# Patient Record
Sex: Male | Born: 1950
Health system: Southern US, Community
[De-identification: ages and names within clinical notes are randomized; demographics above are authoritative.]

## PROBLEM LIST (undated history)

## (undated) DIAGNOSIS — C61 Malignant neoplasm of prostate: Secondary | ICD-10-CM

## (undated) DIAGNOSIS — K219 Gastro-esophageal reflux disease without esophagitis: Secondary | ICD-10-CM

## (undated) DIAGNOSIS — B1920 Unspecified viral hepatitis C without hepatic coma: Secondary | ICD-10-CM

## (undated) DIAGNOSIS — M1990 Unspecified osteoarthritis, unspecified site: Secondary | ICD-10-CM

## (undated) DIAGNOSIS — I10 Essential (primary) hypertension: Secondary | ICD-10-CM

## (undated) HISTORY — PX: KNEE SURGERY: SHX244

---

## 1997-11-27 HISTORY — PX: BACK SURGERY: SHX140

## 2007-11-25 IMAGING — CT CT HEAD W/O CM - CT MAXILLOFACIAL W/O CM
3 series · 16 of 47 positions shown, 19 images · non-contrast
Comparison: NONE

CLINICAL DATA: Chronic sinusitis. 

CT PARANASAL SINUSES
TECHNIQUE: Multiple axial 3 mm thick slices at 3 mm intervals 
were obtained.  Sagittal and coronal reconstructions were obtained 
as well.

[Series 3: 3x3 · axial · 0.35mm/px · z∈[+974,+1076]mm · 10 of 40 slices shown, 13 images]
[im 3/40  brain]
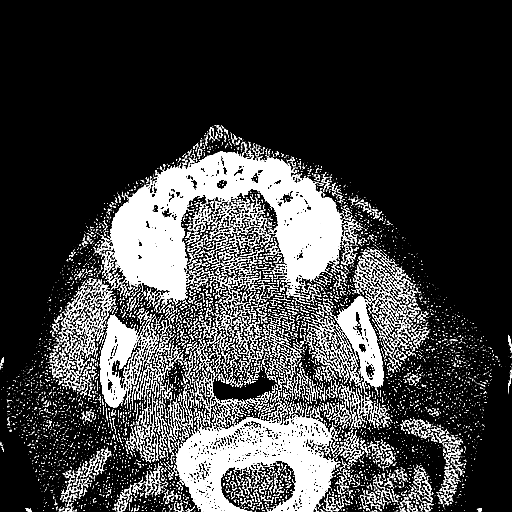
[im 3/40  bone]
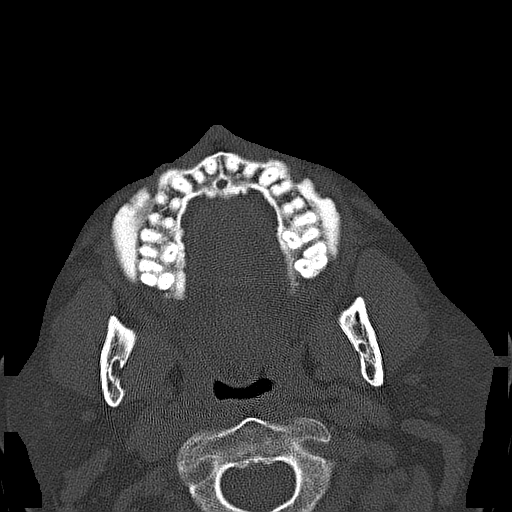
[im 7/40  bone]
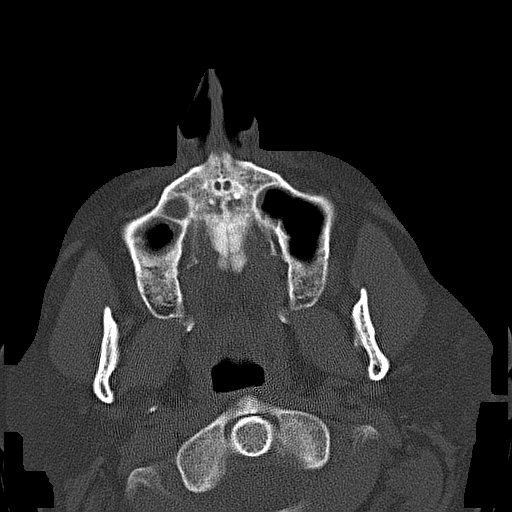
[im 10/40  bone]
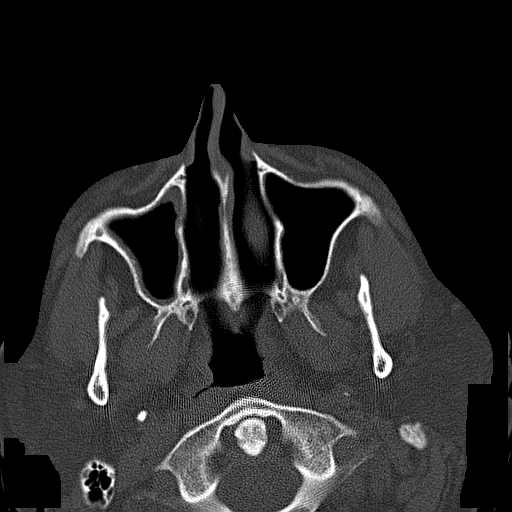
[im 14/40  bone]
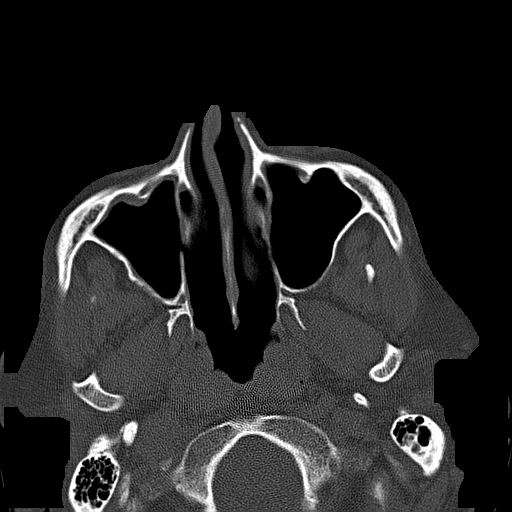
[im 18/40  brain]
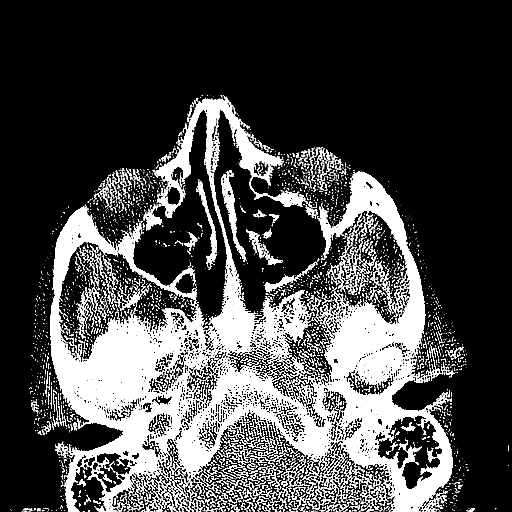
[im 18/40  bone]
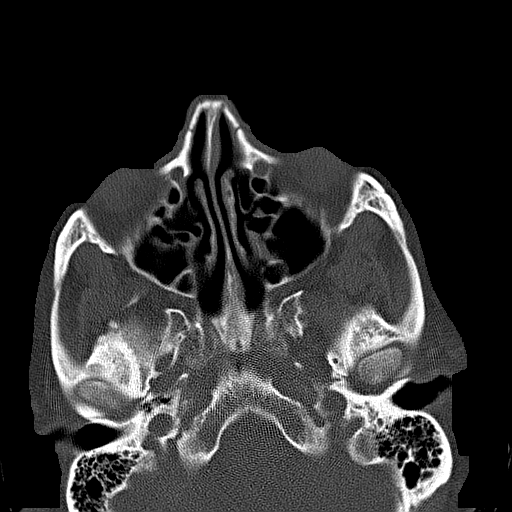
[im 21/40  bone]
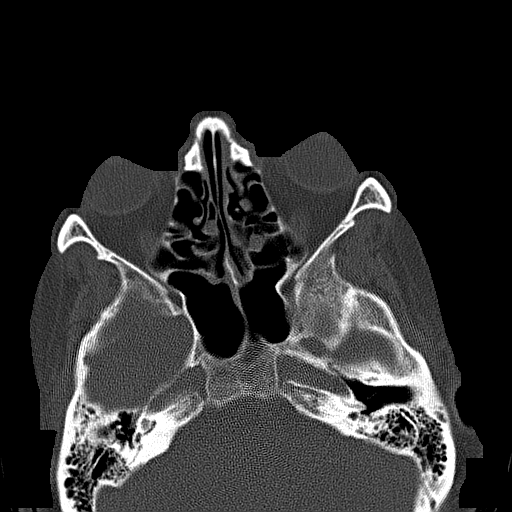
[im 25/40  bone]
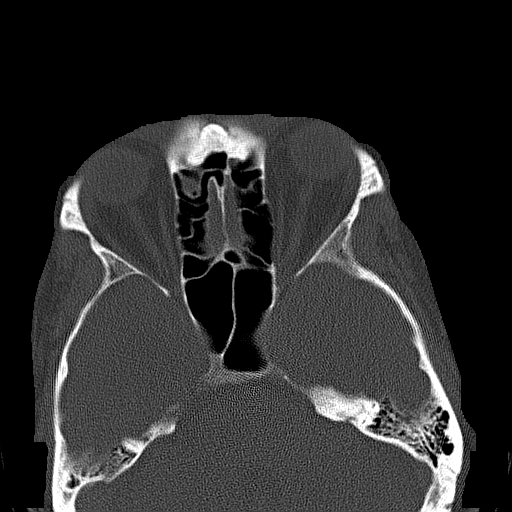
[im 29/40  bone]
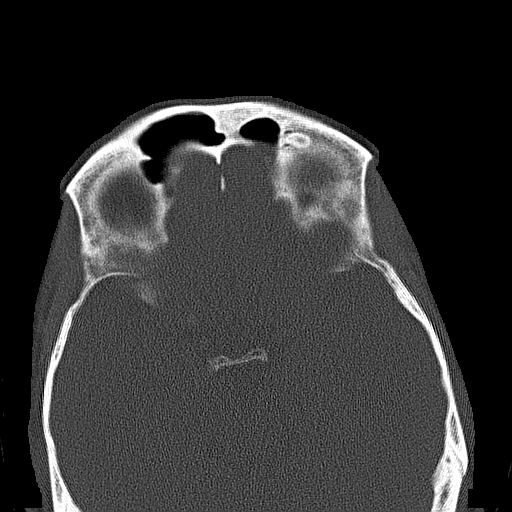
[im 33/40  brain]
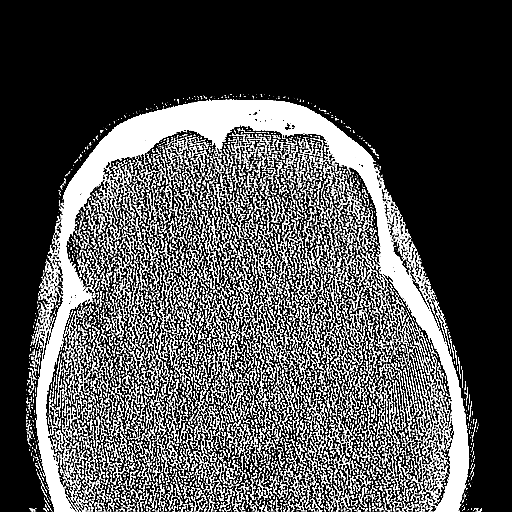
[im 33/40  bone]
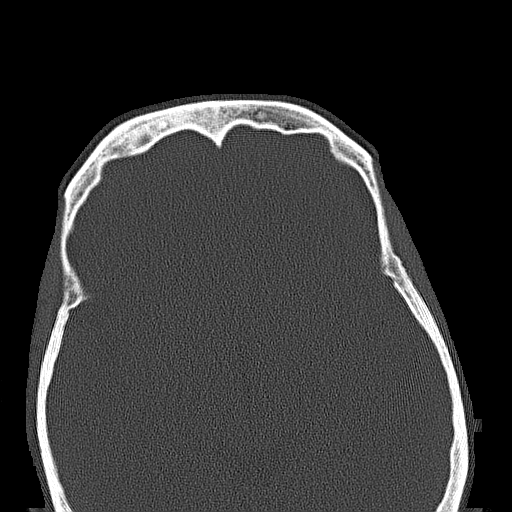
[im 37/40  bone]
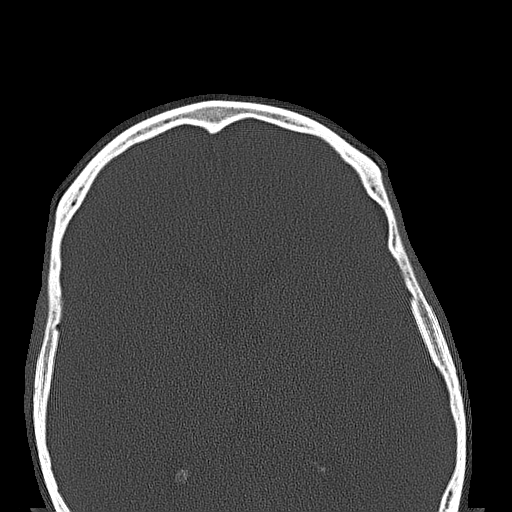

[coronals · coronal · 0.34mm/px · 3 of 33 slices shown]
[im 11/33  bone]
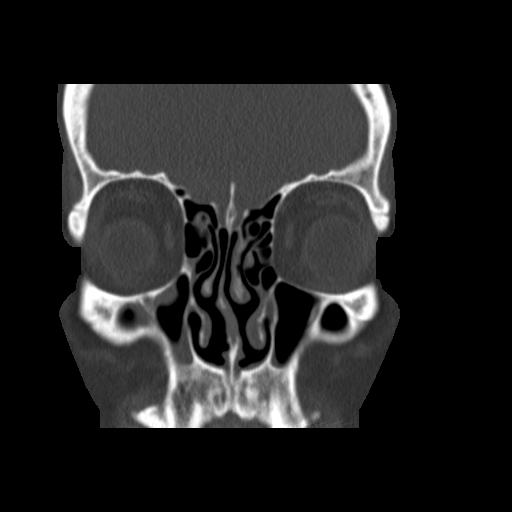
[im 15/33  bone]
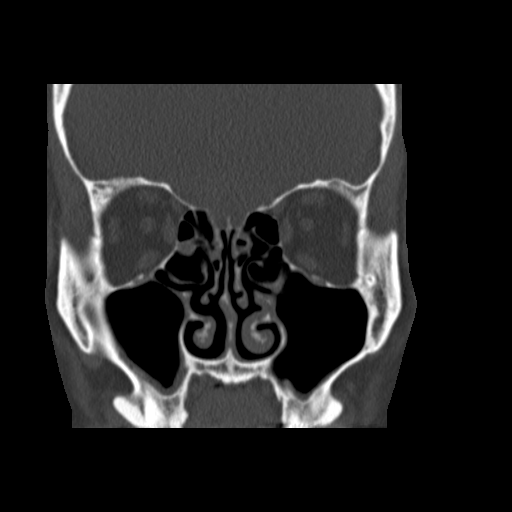
[im 18/33  bone]
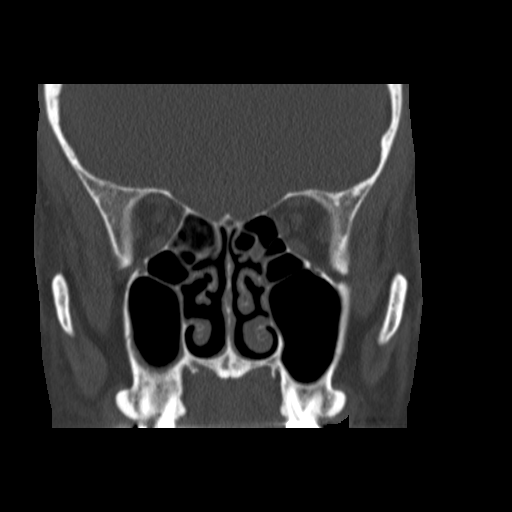

[sagittals · sagittal · 0.34mm/px · 3 of 44 slices shown]
[im 15/44  bone]
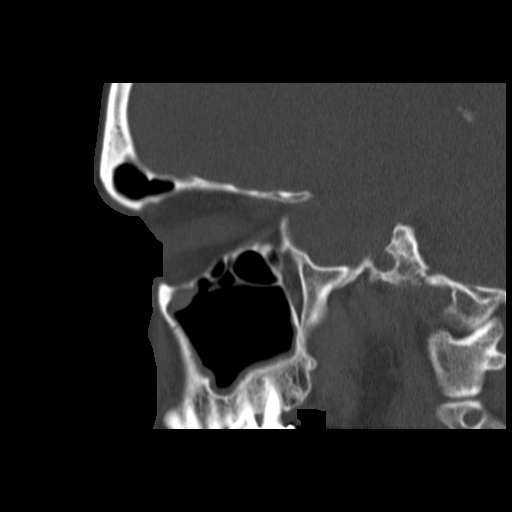
[im 22/44  bone]
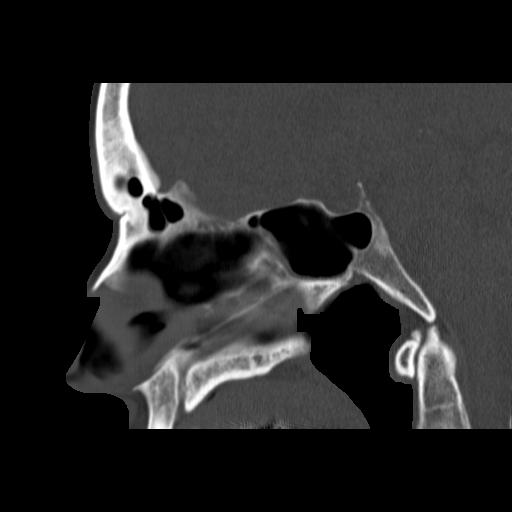
[im 29/44  bone]
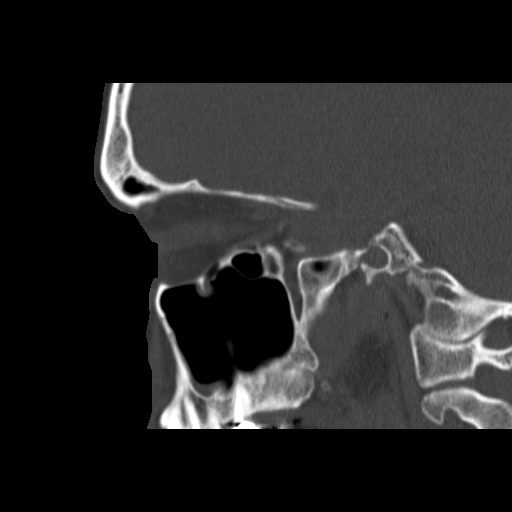

[16 of 47 positions shown; findings below may reference images not displayed]

FINDINGS: There is mild deviation of the nasal septum with the 
apex directed toward the right.  Paradoxical curvature is seen in 
both middle turbinates.  There is mild mucosal thickening noted in 
the maxillary and ethmoid sinuses.  No air-fluid levels are seen.  
No evidence of mucous retention cysts or polypys.
IMPRESSION: Deviation of the nasal septum. There appears to be an 
opening in the medial wall of the right maxillary sinus, which 
likely represents a post-surgical change. Draining infundibula of 
both maxillary sinuses are patent.  There is pneumatization of the 
left uncinate process. No air-fluid levels are seen. Suz Rys, 
Trans Date: 03/15/2007 JH  JLM

## 2012-01-15 ENCOUNTER — Telehealth (HOSPITAL_COMMUNITY): Payer: Self-pay

## 2012-01-15 NOTE — Telephone Encounter (Signed)
error 

## 2014-07-20 ENCOUNTER — Ambulatory Visit (INDEPENDENT_AMBULATORY_CARE_PROVIDER_SITE_OTHER): Payer: Self-pay | Admitting: Family Medicine

## 2014-07-20 VITALS — BP 138/80 | HR 70 | Temp 98.4°F | Resp 18 | Ht 71.0 in | Wt 197.0 lb

## 2014-07-20 DIAGNOSIS — Z0289 Encounter for other administrative examinations: Secondary | ICD-10-CM

## 2014-07-20 NOTE — Progress Notes (Signed)
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 He's having no new problems, takes no medicines, and has no complaints.  Objective: I reviewed the questionnaire which is entirely negative. Patient's physical exam is normal with respect to HEENT, neck, chest, heart, abdomen, genitalia. He has good distal pulses with no edema. His skin is clear of any infections. His vision and hearing reviewed by medical assistant are normal.  Assessment: Patient passes the exam for two-year certificate.

## 2016-07-11 ENCOUNTER — Encounter: Payer: Self-pay | Admitting: Family Medicine

## 2016-07-11 ENCOUNTER — Ambulatory Visit (INDEPENDENT_AMBULATORY_CARE_PROVIDER_SITE_OTHER): Payer: Self-pay | Admitting: Family Medicine

## 2016-07-11 VITALS — BP 136/76 | HR 92 | Temp 98.2°F | Resp 16 | Ht 71.0 in | Wt 208.4 lb

## 2016-07-11 DIAGNOSIS — Z021 Encounter for pre-employment examination: Secondary | ICD-10-CM

## 2016-07-11 DIAGNOSIS — Z024 Encounter for examination for driving license: Secondary | ICD-10-CM

## 2016-07-11 NOTE — Progress Notes (Signed)
Subjective:  By signing my name below, I, Moises Blood, attest that this documentation has been prepared under the direction and in the presence of Merri Ray, MD. Electronically Signed: Moises Blood, [place]. 07/11/2016 , 8:58 AM .  Patient was seen in Room 9 .   Patient ID: Tony Lopez, male    DOB: 1951/02/07, 65 y.o.   MRN: [id number] Chief Complaint  Patient presents with  . dot physical   HPI Tony Lopez is a 65 y.o. male Here for DOT physical. Last DOT physical was in August 2015, given a 2 year card. No medications or medical problems at that time. He denies any new changes since previous visit. He denies chest tightness, shortness of breath or chest pain with exertion. He has been informed of occasional snoring. He denies any day time somnolence. He denies history of sleep apnea. He denies any problems with bright lights or glare at night. He denies glaucoma.   He's had a ruptured disc in his back about 15 years ago. He denies any issue with his back.  He had a torn cartilage in his left knee about 20 years ago; it sometimes has more popping. He denies any weakness in his legs.   There are no active problems to display for this patient.  No past medical history on file. No past surgical history on file. No Known Allergies Prior to Admission medications   Not on File   Social History   Social History  . Marital status: Legally Separated    Spouse name: N/A  . Number of children: N/A  . Years of education: N/A   Occupational History  . Not on file.   Social History Main Topics  . Smoking status: Current Every Day Smoker    Packs/day: 1.00    Years: 35.00    Types: Cigarettes  . Smokeless tobacco: Not on file  . Alcohol use No  . Drug use: No  . Sexual activity: Not on file   Other Topics Concern  . Not on file   Social History Narrative  . No narrative on file    Review of Systems  Constitutional: Negative for fatigue and unexpected  weight change.  Eyes: Negative for visual disturbance.  Respiratory: Negative for cough, chest tightness and shortness of breath.   Cardiovascular: Negative for chest pain, palpitations and leg swelling.  Gastrointestinal: Negative for abdominal pain and blood in stool.  Neurological: Negative for dizziness, light-headedness and headaches.   DOT form reviewed, only positive for history of low back disc issue and left knee meniscus issue. No recent symptoms.    Objective:   Physical Exam  Constitutional: He is oriented to person, place, and time. He appears well-developed and well-nourished.  HENT:  Head: Normocephalic and atraumatic.  Right Ear: External ear normal.  Left Ear: External ear normal.  Mouth/Throat: Oropharynx is clear and moist.  Eyes: Conjunctivae and EOM are normal. Pupils are equal, round, and reactive to light.  Neck: Normal range of motion. Neck supple. No thyromegaly present.  Cardiovascular: Normal rate, regular rhythm, normal heart sounds and intact distal pulses.   Pulmonary/Chest: Effort normal and breath sounds normal. No respiratory distress. He has no wheezes.  Abdominal: Soft. He exhibits no distension. There is no tenderness. Hernia confirmed negative in the right inguinal area and confirmed negative in the left inguinal area.  Musculoskeletal: Normal range of motion. He exhibits no edema or tenderness.  Lymphadenopathy:    He has no cervical adenopathy.  Neurological: He is alert and oriented to person, place, and time. He has normal reflexes.  Skin: Skin is warm and dry.  Psychiatric: He has a normal mood and affect. His behavior is normal.  Vitals reviewed.    Vitals:   07/11/16 0830  BP: 136/76  Pulse: 92  Resp: 16  Temp: 98.2 F (36.8 C)  TempSrc: Oral  SpO2: 96%  Weight: 208 lb 6.4 oz (94.5 kg)  Height: 5\' 11"  (1.803 m)      Assessment & Plan:   Tony Lopez is a 65 y.o. male Encounter for commercial driver medical examination  (CDME)  - No concerning findings on history or exam. 2 year card provided. See paperwork.  No orders of the defined types were placed in this encounter.  Patient Instructions   No concerns on exam today. 2 year card. Keep routine follow-up with your primary care provider.    IF you received an x-ray today, you will receive an invoice from [facility]. Please contact [facility] at [phone] with questions or concerns regarding your invoice.   IF you received labwork today, you will receive an invoice from [employer]. Please contact [facility] at [phone] with questions or concerns regarding your invoice.   Our billing staff will not be able to assist you with questions regarding bills from these companies.  You will be contacted with the lab results as soon as they are available. The fastest way to get your results is to activate your My Chart account. Instructions are located on the last page of this paperwork. If you have not heard from [place] regarding the results in 2 weeks, please contact this office.       I personally performed the services described in this documentation, which was scribed in my presence. The recorded information has been reviewed and considered, and addended by me as needed.   Signed,   Merri Ray, MD [facility].  07/11/16 9:06 AM

## 2016-07-11 NOTE — Patient Instructions (Addendum)
No concerns on exam today. 2 year card. Keep routine follow-up with your primary care provider.    IF you received an x-ray today, you will receive an invoice from [facility]. Please contact [facility] at [phone] with questions or concerns regarding your invoice.   IF you received labwork today, you will receive an invoice from [employer]. Please contact [facility] at [phone] with questions or concerns regarding your invoice.   Our billing staff will not be able to assist you with questions regarding bills from these companies.  You will be contacted with the lab results as soon as they are available. The fastest way to get your results is to activate your My Chart account. Instructions are located on the last page of this paperwork. If you have not heard from [place] regarding the results in 2 weeks, please contact this office.

## 2017-04-11 ENCOUNTER — Encounter: Payer: Self-pay | Admitting: Radiation Oncology

## 2017-04-25 ENCOUNTER — Ambulatory Visit: Payer: Self-pay | Admitting: Radiation Oncology

## 2017-04-26 ENCOUNTER — Encounter: Payer: Self-pay | Admitting: Radiation Oncology

## 2017-04-26 NOTE — Progress Notes (Signed)
GU Location of Tumor / Histology: Prostatic Adenocarcinoma  If Prostate Cancer, Gleason Score is (3 +4 ) on 04/02/2017 and PSA is (6.04) on 01/02/2017  Tony Lopez presented to his primary pcp [place], [place], evaluated PSA found, referred to Dr. Tresa Moore for   Biopsies of prostate on 04/02/2017    Past/Anticipated interventions by urology, if any: Prostate Biopsy  04/02/2017  Past/Anticipated interventions by medical oncology, if any: none  Weight changes, if any: No  Bowel/Bladder complaints, if any: No  Nausea/Vomiting, if any: No  Pain issues, if any:  No  SAFETY ISSUES:  Prior radiation? No  Pacemaker/ICD? No  Possible current pregnancy? No  Is the patient on methotrexate? 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  Per Dr. Zettie Pho note proceed with any treatment in the Fall due to work and better insurance coverage if at all possible.  His pertinent medical history is Hepatitis C, Hypertension, Chronic Kidney Disease Stage 3.  Smokes 1 ppd

## 2017-04-30 ENCOUNTER — Encounter: Payer: Self-pay | Admitting: Radiation Oncology

## 2017-04-30 ENCOUNTER — Encounter: Payer: Self-pay | Admitting: Medical Oncology

## 2017-04-30 ENCOUNTER — Ambulatory Visit
Admission: RE | Admit: 2017-04-30 | Discharge: 2017-04-30 | Disposition: A | Discharge status: Home / Self Care | Payer: BLUE CROSS/BLUE SHIELD | Source: Ambulatory Visit | Attending: Radiation Oncology | Admitting: Radiation Oncology

## 2017-04-30 VITALS — BP 166/90 | HR 60 | Temp 98.8°F | Resp 18 | Wt 208.4 lb

## 2017-04-30 DIAGNOSIS — Z801 Family history of malignant neoplasm of trachea, bronchus and lung: Secondary | ICD-10-CM | POA: Insufficient documentation

## 2017-04-30 DIAGNOSIS — Z79899 Other long term (current) drug therapy: Secondary | ICD-10-CM | POA: Diagnosis not present

## 2017-04-30 DIAGNOSIS — F17210 Nicotine dependence, cigarettes, uncomplicated: Secondary | ICD-10-CM | POA: Insufficient documentation

## 2017-04-30 DIAGNOSIS — K7589 Other specified inflammatory liver diseases: Secondary | ICD-10-CM | POA: Diagnosis not present

## 2017-04-30 DIAGNOSIS — Z9889 Other specified postprocedural states: Secondary | ICD-10-CM | POA: Insufficient documentation

## 2017-04-30 DIAGNOSIS — M1990 Unspecified osteoarthritis, unspecified site: Secondary | ICD-10-CM | POA: Diagnosis not present

## 2017-04-30 DIAGNOSIS — I10 Essential (primary) hypertension: Secondary | ICD-10-CM | POA: Diagnosis not present

## 2017-04-30 DIAGNOSIS — C61 Malignant neoplasm of prostate: Secondary | ICD-10-CM | POA: Insufficient documentation

## 2017-04-30 DIAGNOSIS — K219 Gastro-esophageal reflux disease without esophagitis: Secondary | ICD-10-CM | POA: Insufficient documentation

## 2017-04-30 HISTORY — DX: Unspecified viral hepatitis C without hepatic coma: B19.20

## 2017-04-30 HISTORY — DX: Malignant neoplasm of prostate: C61

## 2017-04-30 HISTORY — DX: Essential (primary) hypertension: I10

## 2017-04-30 HISTORY — DX: Unspecified osteoarthritis, unspecified site: M19.90

## 2017-04-30 HISTORY — DX: Gastro-esophageal reflux disease without esophagitis: K21.9

## 2017-04-30 NOTE — Progress Notes (Signed)
Introduced myself to Mr. Kluttz as the prostate nurse navigator and my role. I gave him my business card and asked him to call me with questions and or concerns. He voiced understanding.

## 2017-04-30 NOTE — Progress Notes (Signed)
Radiation Oncology         [phone] ________________________________  Initial Outpatient Consultation  Name: Tony Lopez MRN: [id number]  Date: 04/30/2017  DOB: 03/08/1951  EX:HBZJIR, Pcp Not In  Alexis Frock, MD   REFERRING PHYSICIAN: Alexis Frock, MD  DIAGNOSIS: 66 y.o. gentleman with stage T1c adenocarcinoma of the prostate with a Gleason score of 3+4 and a PSA of 6.03    ICD-9-CM ICD-10-CM   1. Prostatic adenocarcinoma ([place]) 185 C61     HISTORY OF PRESENT ILLNESS: Tony Lopez is a 66 y.o. male He was noted to have an elevated PSA of 6.04 by his primary care physician, Nonah Mattes, [place] on 01/02/17.  Accordingly, he was referred for evaluation in urology by Dr. Tresa Moore on 04/10/17,  digital rectal examination was performed at that time revealing no nodularity.  The patient proceeded to transrectal ultrasound with 12 biopsies of the prostate on 04/02/17.  The prostate volume measured 53 cc.  Out of 12 core biopsies, 3 were positive.  The maximum Gleason score was 3+4, and this was seen in left base, left mid, and left apex.  The patient reviewed the biopsy results with his urologist and he has kindly been referred today for discussion of potential radiation treatment options.     PREVIOUS RADIATION THERAPY: No  PAST MEDICAL HISTORY:  Past Medical History:  Diagnosis Date  . Arthritis   . GERD (gastroesophageal reflux disease)   . Hepatitis C   . Hypertension   . Prostate cancer ([place])       PAST SURGICAL HISTORY: Past Surgical History:  Procedure Laterality Date  . BACK SURGERY  1999  . KNEE SURGERY Left early 90s    FAMILY HISTORY:  Family History  Problem Relation Age of Onset  . Lung cancer Mother   . Prostate cancer Neg Hx     SOCIAL HISTORY:  Social History   Social History  . Marital status: Legally Separated    Spouse name: N/A  . Number of children: N/A  . Years of education: N/A   Occupational History  . Not on file.    Social History Main Topics  . Smoking status: Current Every Day Smoker    Packs/day: 1.00    Years: 35.00    Types: Cigarettes  . Smokeless tobacco: Not on file  . Alcohol use No  . Drug use: No  . Sexual activity: Not on file   Other Topics Concern  . Not on file   Social History Narrative  . No narrative on file  The patient is divorced and lives in [place]. He works as a Administrator.  ALLERGIES: Patient has no known allergies.  MEDICATIONS:  Current Outpatient Prescriptions  Medication Sig Dispense Refill  . lisinopril-hydrochlorothiazide (PRINZIDE,ZESTORETIC) 20-12.5 MG tablet Take 1 tablet by mouth daily.    Marland Kitchen omeprazole (PRILOSEC) 20 MG capsule Take 20 mg by mouth daily.     No current facility-administered medications for this encounter.     REVIEW OF SYSTEMS:  On review of systems, the patient reports that he is doing well overall. He denies any chest pain, shortness of breath, cough, fevers, chills, night sweats, unintended weight changes. He denies any bowel or bladder disturbances, and denies abdominal pain, nausea or vomiting. He denies any new musculoskeletal or joint aches or pains. The patient has an IPSS score of 0, and reports normal erectile function. A complete review of systems is obtained and is otherwise negative.    PHYSICAL EXAM:  Wt Readings from Last 3 Encounters:  04/30/17 208 lb 6.4 oz (94.5 kg)  07/11/16 208 lb 6.4 oz (94.5 kg)  07/20/14 197 lb (89.4 kg)   Temp Readings from Last 3 Encounters:  04/30/17 98.8 F (37.1 C) (Oral)  07/11/16 98.2 F (36.8 C) (Oral)  07/20/14 98.4 F (36.9 C)   BP Readings from Last 3 Encounters:  04/30/17 (!) 166/90  07/11/16 136/76  07/20/14 138/80   Pulse Readings from Last 3 Encounters:  04/30/17 60  07/11/16 92  07/20/14 70   Pain Assessment Pain Score: 0-No pain/10  In general this is a well appearing caucasian male in no acute distress. He is alert and oriented x4 and appropriate  throughout the examination. HEENT reveals that the patient is normocephalic, atraumatic. EOMs are intact. PERRLA. Skin is intact without any evidence of gross lesions. Cardiovascular exam reveals a regular rate and rhythm, no clicks rubs or murmurs are auscultated. Chest is clear to auscultation bilaterally. Lymphatic assessment is performed and does not reveal any adenopathy in the cervical, supraclavicular, axillary, or inguinal chains. Abdomen has active bowel sounds in all quadrants and is intact. The abdomen is soft, non tender, non distended. Lower extremities are negative for pretibial pitting edema, deep calf tenderness, cyanosis or clubbing.   KPS =0  100 - Normal; no complaints; no evidence of disease. 90   - Able to carry on normal activity; minor signs or symptoms of disease. 80   - Normal activity with effort; some signs or symptoms of disease. 41   - Cares for self; unable to carry on normal activity or to do active work. 60   - Requires occasional assistance, but is able to care for most of his personal needs. 50   - Requires considerable assistance and frequent medical care. 19   - Disabled; requires special care and assistance. 85   - Severely disabled; hospital admission is indicated although death not imminent. 1   - Very sick; hospital admission necessary; active supportive treatment necessary. 10   - Moribund; fatal processes progressing rapidly. 0     - Dead  Karnofsky DA, Abelmann WH, Craver LS and Burchenal JH [phone]) The use of the nitrogen mustards in the palliative treatment of carcinoma: with particular reference to bronchogenic carcinoma Cancer 1 [phone]  LABORATORY DATA:  No results found for: WBC, HGB, HCT, MCV, PLT No results found for: NA, K, CL, CO2 No results found for: ALT, AST, GGT, ALKPHOS, BILITOT   RADIOGRAPHY: No results found.    IMPRESSION/PLAN: 1. 66 y.o. gentleman with intermediate risk T1c adenocarcinoma of the prostate with a Gleason score of  3+4 and a PSA of 6.03. The patient is counseled on the findings from his labs and pathology as well. He reviews the rationale of offering therapies based on T staging, gland size, Gleason Score and PSA. He discusses that given his situation, he would be a candidate for prostatectomy, external radiotherapy, or radioactive seed implant. He discusses the risks, benefits, short, and long term effects of each option of radiotherapy comparing and contrasting them to prostatectomy. He discusses the delivery and logistics of radiotherapy as well. At the conclusion of our discussion he's leaning more toward either brachytherapy with SpaceOAR versus prostatectomy. We will contact him in a few weeks to determine how he would like to move forward.    Carola Rhine, PAC Seen with  _____________________________________  Sheral Apley Tammi Klippel, M.D.    This document serves as a record of services personally  performed by Tyler Pita, MD and Shona Simpson, PA-C. It was created on their behalf by Bethann Humble, a trained medical scribe. The creation of this record is based on the scribe's personal observations and the provider's statements to them. This document has been checked and approved by the attending provider.

## 2017-05-15 ENCOUNTER — Telehealth: Payer: Self-pay | Admitting: Radiation Oncology

## 2017-05-15 NOTE — Telephone Encounter (Signed)
LM for the patient to call me back about treatment decision making.

## 2017-06-06 ENCOUNTER — Telehealth: Payer: Self-pay | Admitting: Radiation Oncology

## 2017-06-06 NOTE — Telephone Encounter (Addendum)
LM to ask the pt to call us regarding treatment decision making since meeting with Dr. Tresa Moore.

## 2017-09-05 ENCOUNTER — Other Ambulatory Visit: Payer: Self-pay | Admitting: Urology

## 2017-09-17 DIAGNOSIS — R278 Other lack of coordination: Secondary | ICD-10-CM | POA: Diagnosis not present

## 2017-09-17 DIAGNOSIS — C61 Malignant neoplasm of prostate: Secondary | ICD-10-CM | POA: Diagnosis not present

## 2017-09-17 DIAGNOSIS — M6281 Muscle weakness (generalized): Secondary | ICD-10-CM | POA: Diagnosis not present

## 2017-09-24 DIAGNOSIS — M6281 Muscle weakness (generalized): Secondary | ICD-10-CM | POA: Diagnosis not present

## 2017-09-24 DIAGNOSIS — M62838 Other muscle spasm: Secondary | ICD-10-CM | POA: Diagnosis not present

## 2017-09-24 DIAGNOSIS — C61 Malignant neoplasm of prostate: Secondary | ICD-10-CM | POA: Diagnosis not present

## 2017-09-24 DIAGNOSIS — N183 Chronic kidney disease, stage 3 (moderate): Secondary | ICD-10-CM | POA: Diagnosis not present

## 2017-09-24 DIAGNOSIS — N393 Stress incontinence (female) (male): Secondary | ICD-10-CM | POA: Diagnosis not present

## 2017-09-28 NOTE — Patient Instructions (Addendum)
Tony Lopez  09/28/2017   Your procedure is scheduled on: 10-05-17  Report to [facility] Main  Entrance [facility]  elevators to 3rd floor to  [place] at 9:15AM.   Call this number if you have problems the morning of surgery [phone]    Remember: ONLY 1 PERSON MAY GO WITH YOU TO SHORT STAY TO GET  READY MORNING OF [employer].  Do not eat food fter Midnight on Wednesday 10-03-17. Drink plenty of clear liquids all day Thursday 10-04-17 and follow all bowel prep instructions from your surgeon. nothing by mouth after midnight on Thursday!!     Take these medicines the morning of surgery with A SIP OF WATER: omeprazole(priliosec)                                 You may not have any metal on your body including hair pins and              piercings  Do not wear jewelry, make-up, lotions, powders or perfumes, deodorant                      Men may shave face and neck.   Do not bring valuables to the hospital. [place].  Contacts, dentures or bridgework may not be worn into surgery.  Leave suitcase in the car. After surgery it may be brought to your room.                Please read over the following fact sheets you were given: _____________________________________________________________________    CLEAR LIQUID DIET   Foods Allowed                                                                     Foods Excluded  Coffee and tea, regular and decaf                             liquids that you cannot  Plain Jell-O in any flavor                                             see through such as: Fruit ices (not with fruit pulp)                                     milk, soups, orange juice  Iced Popsicles                                    All solid food Carbonated beverages, regular and diet  Cranberry, grape and apple juices Sports drinks like  Gatorade Lightly seasoned clear broth or consume(fat free) Sugar, honey syrup  Sample Menu Breakfast                                Lunch                                     Supper Cranberry juice                    Beef broth                            Chicken broth Jell-O                                     Grape juice                           Apple juice Coffee or tea                        Jell-O                                      Popsicle                                                Coffee or tea                        Coffee or tea  _____________________________________________________________________  [facility] - Preparing for Surgery Before surgery, you can play an important role.  Because skin is not sterile, your skin needs to be as free of germs as possible.  You can reduce the number of germs on your skin by washing with CHG (chlorahexidine gluconate) soap before surgery.  CHG is an antiseptic cleaner which kills germs and bonds with the skin to continue killing germs even after washing. Please DO NOT use if you have an allergy to CHG or antibacterial soaps.  If your skin becomes reddened/irritated stop using the CHG and inform your nurse when you arrive at Short Stay. Do not shave (including legs and underarms) for at least 48 hours prior to the first CHG shower.  You may shave your face/neck. Please follow these instructions carefully:  1.  Shower with CHG Soap the night before surgery and the  morning of Surgery.  2.  If you choose to wash your hair, wash your hair first as usual with your  normal  shampoo.  3.  After you shampoo, rinse your hair and body thoroughly to remove the  shampoo.                           4.  Use CHG as you would any other liquid soap.  You can apply chg directly  to the skin and wash  Gently with a scrungie or clean washcloth.  5.  Apply the CHG Soap to your body ONLY FROM THE NECK DOWN.   Do not use on face/ open                            Wound or open sores. Avoid contact with eyes, ears mouth and genitals (private parts).                       Wash face,  Genitals (private parts) with your normal soap.             6.  Wash thoroughly, paying special attention to the area where your surgery  will be performed.  7.  Thoroughly rinse your body with warm water from the neck down.  8.  DO NOT shower/wash with your normal soap after using and rinsing off  the CHG Soap.                9.  Pat yourself dry with a clean towel.            10.  Wear clean pajamas.            11.  Place clean sheets on your bed the night of your first shower and do not  sleep with pets. Day of Surgery : Do not apply any lotions/deodorants the morning of surgery.  Please wear clean clothes to the hospital/surgery center.  FAILURE TO FOLLOW THESE INSTRUCTIONS MAY RESULT IN THE CANCELLATION OF YOUR SURGERY PATIENT SIGNATURE_________________________________  NURSE SIGNATURE__________________________________  ________________________________________________________________________

## 2017-10-01 ENCOUNTER — Encounter (HOSPITAL_COMMUNITY)
Admission: RE | Admit: 2017-10-01 | Discharge: 2017-10-01 | Disposition: A | Discharge status: Home / Self Care | Payer: BLUE CROSS/BLUE SHIELD | Source: Ambulatory Visit | Attending: Urology | Admitting: Urology

## 2017-10-01 ENCOUNTER — Encounter (HOSPITAL_COMMUNITY): Payer: Self-pay

## 2017-10-01 ENCOUNTER — Other Ambulatory Visit: Payer: Self-pay

## 2017-10-01 DIAGNOSIS — C61 Malignant neoplasm of prostate: Secondary | ICD-10-CM | POA: Diagnosis not present

## 2017-10-01 DIAGNOSIS — Z01810 Encounter for preprocedural cardiovascular examination: Secondary | ICD-10-CM | POA: Diagnosis not present

## 2017-10-01 DIAGNOSIS — Z01818 Encounter for other preprocedural examination: Secondary | ICD-10-CM | POA: Diagnosis not present

## 2017-10-01 DIAGNOSIS — I10 Essential (primary) hypertension: Secondary | ICD-10-CM | POA: Insufficient documentation

## 2017-10-01 LAB — COMPREHENSIVE METABOLIC PANEL
ALBUMIN: 4.4 g/dL (ref 3.5–5.0)
ALT: 22 U/L (ref 17–63)
AST: 18 U/L (ref 15–41)
Alkaline Phosphatase: 69 U/L (ref 38–126)
Anion gap: 9 (ref 5–15)
BILIRUBIN TOTAL: 0.7 mg/dL (ref 0.3–1.2)
BUN: 14 mg/dL (ref 6–20)
CHLORIDE: 102 mmol/L (ref 101–111)
CO2: 27 mmol/L (ref 22–32)
CREATININE: 1.31 mg/dL — AB (ref 0.61–1.24)
Calcium: 9.7 mg/dL (ref 8.9–10.3)
GFR calc Af Amer: >60 mL/min (ref >60)
GFR calc non Af Amer: 56 mL/min — ABNORMAL LOW (ref >60)
GLUCOSE: 91 mg/dL (ref 65–99)
POTASSIUM: 4.1 mmol/L (ref 3.5–5.1)
Sodium: 138 mmol/L (ref 135–145)
TOTAL PROTEIN: 7.3 g/dL (ref 6.5–8.1)

## 2017-10-01 LAB — CBC
HEMATOCRIT: 47.4 % (ref 39.0–52.0)
Hemoglobin: 17 g/dL (ref 13.0–17.0)
MCH: 33.1 pg (ref 26.0–34.0)
MCHC: 35.9 g/dL (ref 30.0–36.0)
MCV: 92.2 fL (ref 78.0–100.0)
Platelets: 300 10*3/uL (ref 150–400)
RBC: 5.14 MIL/uL (ref 4.22–5.81)
RDW: 12.1 % (ref 11.5–15.5)
WBC: 14.9 10*3/uL — ABNORMAL HIGH (ref 4.0–10.5)

## 2017-10-01 NOTE — Progress Notes (Signed)
CBC result routed via epic to Dr Alexis Frock and to patients PCP

## 2017-10-05 ENCOUNTER — Other Ambulatory Visit: Payer: Self-pay

## 2017-10-05 ENCOUNTER — Ambulatory Visit (HOSPITAL_COMMUNITY): Payer: BLUE CROSS/BLUE SHIELD | Admitting: Certified Registered Nurse Anesthetist

## 2017-10-05 ENCOUNTER — Observation Stay (HOSPITAL_COMMUNITY)
Admission: RE | Admit: 2017-10-05 | Discharge: 2017-10-07 | Disposition: A | Discharge status: Home / Self Care | Payer: BLUE CROSS/BLUE SHIELD | Source: Ambulatory Visit | Attending: Urology | Admitting: Urology

## 2017-10-05 ENCOUNTER — Encounter: Payer: Self-pay | Admitting: Medical Oncology

## 2017-10-05 ENCOUNTER — Encounter (HOSPITAL_COMMUNITY): Payer: Self-pay | Admitting: Emergency Medicine

## 2017-10-05 ENCOUNTER — Encounter (HOSPITAL_COMMUNITY)
Admission: RE | Disposition: A | Discharge status: Home / Self Care | Payer: Self-pay | Source: Ambulatory Visit | Attending: Urology

## 2017-10-05 DIAGNOSIS — K219 Gastro-esophageal reflux disease without esophagitis: Secondary | ICD-10-CM | POA: Diagnosis not present

## 2017-10-05 DIAGNOSIS — Z79899 Other long term (current) drug therapy: Secondary | ICD-10-CM | POA: Diagnosis not present

## 2017-10-05 DIAGNOSIS — M1990 Unspecified osteoarthritis, unspecified site: Secondary | ICD-10-CM | POA: Diagnosis not present

## 2017-10-05 DIAGNOSIS — B1920 Unspecified viral hepatitis C without hepatic coma: Secondary | ICD-10-CM | POA: Diagnosis not present

## 2017-10-05 DIAGNOSIS — C61 Malignant neoplasm of prostate: Secondary | ICD-10-CM | POA: Diagnosis not present

## 2017-10-05 DIAGNOSIS — I10 Essential (primary) hypertension: Secondary | ICD-10-CM | POA: Insufficient documentation

## 2017-10-05 DIAGNOSIS — F17210 Nicotine dependence, cigarettes, uncomplicated: Secondary | ICD-10-CM | POA: Diagnosis not present

## 2017-10-05 HISTORY — PX: CYSTOSCOPY WITH INJECTION: SHX1424

## 2017-10-05 HISTORY — PX: ROBOT ASSISTED LAPAROSCOPIC RADICAL PROSTATECTOMY: SHX5141

## 2017-10-05 HISTORY — PX: LYMPHADENECTOMY: SHX5960

## 2017-10-05 LAB — HEMOGLOBIN AND HEMATOCRIT, BLOOD
HCT: 43.1 % (ref 39.0–52.0)
HEMOGLOBIN: 15 g/dL (ref 13.0–17.0)

## 2017-10-05 SURGERY — PROSTATECTOMY, RADICAL, ROBOT-ASSISTED, LAPAROSCOPIC
Anesthesia: General

## 2017-10-05 MED ORDER — SULFAMETHOXAZOLE-TRIMETHOPRIM 800-160 MG PO TABS: 1.0000 | ORAL_TABLET | Freq: Two times a day (BID) | ORAL | 0 refills | Status: DC

## 2017-10-05 MED ORDER — DIPHENHYDRAMINE HCL 50 MG/ML IJ SOLN: 12.5000 mg | Freq: Four times a day (QID) | INTRAMUSCULAR | Status: DC | PRN

## 2017-10-05 MED ORDER — HYDROMORPHONE HCL 1 MG/ML IJ SOLN: 0.5000 mg | INTRAMUSCULAR | Status: DC | PRN

## 2017-10-05 MED ORDER — DIPHENHYDRAMINE HCL 12.5 MG/5ML PO ELIX: 12.5000 mg | ORAL_SOLUTION | Freq: Four times a day (QID) | ORAL | Status: DC | PRN

## 2017-10-05 MED ORDER — DEXAMETHASONE SODIUM PHOSPHATE 10 MG/ML IJ SOLN
INTRAMUSCULAR | Status: DC | PRN
  Administered 2017-10-05: 10 mg via INTRAVENOUS

## 2017-10-05 MED ORDER — ACETAMINOPHEN 500 MG PO TABS
1000.0000 mg | ORAL_TABLET | Freq: Four times a day (QID) | ORAL | Status: AC
  Administered 2017-10-05 – 2017-10-06 (×4): 1000 mg via ORAL
  Filled 2017-10-05 (×4): qty 2

## 2017-10-05 MED ORDER — MIDAZOLAM HCL 5 MG/5ML IJ SOLN
INTRAMUSCULAR | Status: DC | PRN
  Administered 2017-10-05 (×2): 1 mg via INTRAVENOUS

## 2017-10-05 MED ORDER — LIDOCAINE 2% (20 MG/ML) 5 ML SYRINGE
INTRAMUSCULAR | Status: AC
  Filled 2017-10-05: qty 5

## 2017-10-05 MED ORDER — CEFAZOLIN SODIUM-DEXTROSE 2-4 GM/100ML-% IV SOLN
2.0000 g | INTRAVENOUS | Status: AC
  Administered 2017-10-05: 2 g via INTRAVENOUS
  Filled 2017-10-05: qty 100

## 2017-10-05 MED ORDER — SODIUM CHLORIDE 0.9 % IV BOLUS (SEPSIS)
1000.0000 mL | Freq: Once | INTRAVENOUS | Status: AC
  Administered 2017-10-05: 1000 mL via INTRAVENOUS

## 2017-10-05 MED ORDER — PROPOFOL 10 MG/ML IV BOLUS
INTRAVENOUS | Status: AC
  Filled 2017-10-05: qty 20

## 2017-10-05 MED ORDER — OXYCODONE HCL 5 MG PO TABS
5.0000 mg | ORAL_TABLET | ORAL | Status: DC | PRN
  Administered 2017-10-06 – 2017-10-07 (×3): 5 mg via ORAL
  Filled 2017-10-05 (×3): qty 1

## 2017-10-05 MED ORDER — ONDANSETRON HCL 4 MG/2ML IJ SOLN
INTRAMUSCULAR | Status: AC
  Filled 2017-10-05: qty 2

## 2017-10-05 MED ORDER — SODIUM CHLORIDE 0.9 % IJ SOLN
INTRAMUSCULAR | Status: AC
  Filled 2017-10-05: qty 50

## 2017-10-05 MED ORDER — HYDROMORPHONE HCL 1 MG/ML IJ SOLN
INTRAMUSCULAR | Status: DC | PRN
  Administered 2017-10-05 (×4): 0.5 mg via INTRAVENOUS

## 2017-10-05 MED ORDER — FENTANYL CITRATE (PF) 250 MCG/5ML IJ SOLN
INTRAMUSCULAR | Status: AC
  Filled 2017-10-05: qty 5

## 2017-10-05 MED ORDER — DEXAMETHASONE SODIUM PHOSPHATE 10 MG/ML IJ SOLN
INTRAMUSCULAR | Status: AC
  Filled 2017-10-05: qty 1

## 2017-10-05 MED ORDER — FENTANYL CITRATE (PF) 100 MCG/2ML IJ SOLN
INTRAMUSCULAR | Status: AC
  Filled 2017-10-05: qty 2

## 2017-10-05 MED ORDER — MIDAZOLAM HCL 2 MG/2ML IJ SOLN
INTRAMUSCULAR | Status: AC
  Filled 2017-10-05: qty 2

## 2017-10-05 MED ORDER — HYDROMORPHONE HCL 1 MG/ML IJ SOLN
0.2500 mg | INTRAMUSCULAR | Status: DC | PRN
  Administered 2017-10-05 (×2): 0.5 mg via INTRAVENOUS

## 2017-10-05 MED ORDER — ONDANSETRON HCL 4 MG/2ML IJ SOLN: 4.0000 mg | INTRAMUSCULAR | Status: DC | PRN

## 2017-10-05 MED ORDER — SUGAMMADEX SODIUM 200 MG/2ML IV SOLN
INTRAVENOUS | Status: DC | PRN
  Administered 2017-10-05: 200 mg via INTRAVENOUS

## 2017-10-05 MED ORDER — LISINOPRIL-HYDROCHLOROTHIAZIDE 20-12.5 MG PO TABS: 1.0000 | ORAL_TABLET | Freq: Every day | ORAL | Status: DC

## 2017-10-05 MED ORDER — LIDOCAINE 2% (20 MG/ML) 5 ML SYRINGE
INTRAMUSCULAR | Status: DC | PRN
  Administered 2017-10-05: 100 mg via INTRAVENOUS

## 2017-10-05 MED ORDER — ONDANSETRON HCL 4 MG/2ML IJ SOLN
INTRAMUSCULAR | Status: DC | PRN
  Administered 2017-10-05: 4 mg via INTRAVENOUS

## 2017-10-05 MED ORDER — LISINOPRIL 20 MG PO TABS
20.0000 mg | ORAL_TABLET | Freq: Every day | ORAL | Status: DC
  Administered 2017-10-06 – 2017-10-07 (×2): 20 mg via ORAL
  Filled 2017-10-05 (×2): qty 1

## 2017-10-05 MED ORDER — LACTATED RINGERS IR SOLN
Status: DC | PRN
  Administered 2017-10-05: 1000 mL

## 2017-10-05 MED ORDER — MEPERIDINE HCL 50 MG/ML IJ SOLN: 6.2500 mg | INTRAMUSCULAR | Status: DC | PRN

## 2017-10-05 MED ORDER — PROPOFOL 10 MG/ML IV BOLUS
INTRAVENOUS | Status: DC | PRN
  Administered 2017-10-05: 150 mg via INTRAVENOUS

## 2017-10-05 MED ORDER — HYDROMORPHONE HCL 2 MG/ML IJ SOLN
INTRAMUSCULAR | Status: AC
  Filled 2017-10-05: qty 1

## 2017-10-05 MED ORDER — OXYCODONE HCL 5 MG PO TABS: 5.0000 mg | ORAL_TABLET | Freq: Once | ORAL | Status: DC | PRN

## 2017-10-05 MED ORDER — PHENYLEPHRINE HCL 10 MG/ML IJ SOLN
INTRAMUSCULAR | Status: DC | PRN
  Administered 2017-10-05 (×6): 80 ug via INTRAVENOUS
  Administered 2017-10-05: 120 ug via INTRAVENOUS

## 2017-10-05 MED ORDER — PNEUMOCOCCAL VAC POLYVALENT 25 MCG/0.5ML IJ INJ
0.5000 mL | INJECTION | INTRAMUSCULAR | Status: DC
  Filled 2017-10-05: qty 0.5

## 2017-10-05 MED ORDER — ROCURONIUM BROMIDE 50 MG/5ML IV SOSY
PREFILLED_SYRINGE | INTRAVENOUS | Status: AC
  Filled 2017-10-05: qty 5

## 2017-10-05 MED ORDER — SODIUM CHLORIDE 0.9 % IJ SOLN
INTRAMUSCULAR | Status: DC | PRN
  Administered 2017-10-05: 20 mL

## 2017-10-05 MED ORDER — FENTANYL CITRATE (PF) 100 MCG/2ML IJ SOLN
INTRAMUSCULAR | Status: DC | PRN
  Administered 2017-10-05: 100 ug via INTRAVENOUS
  Administered 2017-10-05 (×4): 50 ug via INTRAVENOUS

## 2017-10-05 MED ORDER — MAGNESIUM CITRATE PO SOLN
1.0000 | Freq: Once | ORAL | Status: DC
  Filled 2017-10-05: qty 296

## 2017-10-05 MED ORDER — LACTATED RINGERS IV SOLN
INTRAVENOUS | Status: DC
  Administered 2017-10-05 (×2): via INTRAVENOUS

## 2017-10-05 MED ORDER — OXYCODONE HCL 5 MG/5ML PO SOLN
5.0000 mg | Freq: Once | ORAL | Status: DC | PRN
  Filled 2017-10-05: qty 5

## 2017-10-05 MED ORDER — ROCURONIUM BROMIDE 50 MG/5ML IV SOSY
PREFILLED_SYRINGE | INTRAVENOUS | Status: DC | PRN
  Administered 2017-10-05: 50 mg via INTRAVENOUS
  Administered 2017-10-05 (×4): 20 mg via INTRAVENOUS

## 2017-10-05 MED ORDER — INFLUENZA VAC SPLIT HIGH-DOSE 0.5 ML IM SUSY
0.5000 mL | PREFILLED_SYRINGE | INTRAMUSCULAR | Status: DC
  Filled 2017-10-05: qty 0.5

## 2017-10-05 MED ORDER — BUPIVACAINE LIPOSOME 1.3 % IJ SUSP
20.0000 mL | Freq: Once | INTRAMUSCULAR | Status: AC
  Administered 2017-10-05: 20 mL
  Filled 2017-10-05: qty 20

## 2017-10-05 MED ORDER — HYDROCODONE-ACETAMINOPHEN 5-325 MG PO TABS: 1.0000 | ORAL_TABLET | Freq: Four times a day (QID) | ORAL | 0 refills | Status: DC | PRN

## 2017-10-05 MED ORDER — PROMETHAZINE HCL 25 MG/ML IJ SOLN: 6.2500 mg | INTRAMUSCULAR | Status: DC | PRN

## 2017-10-05 MED ORDER — DEXTROSE-NACL 5-0.45 % IV SOLN
INTRAVENOUS | Status: DC
  Administered 2017-10-05 – 2017-10-06 (×2): via INTRAVENOUS

## 2017-10-05 MED ORDER — FAMOTIDINE 20 MG PO TABS
20.0000 mg | ORAL_TABLET | Freq: Two times a day (BID) | ORAL | Status: DC
Start: 2017-10-05 — End: 2017-10-07
  Administered 2017-10-05 – 2017-10-07 (×4): 20 mg via ORAL
  Filled 2017-10-05 (×4): qty 1

## 2017-10-05 MED ORDER — HYDROMORPHONE HCL 1 MG/ML IJ SOLN
INTRAMUSCULAR | Status: AC
  Filled 2017-10-05: qty 1

## 2017-10-05 MED ORDER — HYDROCHLOROTHIAZIDE 12.5 MG PO CAPS
12.5000 mg | ORAL_CAPSULE | Freq: Every day | ORAL | Status: DC
  Administered 2017-10-06 – 2017-10-07 (×2): 12.5 mg via ORAL
  Filled 2017-10-05 (×2): qty 1

## 2017-10-05 SURGICAL SUPPLY — 73 items
ADH SKN CLS APL DERMABOND .7 (GAUZE/BANDAGES/DRESSINGS) ×2
APPLICATOR COTTON TIP 6IN STRL (MISCELLANEOUS) ×4 IMPLANT
BAG URO CATCHER STRL LF (MISCELLANEOUS) ×4 IMPLANT
CATH FOLEY 2WAY SLVR 18FR 30CC (CATHETERS) ×4 IMPLANT
CATH TIEMANN FOLEY 18FR 5CC (CATHETERS) ×4 IMPLANT
CHLORAPREP W/TINT 26ML (MISCELLANEOUS) ×4 IMPLANT
CLIP VESOLOCK LG 6/CT PURPLE (CLIP) ×8 IMPLANT
CLOTH BEACON ORANGE TIMEOUT ST (SAFETY) ×4 IMPLANT
CONT SPEC 4OZ CLIKSEAL STRL BL (MISCELLANEOUS) ×4 IMPLANT
COVER FOOTSWITCH UNIV (MISCELLANEOUS) IMPLANT
COVER SURGICAL LIGHT HANDLE (MISCELLANEOUS) ×4 IMPLANT
COVER TIP SHEARS 8 DVNC (MISCELLANEOUS) ×2 IMPLANT
COVER TIP SHEARS 8MM DA VINCI (MISCELLANEOUS) ×2
CUTTER ECHEON FLEX ENDO 45 340 (ENDOMECHANICALS) ×4 IMPLANT
DECANTER SPIKE VIAL GLASS SM (MISCELLANEOUS) ×2 IMPLANT
DERMABOND ADVANCED (GAUZE/BANDAGES/DRESSINGS) ×2
DERMABOND ADVANCED .7 DNX12 (GAUZE/BANDAGES/DRESSINGS) ×2 IMPLANT
DRAPE ARM DVNC X/XI (DISPOSABLE) ×8 IMPLANT
DRAPE COLUMN DVNC XI (DISPOSABLE) ×2 IMPLANT
DRAPE DA VINCI XI ARM (DISPOSABLE) ×8
DRAPE DA VINCI XI COLUMN (DISPOSABLE) ×2
DRAPE SURG IRRIG POUCH 19X23 (DRAPES) ×4 IMPLANT
DRSG TEGADERM 4X4.75 (GAUZE/BANDAGES/DRESSINGS) ×4 IMPLANT
ELECT REM PT RETURN 15FT ADLT (MISCELLANEOUS) ×4 IMPLANT
GAUZE SPONGE 2X2 8PLY STRL LF (GAUZE/BANDAGES/DRESSINGS) ×2 IMPLANT
GLOVE BIO SURGEON STRL SZ 6.5 (GLOVE) ×3 IMPLANT
GLOVE BIO SURGEONS STRL SZ 6.5 (GLOVE) ×1
GLOVE BIOGEL M STRL SZ7.5 (GLOVE) ×8 IMPLANT
GLOVE BIOGEL PI IND STRL 7.5 (GLOVE) ×2 IMPLANT
GLOVE BIOGEL PI INDICATOR 7.5 (GLOVE) ×2
GOWN STRL REUS W/TWL LRG LVL3 (GOWN DISPOSABLE) ×12 IMPLANT
GOWN STRL REUS W/TWL XL LVL3 (GOWN DISPOSABLE) ×4 IMPLANT
HOLDER FOLEY CATH W/STRAP (MISCELLANEOUS) ×4 IMPLANT
IRRIG SUCT STRYKERFLOW 2 WTIP (MISCELLANEOUS) ×4
IRRIGATION SUCT STRKRFLW 2 WTP (MISCELLANEOUS) ×2 IMPLANT
IV LACTATED RINGERS 1000ML (IV SOLUTION) ×4 IMPLANT
KIT PROCEDURE DA VINCI SI (MISCELLANEOUS) ×2
KIT PROCEDURE DVNC SI (MISCELLANEOUS) ×2 IMPLANT
MANIFOLD NEPTUNE II (INSTRUMENTS) ×4 IMPLANT
NDL INSUFFLATION 14GA 120MM (NEEDLE) ×2 IMPLANT
NDL SPNL 22GX7 QUINCKE BK (NEEDLE) ×2 IMPLANT
NEEDLE INSUFFLATION 14GA 120MM (NEEDLE) ×4 IMPLANT
NEEDLE SPNL 22GX7 QUINCKE BK (NEEDLE) ×4 IMPLANT
PACK CYSTO (CUSTOM PROCEDURE TRAY) ×4 IMPLANT
PACK ROBOT UROLOGY CUSTOM (CUSTOM PROCEDURE TRAY) ×4 IMPLANT
PAD POSITIONING PINK XL (MISCELLANEOUS) ×2 IMPLANT
PORT ACCESS TROCAR AIRSEAL 12 (TROCAR) ×2 IMPLANT
PORT ACCESS TROCAR AIRSEAL 5M (TROCAR) ×2
RELOAD STAPLE 45 4.1 GRN THCK (STAPLE) ×2 IMPLANT
SEAL CANN UNIV 5-8 DVNC XI (MISCELLANEOUS) ×8 IMPLANT
SEAL XI 5MM-8MM UNIVERSAL (MISCELLANEOUS) ×8
SET TRI-LUMEN FLTR TB AIRSEAL (TUBING) ×4 IMPLANT
SOLUTION ELECTROLUBE (MISCELLANEOUS) ×4 IMPLANT
SPONGE GAUZE 2X2 STER 10/PKG (GAUZE/BANDAGES/DRESSINGS)
SPONGE LAP 4X18 X RAY DECT (DISPOSABLE) ×4 IMPLANT
STAPLE RELOAD 45 GRN (STAPLE) ×2 IMPLANT
STAPLE RELOAD 45MM GREEN (STAPLE) ×4
SUT ETHILON 3 0 PS 1 (SUTURE) ×4 IMPLANT
SUT MNCRL AB 4-0 PS2 18 (SUTURE) ×8 IMPLANT
SUT PDS AB 1 CT1 27 (SUTURE) ×8 IMPLANT
SUT VIC AB 2-0 SH 27 (SUTURE) ×4
SUT VIC AB 2-0 SH 27X BRD (SUTURE) ×2 IMPLANT
SUT VICRYL 0 UR6 27IN ABS (SUTURE) ×4 IMPLANT
SUT VLOC BARB 180 ABS3/0GR12 (SUTURE) ×12
SUTURE VLOC BRB 180 ABS3/0GR12 (SUTURE) ×6 IMPLANT
SYR 27GX1/2 1ML LL SAFETY (SYRINGE) ×4 IMPLANT
SYR CONTROL 10ML LL (SYRINGE) IMPLANT
TOWEL OR 17X26 10 PK STRL BLUE (TOWEL DISPOSABLE) ×4 IMPLANT
TOWEL OR NON WOVEN STRL DISP B (DISPOSABLE) ×4 IMPLANT
TUBING CONNECTING 10 (TUBING) IMPLANT
TUBING CONNECTING 10' (TUBING)
WATER STERILE IRR 1000ML POUR (IV SOLUTION) ×8 IMPLANT
WATER STERILE IRR 3000ML UROMA (IV SOLUTION) ×2 IMPLANT

## 2017-10-05 NOTE — Discharge Instructions (Signed)

## 2017-10-05 NOTE — Brief Op Note (Signed)
10/05/2017  1:46 PM  PATIENT:  Tony Lopez  66 y.o. male  PRE-OPERATIVE DIAGNOSIS:  PROSTATE CANCER  POST-OPERATIVE DIAGNOSIS:  PROSTATE CANCER  PROCEDURE:  Procedure(s): XI ROBOTIC ASSISTED LAPAROSCOPIC RADICAL PROSTATECTOMY (N/A) PELVIC LYMPHADENECTOMY (Bilateral) CYSTOSCOPY WITH INJECTION INDOCYANINE GREEN DYE (N/A)  SURGEON:  Surgeon(s) and Role:    Alexis Frock, MD - Primary  PHYSICIAN ASSISTANT:   ASSISTANTS: Clemetine Marker PA   ANESTHESIA:   local and general  EBL:  100 mL   BLOOD ADMINISTERED:none  DRAINS: 1 - JP to bulb, 2 - Foley to gravity   LOCAL MEDICATIONS USED:  MARCAINE     SPECIMEN:  Source of Specimen:  1 - prostatectomy, 2-pelvic lymph nodes, 3 - periprostatic fat  DISPOSITION OF SPECIMEN:  PATHOLOGY  COUNTS:  YES  TOURNIQUET:  * No tourniquets in log *  DICTATION: .Other Dictation: Dictation Number  [id number]  PLAN OF CARE: Admit for overnight observation  PATIENT DISPOSITION:  PACU - hemodynamically stable.   Delay start of Pharmacological VTE agent (>24hrs) due to surgical blood loss or risk of bleeding: yes

## 2017-10-05 NOTE — H&P (Signed)
Tony Lopez is an 66 y.o. male.    Chief Complaint: Pre-op Prostatectomy  HPI:   1 - Moderate Risk Prostate Cancer -Gleason 3+4=7 in up to 60% of 3 cores LMB, LMM, LMA by biopsy 03/2017 on eval rising PSA to 6.03. TRUS 47mL with tiny median lobe.     PMH sig for HTN, Hepatitis. 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 His PCP is [employer] PA with [employer].   Today " Marya Amsler " is seen to proceed with prostatecotmy.    Past Medical History:  Diagnosis Date  . Arthritis   . GERD (gastroesophageal reflux disease)   . Hepatitis C   . Hypertension   . Prostate cancer [facility])     Past Surgical History:  Procedure Laterality Date  . BACK SURGERY  1999  . KNEE SURGERY Left early 70s   CARTILAGE CLEAN OUT     Family History  Problem Relation Age of Onset  . Lung cancer Mother   . Prostate cancer Neg Hx    Social History:  reports that he has been smoking cigarettes.  He has a 35.00 pack-year smoking history. he has never used smokeless tobacco. He reports that he drinks alcohol. He reports that he does not use drugs.  Allergies: No Known Allergies  No medications prior to admission.    No results found for this or any previous visit (from the past 48 hour(s)). No results found.  Review of Systems  Constitutional: Negative.  Negative for chills and fever.  HENT: Negative.   Eyes: Negative.   Respiratory: Negative.   Cardiovascular: Negative.   Gastrointestinal: Negative.   Genitourinary: Negative.   Musculoskeletal: Negative.   Skin: Negative.   Neurological: Negative.   Endo/Heme/Allergies: Negative.   Psychiatric/Behavioral: Negative.     There were no vitals taken for this visit. Physical Exam  Constitutional: He appears well-developed.  HENT:  Head: Normocephalic.  Eyes: Pupils are equal, round, and reactive to light.  Neck: Normal range of motion.  Cardiovascular: Normal rate.  Respiratory:  Effort normal.  GI: Soft.  Genitourinary:  Genitourinary Comments: No CVAT  Musculoskeletal: Normal range of motion.  Neurological: He is alert.  Skin: Skin is warm.  Psychiatric: He has a normal mood and affect.     Assessment/Plan  Proceed as planned with prostatectomy + dye injection + node dissetion. Risks, benefits, alternatives, expected peri-op course discussed previously and reiterated today.   Alexis Frock, MD 10/05/2017, 6:22 AM

## 2017-10-05 NOTE — Anesthesia Procedure Notes (Signed)
Procedure Name: Intubation Date/Time: 10/05/2017 11:10 AM Performed by: West Pugh, CRNA Pre-anesthesia Checklist: Patient identified, Emergency Drugs available, Suction available, Patient being monitored and Timeout performed Patient Re-evaluated:Patient Re-evaluated prior to induction Oxygen Delivery Method: Circle system utilized Preoxygenation: Pre-oxygenation with 100% oxygen Induction Type: IV induction Ventilation: Mask ventilation without difficulty Laryngoscope Size: Mac and 4 Grade View: Grade II Tube type: Oral Tube size: 8.0 mm Number of attempts: 1 Airway Equipment and Method: Stylet Placement Confirmation: ETT inserted through vocal cords under direct vision,  positive ETCO2,  CO2 detector and breath sounds checked- equal and bilateral Secured at: 22 cm Tube secured with: Tape Dental Injury: Teeth and Oropharynx as per pre-operative assessment

## 2017-10-05 NOTE — Anesthesia Preprocedure Evaluation (Signed)
Anesthesia Evaluation  Patient identified by MRN, date of birth, ID band Patient awake    Reviewed: Allergy & Precautions, NPO status , Patient's Chart, lab work & pertinent test results  Airway Mallampati: II  TM Distance: >3 FB Neck ROM: Full    Dental no notable dental hx.    Pulmonary neg pulmonary ROS, Current Smoker,    Pulmonary exam normal breath sounds clear to auscultation       Cardiovascular hypertension, Pt. on medications negative cardio ROS Normal cardiovascular exam Rhythm:Regular Rate:Normal     Neuro/Psych negative neurological ROS  negative psych ROS   GI/Hepatic negative GI ROS, Neg liver ROS, GERD  ,(+) Hepatitis -  Endo/Other  negative endocrine ROS  Renal/GU negative Renal ROS  negative genitourinary   Musculoskeletal negative musculoskeletal ROS (+) Arthritis , Osteoarthritis,    Abdominal   Peds negative pediatric ROS (+)  Hematology negative hematology ROS (+)   Anesthesia Other Findings Prostate cancer  Reproductive/Obstetrics negative OB ROS                             Anesthesia Physical Anesthesia Plan  ASA: III  Anesthesia Plan: General   Post-op Pain Management:    Induction: Intravenous  PONV Risk Score and Plan: 1 and Ondansetron  Airway Management Planned: Oral ETT  Additional Equipment:   Intra-op Plan:   Post-operative Plan: Extubation in OR  Informed Consent: I have reviewed the patients History and Physical, chart, labs and discussed the procedure including the risks, benefits and alternatives for the proposed anesthesia with the patient or authorized representative who has indicated his/her understanding and acceptance.   Dental advisory given  Plan Discussed with: CRNA  Anesthesia Plan Comments:         Anesthesia Quick Evaluation

## 2017-10-05 NOTE — Transfer of Care (Signed)
Immediate Anesthesia Transfer of Care Note  Patient: Tony Lopez  Procedure(s) Performed: XI ROBOTIC ASSISTED LAPAROSCOPIC RADICAL PROSTATECTOMY (N/A ) PELVIC LYMPHADENECTOMY (Bilateral ) CYSTOSCOPY WITH INJECTION INDOCYANINE GREEN DYE (N/A )  Patient Location: PACU  Anesthesia Type:General  Level of Consciousness: awake, sedated, patient cooperative and responds to stimulation  Airway & Oxygen Therapy: Patient Spontanous Breathing and Patient connected to face mask oxygen  Post-op Assessment: Report given to RN, Post -op Vital signs reviewed and stable and Patient moving all extremities X 4  Post vital signs: Reviewed and stable  Last Vitals:  Vitals:   10/05/17 0847  BP: (!) 153/82  Pulse: 96  Resp: 18  Temp: 36.5 C  SpO2: 100%    Last Pain:  Vitals:   10/05/17 0847  TempSrc: Oral      Patients Stated Pain Goal: 3 (03/70/48 8891)  Complications: No apparent anesthesia complications

## 2017-10-06 DIAGNOSIS — Z79899 Other long term (current) drug therapy: Secondary | ICD-10-CM | POA: Diagnosis not present

## 2017-10-06 DIAGNOSIS — B1920 Unspecified viral hepatitis C without hepatic coma: Secondary | ICD-10-CM | POA: Diagnosis not present

## 2017-10-06 DIAGNOSIS — K219 Gastro-esophageal reflux disease without esophagitis: Secondary | ICD-10-CM | POA: Diagnosis not present

## 2017-10-06 DIAGNOSIS — I10 Essential (primary) hypertension: Secondary | ICD-10-CM | POA: Diagnosis not present

## 2017-10-06 DIAGNOSIS — F17210 Nicotine dependence, cigarettes, uncomplicated: Secondary | ICD-10-CM | POA: Diagnosis not present

## 2017-10-06 DIAGNOSIS — C61 Malignant neoplasm of prostate: Secondary | ICD-10-CM | POA: Diagnosis not present

## 2017-10-06 LAB — BASIC METABOLIC PANEL
Anion gap: 8 (ref 5–15)
BUN: 12 mg/dL (ref 6–20)
CHLORIDE: 104 mmol/L (ref 101–111)
CO2: 24 mmol/L (ref 22–32)
CREATININE: 1.02 mg/dL (ref 0.61–1.24)
Calcium: 8.6 mg/dL — ABNORMAL LOW (ref 8.9–10.3)
GFR calc Af Amer: >60 mL/min (ref >60)
GFR calc non Af Amer: >60 mL/min (ref >60)
Glucose, Bld: 138 mg/dL — ABNORMAL HIGH (ref 65–99)
Potassium: 3.8 mmol/L (ref 3.5–5.1)
SODIUM: 136 mmol/L (ref 135–145)

## 2017-10-06 LAB — HEMOGLOBIN AND HEMATOCRIT, BLOOD
HCT: 39 % (ref 39.0–52.0)
HEMOGLOBIN: 13.6 g/dL (ref 13.0–17.0)

## 2017-10-06 NOTE — Progress Notes (Signed)
Pt passing flatus. ABD soft . Foley 1250cc clear yellow urine  JP 70 cc  Will cont to monitor  SRP, RN

## 2017-10-06 NOTE — Plan of Care (Signed)
  Health Behavior/Discharge Planning: Ability to manage health-related needs will improve 10/06/2017 0301 - Progressing by Jamse Mead, RN   Clinical Measurements: Ability to maintain clinical measurements within normal limits will improve 10/06/2017 0301 - Progressing by Jamse Mead, RN

## 2017-10-06 NOTE — Plan of Care (Signed)
  Progressing Health Behavior/Discharge Planning: Ability to manage health-related needs will improve 10/06/2017 2337 - Progressing by Talbert Forest, RN Clinical Measurements: Will remain free from infection 10/06/2017 2337 - Progressing by Talbert Forest, RN Pain Managment: General experience of comfort will improve 10/06/2017 2337 - Progressing by Talbert Forest, RN Safety: Ability to remain free from injury will improve 10/06/2017 2337 - Progressing by Talbert Forest, RN Education: Knowledge of the prescribed therapeutic regimen will improve 10/06/2017 2337 - Progressing by Talbert Forest, RN Pain Management: General experience of comfort will improve 10/06/2017 2337 - Progressing by Talbert Forest, RN Urinary Elimination: Ability to achieve and maintain urine output will improve 10/06/2017 2337 - Progressing by Talbert Forest, RN

## 2017-10-06 NOTE — Progress Notes (Addendum)
Pt awake and alert this am, decreased BS noted, has not passed flatus or BM since surgery, MD ordered regular Diet, asked pt to sips on clears and AMBULATE. Incentive spirometry 2500--congested cough up phelgm. JP 10 cc, foley clear yellow  600cc noted. SCDs on pt  tol well. Will cont to reassess and cont PLAN OF CARE. SRP, RN

## 2017-10-06 NOTE — Progress Notes (Signed)
Pt ambulated 2 lapse in the hall tol well, denies nausea, tol clear to full liq diet this am, will cont to monitor. SRP,RN

## 2017-10-06 NOTE — Plan of Care (Signed)
Cont plan of care

## 2017-10-07 DIAGNOSIS — C61 Malignant neoplasm of prostate: Secondary | ICD-10-CM | POA: Diagnosis not present

## 2017-10-07 DIAGNOSIS — K219 Gastro-esophageal reflux disease without esophagitis: Secondary | ICD-10-CM | POA: Diagnosis not present

## 2017-10-07 DIAGNOSIS — B1920 Unspecified viral hepatitis C without hepatic coma: Secondary | ICD-10-CM | POA: Diagnosis not present

## 2017-10-07 DIAGNOSIS — F17210 Nicotine dependence, cigarettes, uncomplicated: Secondary | ICD-10-CM | POA: Diagnosis not present

## 2017-10-07 DIAGNOSIS — I10 Essential (primary) hypertension: Secondary | ICD-10-CM | POA: Diagnosis not present

## 2017-10-07 DIAGNOSIS — Z79899 Other long term (current) drug therapy: Secondary | ICD-10-CM | POA: Diagnosis not present

## 2017-10-07 NOTE — Progress Notes (Signed)
JP d/c'd end of tube noted, pt tol procedure. JP drainage 15 cc serosanguinous. Sterile dressing applied and instructional care of site taught to pt. SRP, RN

## 2017-10-07 NOTE — Progress Notes (Signed)
Pt discharged to home, home care teaching complete acknowledge understanding, booklet reviewd with pt and questions addressed. SRP ,RN

## 2017-10-07 NOTE — Plan of Care (Signed)
  Progressing Health Behavior/Discharge Planning: Ability to manage health-related needs will improve 10/07/2017 1254 - Progressing by Zadie Rhine, RN Pain Managment: General experience of comfort will improve 10/07/2017 1254 - Progressing by Zadie Rhine, RN Education: Knowledge of the prescribed therapeutic regimen will improve 10/07/2017 1254 - Progressing by Zadie Rhine, RN Health Behavior/Discharge Planning: Identification of resources available to assist in meeting health care needs will improve 10/07/2017 1254 - Progressing by Zadie Rhine, RN Pain Management: General experience of comfort will improve 10/07/2017 1254 - Progressing by Zadie Rhine, RN Urinary Elimination: Ability to achieve and maintain urine output will improve 10/07/2017 1254 - Progressing by Zadie Rhine, RN Home care management will improve 10/07/2017 1254 - Progressing by Zadie Rhine, RN

## 2017-10-07 NOTE — Anesthesia Postprocedure Evaluation (Signed)
Anesthesia Post Note  Patient: Tony Lopez  Procedure(s) Performed: XI ROBOTIC ASSISTED LAPAROSCOPIC RADICAL PROSTATECTOMY (N/A ) PELVIC LYMPHADENECTOMY (Bilateral ) CYSTOSCOPY WITH INJECTION INDOCYANINE GREEN DYE (N/A )     Anesthesia Post Evaluation  Last Vitals:  Vitals:   10/07/17 0216 10/07/17 0650  BP: 111/68 130/75  Pulse: 68 66  Resp: 16 18  Temp: 36.9 C 36.8 C  SpO2: 95% 95%    Last Pain:  Vitals:   10/07/17 0900  TempSrc:   PainSc: Alpha

## 2017-10-08 NOTE — Op Note (Signed)
NAMEJADYN, BARGE NO.:  [id number]  MEDICAL RECORD NO.:  [id number]  LOCATION:                                 FACILITY:  PHYSICIAN:  Alexis Frock, MD          DATE OF BIRTH:  DATE OF PROCEDURE:  10/05/2017                              OPERATIVE REPORT   DIAGNOSIS:  Moderate risk prostate cancer.  PROCEDURES: 1. Robotic-assisted laparoscopic radical prostatectomy. 2. Injection of indocyanine green dye for sentinel lymphangiography. 3. Bilateral pelvic lymphadenectomy.  ASSISTANT:  Debbrah Alar, PA  ESTIMATED BLOOD LOSS:  100 mL.  SPECIMENS: 1. Radical prostatectomy. 2. Periprostatic fat. 3. Right external iliac lymph nodes. 4. Right obturator lymph nodes. 5. Left external iliac lymph node, sentinel. 6. Left obturator lymph nodes.  FINDINGS:  Sentinel lymph nodes noted in the proximal portion of the left external iliac packet otherwise unremarkable.  INDICATION:  Mr. Preis is a very pleasant and vigorous 66 year old gentleman, who was found on workup of elevated PSA to have adenocarcinoma of the prostate, moderate risk, more on the left side, all left-sided disease.  Options were discussed for primary therapy including ablative therapies versus surgical extirpation versus surveillance protocols and he wished to proceed with prostatectomy. Informed consent was obtained and placed in the medical record.  PROCEDURE IN DETAIL:  The patient being Malichi Palardy, was verified.  Procedure being radical prostatectomy was confirmed. Procedure was carried out.  Time-out was performed.  Intravenous antibiotics were administered.  General endotracheal anesthesia was introduced.  The patient was placed into a low lithotomy position. Sterile field was created by prepping and draping the patient's penis, perineum and proximal thighs using iodine and his infra-xiphoid abdomen using chlorhexidine gluconate.  He was further fashioned to the operating  table using 3-inch tape over foam padding across the supraxiphoid chest.  A test of steep Trendelenburg positioning was performed and was found to be suitably positioned.  Foley catheter was placed per urethra to straight drain.  A high-flow, low-pressure pneumoperitoneum was obtained using Veress technique in the infraumbilical midline having passed the aspiration and drop test.  An 8- mm Robotic camera port was then placed into the same location. Laparoscopic examination of the peritoneal cavity revealed no significant adhesions and no visceral injury.  Additional ports were then placed as follows:  Right paramedian 8-mm robotic port, right paramedian 5-mm assistant port, right far lateral 12-mm AirSeal assistant port, left paramedian 8-mm robotic port, left far lateral 8-mm robotic port.  Robot was docked and passed through the electronic checks.  Initial attention was directed at development of the space of Retzius.  Incision was made lateral to the left medial umbilical ligament from the midline towards the area of the internal ring coursing along the iliac vessels towards the area of the left ureter, which was positively identified.  The left vas deferens was purposely ligated using medial bucket-handle and the left bladder was separated away from the pelvic sidewall towards the area of the endopelvic fascia on the left side.  A mirror-imaged dissection was performed on the right side freeing the right lateral bladder away from the pelvic sidewall towards the area of the endopelvic fascia.  Anterior attachments were taken down using cautery scissors exposing the anterior base of the prostate, which was then defatted to better allow demarcation of the bladder neck.  This area of fat was set aside, labeled as periprostatic fat.  Next, 0.2 mL of indocyanine green dye was injected using a percutaneously-placed, robotically-guided spinal needle into the left lobe of the prostate,  and additional 0.2 mL at the right lobe with intervening suctioning to help prevent dye spillage.  Next, the endopelvic fascia was identified on both sides and carefully swept away from the lateral aspect of the prostate in a base-to-apex orientation to expose the dorsal venous complex, which was carefully controlled using stapler, taking exquisite care to avoid membranous urethral injury, which did not occur.  It has been approximately 10 minutes post dye injection and then pelvis was inspected under near-infrared fluorescence light.  Sentinel lymphangiography revealed lymphatic channels coursing over the prostate towards the pelvic lymph node fields bilaterally.  In the right pelvic lymph node fields, there were no obvious sentinel lymph nodes. On the left side, there was a single dominant area of hyperfluorescence corresponding to the more proximal aspect of the external iliac lymph nodes.  As such, standard template lymphadenectomy was performed on the right side, first in the right external lymph nodes with confines being right external iliac artery, vein, pelvic side wall, iliac bifurcation. Lymphostasis was achieved with cold clips, set aside, labeled right external iliac lymph nodes.  Next, right obturator group was dissected with confines being right obturator nerve, pelvic side wall, external iliac vein.  Lymphostasis was achieved with cold clips, was labeled right obturator lymph nodes.  The obturator nerve was inspected during and following these maneuvers and found to be uninjured.  A mirror- imaged lymphadenectomy was performed on the left side of the left external iliac and left obturator groups respectively and the left external iliac group was set aside as sentinel.  There was an additional area that appeared to be likely previously contiguous with this sentinel lymph node that resided in the perivesical location.  There was a questionable small node at this site and  this was also set aside, labeled as left perivesical lymph node, sentinel.  Attention was then directed at bladder neck dissection.  The lateral release was performed to better denote the demarcation of the bladder neck and prostate, which was then carefully released from the prostate to anterior-posterior direction keeping what appeared to be a rim of circular muscle fibers with this, the bladder neck and prostatectomy dissections.  Posterior dissection was performed by incising approximately 7 mm inferior- posterior to the posterior lip of the prostate entering the plane of Denonvilliers.  Bilateral seminal vesicles were dissected to approximately 4 cm, ligated and placed on gentle superior traction. Bilateral seminal vesicles were dissected to their tips, placed on gentle superior traction.  Dissection proceeded inferiorly within this plane towards the area of the prostatic apex, this exposed the vascular pedicles bilaterally.  These were controlled using sequential clipping technique on the right side performing aggressive nerve sparing on the right.  This was not the side with this tumor.  On the left side, a purposeful wide dissection was performed given this was the side of his known tumor.  Final apical dissection was performed in the anterior plane placing the prostate in a superior traction.  The membranous urethra was coldly transected, this completely freed up the prostatectomy specimens, which was placed in an EndoCatch bag for later retrieval.  Digital rectal exam  was then performed with indicator glove and no evidence of rectal violation was noted.  Next, posterior dissection was performed using a running 3-0 V-Loc suture reapproximating the posterior urethral plate, the posterior bladder neck bringing these structures in a tension-free apposition.  Next, final mucosa-mucosa anastomosis was performed using double-armed 3-0 V-Loc suture reapproximating the membranous urethra  to the bladder neck.  This resulted in excellent mucosal apposition.  A new Foley catheter was then placed per urethra, which irrigated quantitatively.  All sponge and needle counts were correct.  Hemostasis appeared excellent.  A closed suction drain was brought through the previous left lateral most robotic port site to the area of the peritoneal cavity.  The previous right lateral most 12-mm site was closed at the level of the fascia using Carter-Thomason suture passer under laparoscopic vision.  Specimen was retrieved by extending the previous camera port site inferiorly for distance approximately 4 cm, removing the prostatectomy specimen, setting aside for permanent pathology.  This site was closed at the level of the fascia using figure-of-eight PDS x3 followed by reapproximation of Scarpa's with running Vicryl.  All incision sites were infiltrated with dilute lyophilized Marcaine and then closed at the level of the skin using subcuticular Monocryl followed by Dermabond. Procedure was then terminated.  The patient tolerated the procedure well.  There were no immediate periprocedural complications.  The patient was taken to the postanesthesia care unit in stable condition.  Please note, first assistant, Debbrah Alar, was absolutely crucial for all robotic portions of the procedure today.  She provided invaluable retraction, suctioning, vascular clip application, retraction, without which, this would not be possible.    ______________________________ Alexis Frock, MD   ______________________________ Alexis Frock, MD    TM/MEDQ  D:  10/05/2017  T:  10/05/2017  Job:  [id number]

## 2017-10-15 NOTE — Discharge Summary (Addendum)
Physician Discharge Summary  Patient ID: Tony Lopez MRN: [id number] DOB/AGE: Sep 13, 1951 66 y.o.  Admit date: 10/05/2017 Discharge date: 10/07/2017  Admission Diagnoses:  prostate cancer Discharge Diagnoses:  Active Problems:   Prostate cancer [facility])   Discharged Condition: good  Hospital Course: The patient tolerated the procedure well and was transferred to the floor on IV pain meds, IV fluid. On POD#1, pt was started on clear liquid diet and they ambulated in the halls. On POD#2 the patient was transitioned to a regular diet, IVFs were discontinued, and the patient passed flatus. Prior to discharge the pt was tolerating a regular diet, pain was controlled on PO pain meds, they were ambulating without difficulty, and they had normal bowel function. His urine was clear prior to discharge and his drain was removed    Consults: None  Significant Diagnostic Studies: none  Treatments: surgery: Robot assisted laparoscopic radical prostatectomy  Discharge Exam: Blood pressure 130/75, pulse 66, temperature 98.2 F (36.8 C), temperature source Oral, resp. rate 18, height 6' (1.829 m), weight 91.2 kg (201 lb), SpO2 95 %. General appearance: alert, cooperative and appears stated age Head: Normocephalic, without obvious abnormality, atraumatic Nose: Nares normal. Septum midline. Mucosa normal. No drainage or sinus tenderness. Neck: no adenopathy, no carotid bruit, no JVD, supple, symmetrical, trachea midline and thyroid not enlarged, symmetric, no tenderness/mass/nodules Resp: clear to auscultation bilaterally Cardio: regular rate and rhythm, S1, S2 normal, no murmur, click, rub or gallop GI: soft, non-tender; bowel sounds normal; no masses,  no organomegaly Extremities: extremities normal, atraumatic, no cyanosis or edema Neurologic: Grossly normal  Disposition: 01-Home or Self Care  Discharge Instructions    Discharge patient   Complete by:  As directed    Discharge  disposition:  01-Home or Self Care   Discharge patient date:  10/07/2017     Allergies as of 10/07/2017   No Known Allergies     Medication List    TAKE these medications   HYDROcodone-acetaminophen 5-325 MG tablet Commonly known as:  NORCO Take 1-2 tablets every 6 (six) hours as needed by mouth for moderate pain or severe pain.   lisinopril-hydrochlorothiazide 20-12.5 MG tablet Commonly known as:  PRINZIDE,ZESTORETIC Take 1 tablet by mouth daily.   ranitidine 150 MG tablet Commonly known as:  ZANTAC Take 150 mg 2 (two) times daily by mouth.   sulfamethoxazole-trimethoprim 800-160 MG tablet Commonly known as:  BACTRIM DS,SEPTRA DS Take 1 tablet 2 (two) times daily by mouth. Start the day prior to foley removal appointment      Follow-up Information    Alexis Frock, MD Follow up on 10/15/2017.   Specialty:  Urology Why:  at 9:45 for MD visit and catheter removal. Dr. Tresa Moore will call you with pathology results when available.  Contact information: [address] [place] [address] [phone]           Signed: Nicolette Bang 10/15/2017, 11:25 AM

## 2017-11-02 DIAGNOSIS — N393 Stress incontinence (female) (male): Secondary | ICD-10-CM | POA: Diagnosis not present

## 2017-11-02 DIAGNOSIS — M6281 Muscle weakness (generalized): Secondary | ICD-10-CM | POA: Diagnosis not present

## 2017-11-02 DIAGNOSIS — M62838 Other muscle spasm: Secondary | ICD-10-CM | POA: Diagnosis not present

## 2017-11-26 DIAGNOSIS — M6281 Muscle weakness (generalized): Secondary | ICD-10-CM | POA: Diagnosis not present

## 2017-11-26 DIAGNOSIS — M62838 Other muscle spasm: Secondary | ICD-10-CM | POA: Diagnosis not present

## 2017-11-26 DIAGNOSIS — N393 Stress incontinence (female) (male): Secondary | ICD-10-CM | POA: Diagnosis not present

## 2017-11-30 DIAGNOSIS — C61 Malignant neoplasm of prostate: Secondary | ICD-10-CM | POA: Diagnosis not present

## 2018-01-03 ENCOUNTER — Other Ambulatory Visit: Payer: Self-pay | Admitting: Physician Assistant

## 2018-01-03 DIAGNOSIS — N183 Chronic kidney disease, stage 3 (moderate): Secondary | ICD-10-CM | POA: Diagnosis not present

## 2018-01-03 DIAGNOSIS — I129 Hypertensive chronic kidney disease with stage 1 through stage 4 chronic kidney disease, or unspecified chronic kidney disease: Secondary | ICD-10-CM | POA: Diagnosis not present

## 2018-01-03 DIAGNOSIS — E7800 Pure hypercholesterolemia, unspecified: Secondary | ICD-10-CM | POA: Diagnosis not present

## 2018-01-03 DIAGNOSIS — Z136 Encounter for screening for cardiovascular disorders: Secondary | ICD-10-CM

## 2018-01-03 DIAGNOSIS — Z0000 Encounter for general adult medical examination without abnormal findings: Secondary | ICD-10-CM | POA: Diagnosis not present

## 2018-01-03 DIAGNOSIS — Z1159 Encounter for screening for other viral diseases: Secondary | ICD-10-CM | POA: Diagnosis not present

## 2018-01-03 DIAGNOSIS — Z720 Tobacco use: Secondary | ICD-10-CM | POA: Diagnosis not present

## 2018-01-03 DIAGNOSIS — Z23 Encounter for immunization: Secondary | ICD-10-CM | POA: Diagnosis not present

## 2018-01-03 DIAGNOSIS — Z8546 Personal history of malignant neoplasm of prostate: Secondary | ICD-10-CM | POA: Diagnosis not present

## 2018-01-16 ENCOUNTER — Ambulatory Visit
Admission: RE | Admit: 2018-01-16 | Discharge: 2018-01-16 | Disposition: A | Discharge status: Home / Self Care | Payer: Medicare Other | Source: Ambulatory Visit | Attending: Physician Assistant | Admitting: Physician Assistant

## 2018-01-16 DIAGNOSIS — Z136 Encounter for screening for cardiovascular disorders: Secondary | ICD-10-CM

## 2018-02-26 ENCOUNTER — Encounter: Payer: Self-pay | Admitting: Internal Medicine

## 2018-02-26 ENCOUNTER — Ambulatory Visit (INDEPENDENT_AMBULATORY_CARE_PROVIDER_SITE_OTHER): Payer: Medicare Other | Admitting: Internal Medicine

## 2018-02-26 DIAGNOSIS — R768 Other specified abnormal immunological findings in serum: Secondary | ICD-10-CM | POA: Diagnosis not present

## 2018-02-26 DIAGNOSIS — R7689 Other specified abnormal immunological findings in serum: Secondary | ICD-10-CM | POA: Insufficient documentation

## 2018-02-26 NOTE — Progress Notes (Addendum)
Patient ID: Tony Lopez, male   DOB: [birth year], 67 y.o.   MRN: [id number]     [facility]      Reason for Consult:Hepatitis C antibody positive    Referring Physician: Heath Lark PA    Patient ID: Tony Lopez, male    DOB: November 22, 1951, 67 y.o.   MRN: [id number]  HPI:   He comes in due to recent positive Ab test for hepatitis C.  Never tested before.  Has a history of prostate cancer.  No transaminitis.  No confirmatory test yet.  He does recall an episode of icterus in the 1970s along with his sisters at the time.  No other known liver issues.  No history of IVDU, blood transfusions.  No tattoos.  No associated fatigue, rashes.   Previous record reviewed Epic and no transaminitis on recent CMP.    Past Medical History:  Diagnosis Date  . Arthritis   . GERD (gastroesophageal reflux disease)   . Hepatitis C   . Hypertension   . Prostate cancer [facility])     Prior to Admission medications   Medication Sig Start Date End Date Taking? Authorizing Provider  lisinopril-hydrochlorothiazide (PRINZIDE,ZESTORETIC) 20-12.5 MG tablet Take 1 tablet by mouth daily.   Yes [provider]  ranitidine (ZANTAC) 150 MG tablet Take 150 mg 2 (two) times daily by mouth.   Yes [provider]    No Known Allergies  Social History   Tobacco Use  . Smoking status: Current Every Day Smoker    Packs/day: 1.00    Years: 35.00    Pack years: 35.00    Types: Cigarettes  . Smokeless tobacco: Never Used  Substance Use Topics  . Alcohol use: Yes    Comment: SELDOM   . Drug use: No    Family History  Problem Relation Age of Onset  . Lung cancer Mother   . Prostate cancer Neg Hx   two sisters with unknown hepatitis  Review of Systems  Constitutional: negative for fatigue and malaise Gastrointestinal: negative for diarrhea Musculoskeletal: negative for arthralgias All other systems reviewed and are negative    Constitutional: in no apparent  distress  Vitals:   02/26/18 0850  BP: 117/69  Pulse: 78  Resp: 18  Temp: 97.9 F (36.6 C)  SpO2: 98%   EYES: anicteric ENMT: no thrush Cardiovascular: Cor RRR Respiratory: CTA B; normal respiratory effort GI: Bowel sounds are normal, liver is not enlarged, spleen is not enlarged Musculoskeletal: no pedal edema noted Skin: negatives: no rash Hematologic: no cervical lad  Labs: Lab Results  Component Value Date   WBC 14.9 (H) 10/01/2017   HGB 13.6 10/06/2017   HCT 39.0 10/06/2017   MCV 92.2 10/01/2017   PLT 300 10/01/2017    Lab Results  Component Value Date   CREATININE 1.02 10/06/2017   BUN 12 10/06/2017   NA 136 10/06/2017   K 3.8 10/06/2017   CL 104 10/06/2017   CO2 24 10/06/2017    Lab Results  Component Value Date   ALT 22 10/01/2017   AST 18 10/01/2017   ALKPHOS 69 10/01/2017   BILITOT 0.7 10/01/2017     Assessment: Hepatitis C Ab positive.  I discussed with him that 20% of people exposed to hepatitis C clear the virus spontaneously in the first year after exposure.  I also discussed the nature of chronic hepatitis C including risk for cirrhosis, liver cancer and treatment options available if he is positive.  Plan: 1) HCV RNA and genotype for confirmation 2) if negative, no follow up needed and will call patient 3) if positive RNA, will call patient for follow up labs and ultrasound with elastography and return with me for further discussion of treatment options after those results.   Has had PCV13.   ADDENDUM 03/01/18: his hepatitis C RNA is negative confirming that he does not have active hepatitis C due to spontaneous clearance at some point in the first year after exposure.  No follow up indicated.  Patient will be alerted.

## 2018-02-28 LAB — HEPATITIS C RNA QUANTITATIVE
HCV Quantitative Log: <1.18 Log IU/mL
HCV RNA, PCR, QN: NOT DETECTED [IU]/mL

## 2018-02-28 LAB — HEPATITIS C GENOTYPE: HCV Genotype: NOT DETECTED

## 2018-03-01 ENCOUNTER — Telehealth: Payer: Self-pay

## 2018-03-01 NOTE — Telephone Encounter (Signed)
Per Dr. Linus Salmons attempted to call pt with lab results and to cancel his appt since he does not need to be seen. Was unable to reach the pt left a vm for him to call back. [place]

## 2018-03-01 NOTE — Telephone Encounter (Signed)
-----   Message from Thayer Headings, MD sent at 03/01/2018  1:50 PM EDT ----- Could you let him know that his hepatitis C RNA is negative so he does not have it anymore and it does not come back, unless he gets reexposed for some reason.  No treatment indicated. You can cancel his follow up with me.  I will alert his PCP.   thanks

## 2018-03-04 ENCOUNTER — Other Ambulatory Visit: Payer: Self-pay | Admitting: Pharmacist Clinician (PhC)/ Clinical Pharmacy Specialist

## 2018-03-04 DIAGNOSIS — R768 Other specified abnormal immunological findings in serum: Secondary | ICD-10-CM

## 2018-07-14 IMAGING — US US ABDOMINAL AORTA SCREENING AAA
1 series · 14 of 15 positions shown · non-contrast
Comparison: None.

CLINICAL DATA: 66 year old male with smoking history. First on
exam. Hypertension.

EXAM:
ULTRASOUND OF ABDOMINAL AORTA
TECHNIQUE: Ultrasound examination of the abdominal aorta was performed to
evaluate for abdominal aortic aneurysm.

[Series 1: us abdominal aorta screening aaa · 0.30mm/px · 14 of 15 slices shown]
[im 1/15]
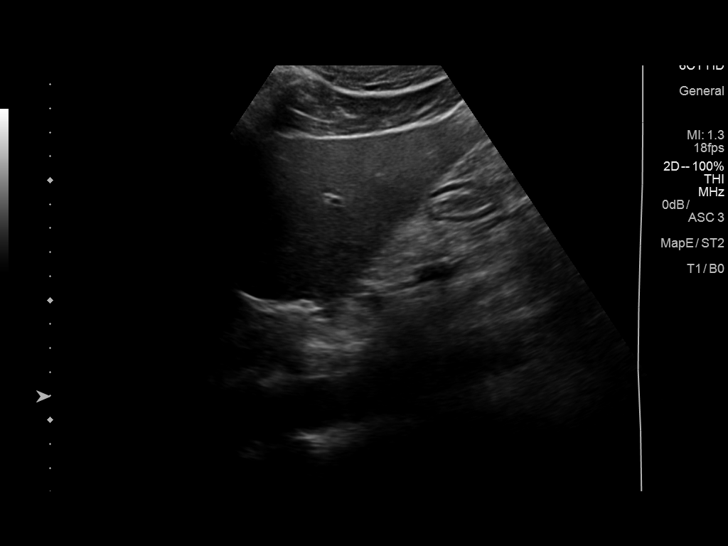
[im 2/15]
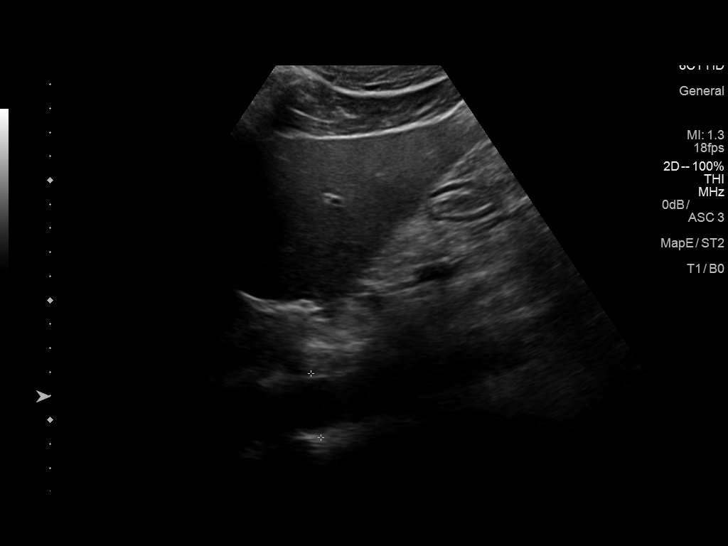
[im 3/15]
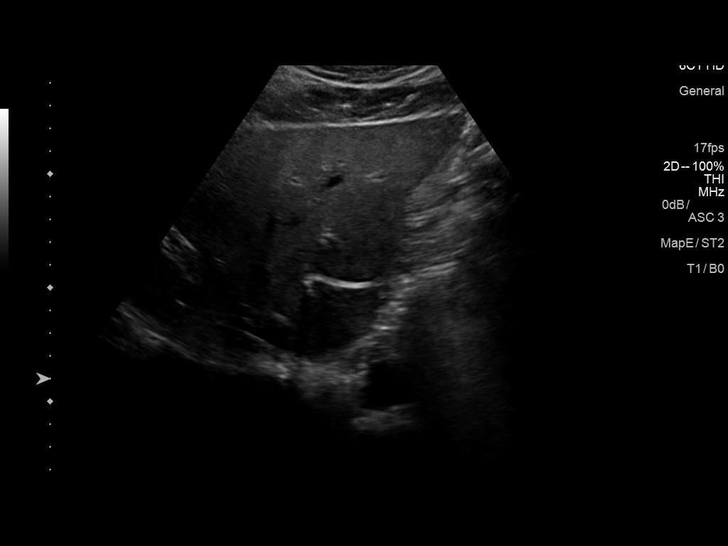
[im 4/15]
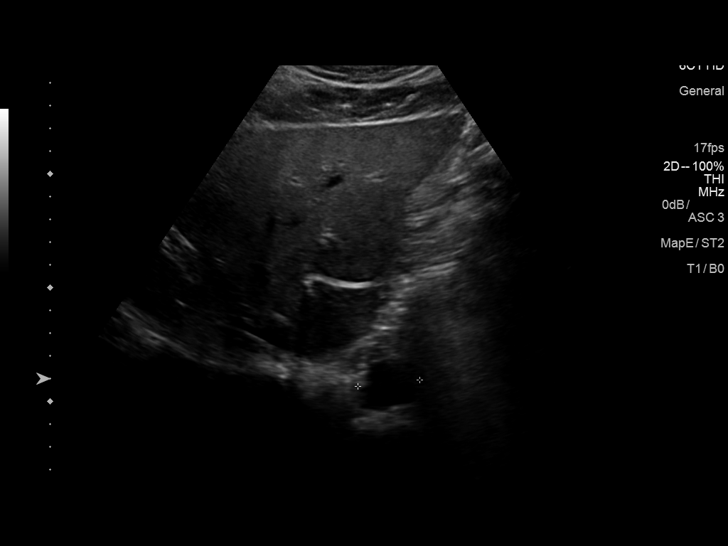
[im 5/15]
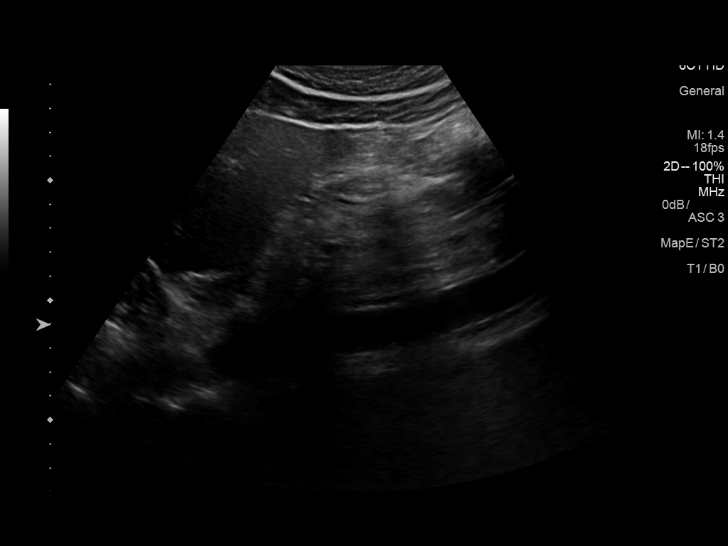
[im 6/15]
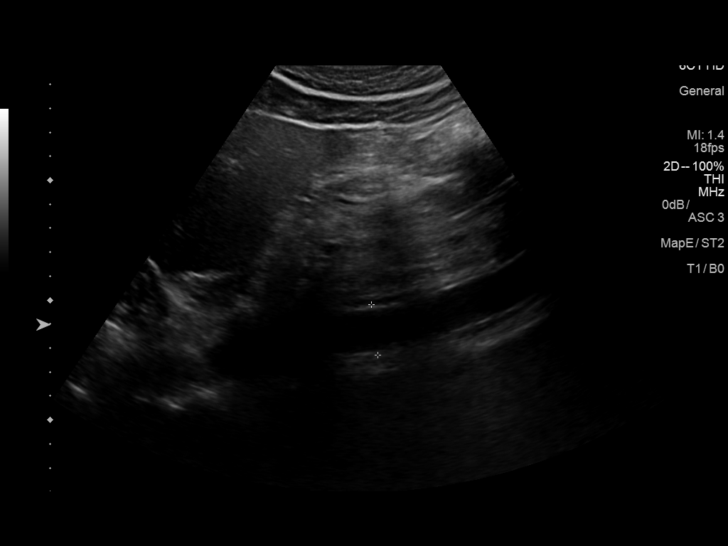
[im 7/15]
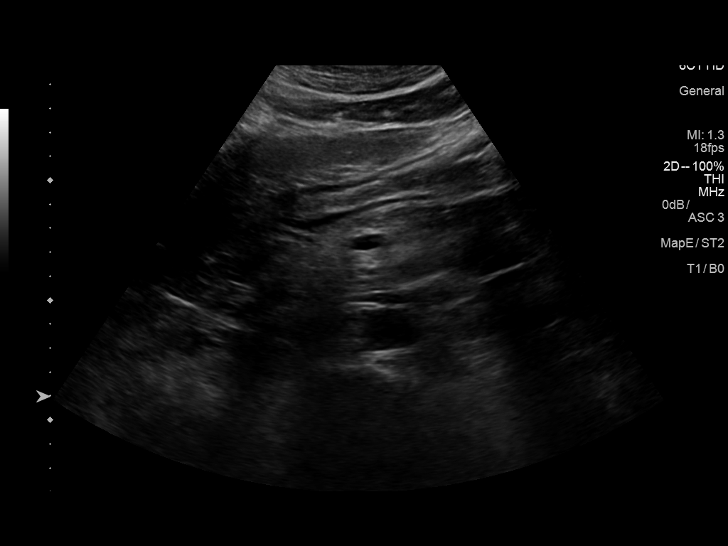
[im 9/15]
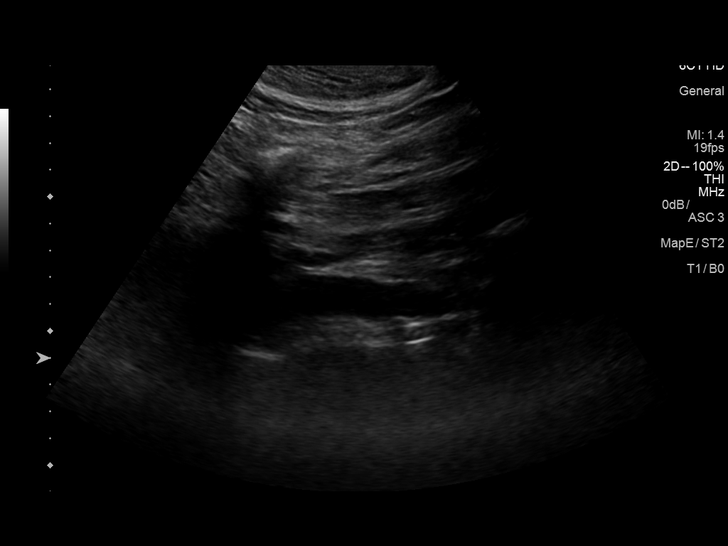
[im 10/15]
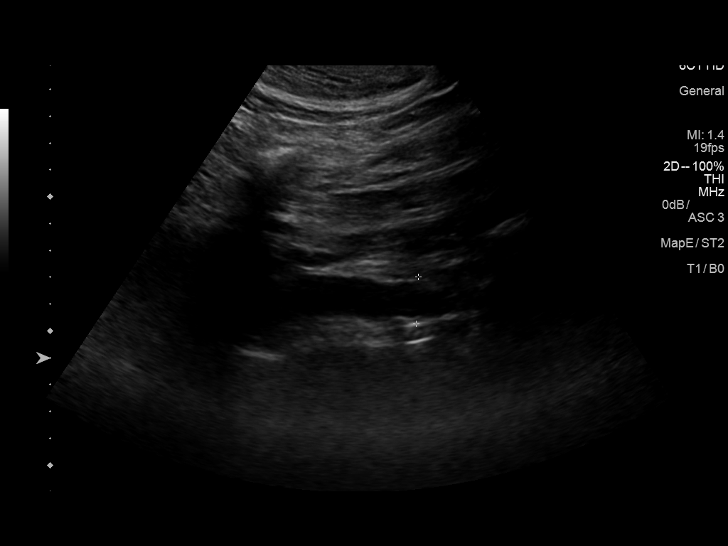
[im 11/15]
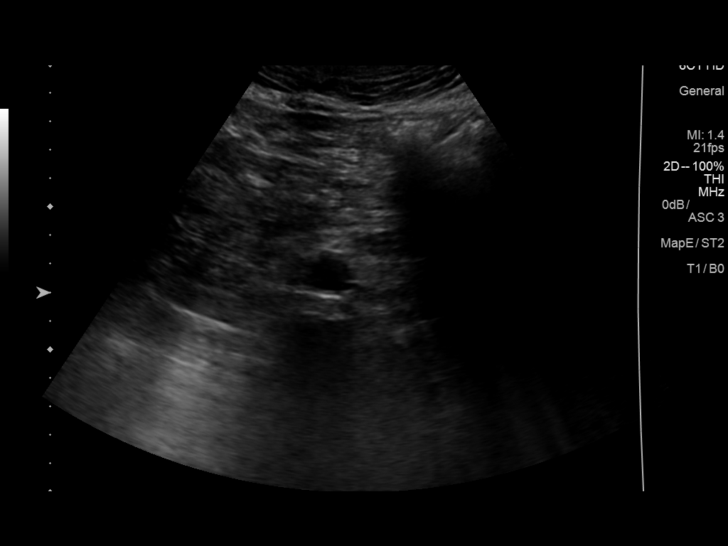
[im 12/15]
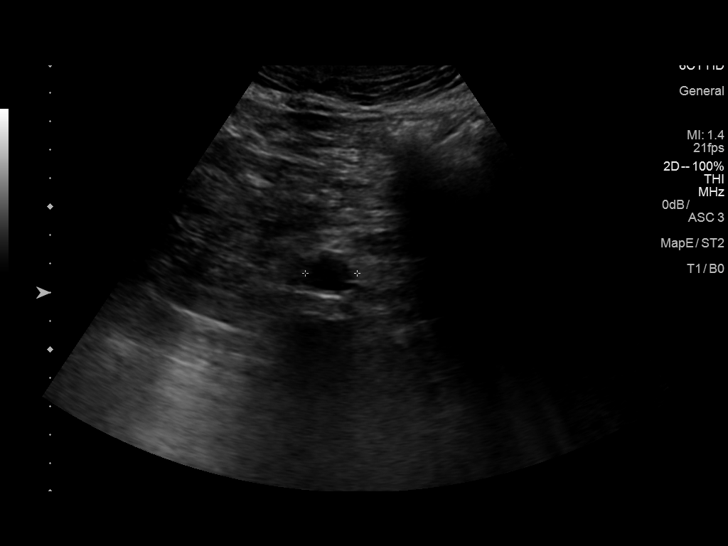
[im 13/15]
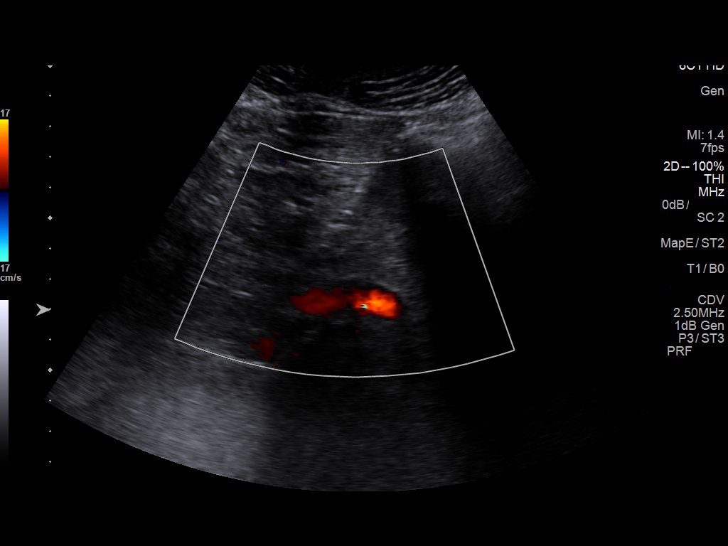
[im 14/15]
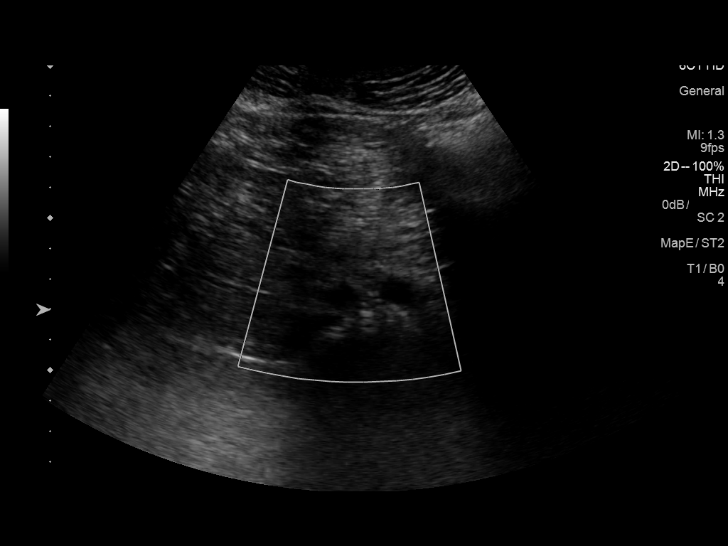
[im 15/15]
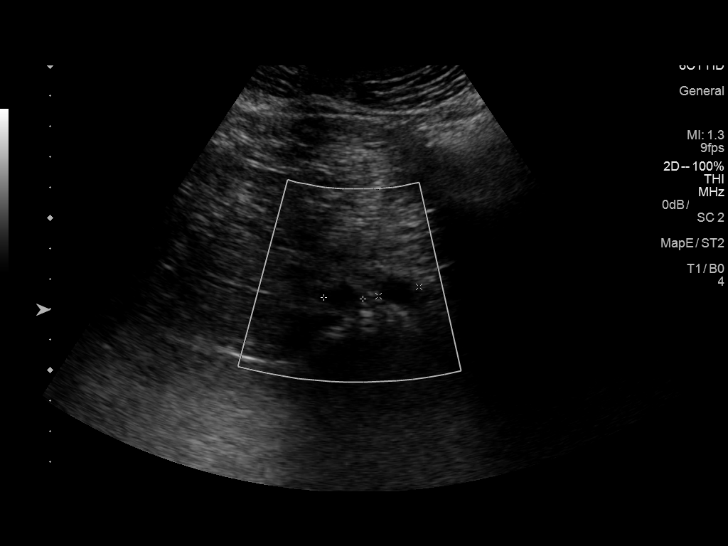

[14 of 15 positions shown; findings below may reference images not displayed]

FINDINGS: Abdominal aortic measurements as follows:

Proximal:  2.7 cm

Mid:  2.3 cm

Distal:  1.8 cm
IMPRESSION: No evidence of abdominal aortic aneurysm.

## 2018-07-25 DIAGNOSIS — I129 Hypertensive chronic kidney disease with stage 1 through stage 4 chronic kidney disease, or unspecified chronic kidney disease: Secondary | ICD-10-CM | POA: Diagnosis not present

## 2018-07-25 DIAGNOSIS — Z23 Encounter for immunization: Secondary | ICD-10-CM | POA: Diagnosis not present

## 2018-07-25 DIAGNOSIS — N183 Chronic kidney disease, stage 3 (moderate): Secondary | ICD-10-CM | POA: Diagnosis not present

## 2018-07-25 DIAGNOSIS — E7800 Pure hypercholesterolemia, unspecified: Secondary | ICD-10-CM | POA: Diagnosis not present

## 2018-08-05 DIAGNOSIS — C61 Malignant neoplasm of prostate: Secondary | ICD-10-CM | POA: Diagnosis not present

## 2018-08-05 DIAGNOSIS — N393 Stress incontinence (female) (male): Secondary | ICD-10-CM | POA: Diagnosis not present

## 2018-08-05 DIAGNOSIS — N5231 Erectile dysfunction following radical prostatectomy: Secondary | ICD-10-CM | POA: Diagnosis not present

## 2018-08-05 DIAGNOSIS — N183 Chronic kidney disease, stage 3 (moderate): Secondary | ICD-10-CM | POA: Diagnosis not present

## 2018-08-19 DIAGNOSIS — M79675 Pain in left toe(s): Secondary | ICD-10-CM | POA: Diagnosis not present

## 2018-09-23 DIAGNOSIS — M79675 Pain in left toe(s): Secondary | ICD-10-CM | POA: Diagnosis not present

## 2019-01-09 DIAGNOSIS — I129 Hypertensive chronic kidney disease with stage 1 through stage 4 chronic kidney disease, or unspecified chronic kidney disease: Secondary | ICD-10-CM | POA: Diagnosis not present

## 2019-01-09 DIAGNOSIS — N183 Chronic kidney disease, stage 3 (moderate): Secondary | ICD-10-CM | POA: Diagnosis not present

## 2019-01-09 DIAGNOSIS — Z1211 Encounter for screening for malignant neoplasm of colon: Secondary | ICD-10-CM | POA: Diagnosis not present

## 2019-01-09 DIAGNOSIS — Z0000 Encounter for general adult medical examination without abnormal findings: Secondary | ICD-10-CM | POA: Diagnosis not present

## 2019-01-09 DIAGNOSIS — E7800 Pure hypercholesterolemia, unspecified: Secondary | ICD-10-CM | POA: Diagnosis not present

## 2019-01-09 DIAGNOSIS — Z720 Tobacco use: Secondary | ICD-10-CM | POA: Diagnosis not present

## 2019-01-09 DIAGNOSIS — Z8546 Personal history of malignant neoplasm of prostate: Secondary | ICD-10-CM | POA: Diagnosis not present

## 2019-01-09 DIAGNOSIS — Z23 Encounter for immunization: Secondary | ICD-10-CM | POA: Diagnosis not present

## 2019-07-10 DIAGNOSIS — Z720 Tobacco use: Secondary | ICD-10-CM | POA: Diagnosis not present

## 2019-07-10 DIAGNOSIS — I129 Hypertensive chronic kidney disease with stage 1 through stage 4 chronic kidney disease, or unspecified chronic kidney disease: Secondary | ICD-10-CM | POA: Diagnosis not present

## 2019-07-10 DIAGNOSIS — N183 Chronic kidney disease, stage 3 (moderate): Secondary | ICD-10-CM | POA: Diagnosis not present

## 2019-07-10 DIAGNOSIS — E7800 Pure hypercholesterolemia, unspecified: Secondary | ICD-10-CM | POA: Diagnosis not present

## 2019-07-11 DIAGNOSIS — N183 Chronic kidney disease, stage 3 (moderate): Secondary | ICD-10-CM | POA: Diagnosis not present

## 2019-07-11 DIAGNOSIS — I129 Hypertensive chronic kidney disease with stage 1 through stage 4 chronic kidney disease, or unspecified chronic kidney disease: Secondary | ICD-10-CM | POA: Diagnosis not present

## 2019-07-11 DIAGNOSIS — E7800 Pure hypercholesterolemia, unspecified: Secondary | ICD-10-CM | POA: Diagnosis not present

## 2020-01-13 DIAGNOSIS — I129 Hypertensive chronic kidney disease with stage 1 through stage 4 chronic kidney disease, or unspecified chronic kidney disease: Secondary | ICD-10-CM | POA: Diagnosis not present

## 2020-01-13 DIAGNOSIS — Z720 Tobacco use: Secondary | ICD-10-CM | POA: Diagnosis not present

## 2020-01-13 DIAGNOSIS — Z23 Encounter for immunization: Secondary | ICD-10-CM | POA: Diagnosis not present

## 2020-01-13 DIAGNOSIS — E7800 Pure hypercholesterolemia, unspecified: Secondary | ICD-10-CM | POA: Diagnosis not present

## 2020-01-13 DIAGNOSIS — Z1211 Encounter for screening for malignant neoplasm of colon: Secondary | ICD-10-CM | POA: Diagnosis not present

## 2020-01-13 DIAGNOSIS — Z0000 Encounter for general adult medical examination without abnormal findings: Secondary | ICD-10-CM | POA: Diagnosis not present

## 2020-01-13 DIAGNOSIS — Z8546 Personal history of malignant neoplasm of prostate: Secondary | ICD-10-CM | POA: Diagnosis not present

## 2020-01-13 DIAGNOSIS — N183 Chronic kidney disease, stage 3 unspecified: Secondary | ICD-10-CM | POA: Diagnosis not present

## 2020-02-03 DIAGNOSIS — Z23 Encounter for immunization: Secondary | ICD-10-CM | POA: Diagnosis not present

## 2020-03-02 DIAGNOSIS — Z23 Encounter for immunization: Secondary | ICD-10-CM | POA: Diagnosis not present

## 2020-07-12 DIAGNOSIS — I129 Hypertensive chronic kidney disease with stage 1 through stage 4 chronic kidney disease, or unspecified chronic kidney disease: Secondary | ICD-10-CM | POA: Diagnosis not present

## 2020-07-12 DIAGNOSIS — E7800 Pure hypercholesterolemia, unspecified: Secondary | ICD-10-CM | POA: Diagnosis not present

## 2020-07-12 DIAGNOSIS — F17200 Nicotine dependence, unspecified, uncomplicated: Secondary | ICD-10-CM | POA: Diagnosis not present

## 2020-07-12 DIAGNOSIS — N183 Chronic kidney disease, stage 3 unspecified: Secondary | ICD-10-CM | POA: Diagnosis not present

## 2020-08-21 ENCOUNTER — Other Ambulatory Visit: Payer: Self-pay

## 2020-08-21 ENCOUNTER — Ambulatory Visit: Payer: Self-pay

## 2020-08-21 ENCOUNTER — Ambulatory Visit
Admission: RE | Admit: 2020-08-21 | Discharge: 2020-08-21 | Disposition: A | Discharge status: Home / Self Care | Payer: Medicare Other | Source: Ambulatory Visit | Attending: Emergency Medicine | Admitting: Emergency Medicine

## 2020-08-21 VITALS — BP 174/99 | HR 80 | Temp 98.5°F | Resp 16

## 2020-08-21 DIAGNOSIS — M109 Gout, unspecified: Secondary | ICD-10-CM

## 2020-08-21 MED ORDER — PREDNISONE 10 MG (21) PO TBPK: ORAL_TABLET | ORAL | 0 refills | Status: DC

## 2020-08-21 MED ORDER — ACETAMINOPHEN 500 MG PO TABS: 500.0000 mg | ORAL_TABLET | Freq: Four times a day (QID) | ORAL | 0 refills | Status: DC | PRN

## 2020-08-21 NOTE — Discharge Instructions (Addendum)
Prescribed prednisone take as directed  Prescribed Tylenol 500 mg/take as needed for pain Follow up with PCP for further evaluation and management Return or go to the ED if you have any new or worsening symptoms

## 2020-08-21 NOTE — ED Provider Notes (Signed)
[name]   [id number] 08/21/20 Arrival Time: 1138   Chief Complaint  Patient presents with  . Foot Pain     SUBJECTIVE: History from: patient.  Tony Lopez is a 69 y.o. male who presented to the urgent care for complaint of right great toe pain for the past 5 days.  Stated symptoms started after he twisted his foot couple days prior.  He localizes the pain to the right right foot and precisely right great toe he describes the pain as constant and achy.  He has tried OTC medications without relief.  His symptoms are made worse with ROM.  He denies similar symptoms in the past.    ROS: As per HPI.  All other pertinent ROS negative.      Past Medical History:  Diagnosis Date  . Arthritis   . GERD (gastroesophageal reflux disease)   . Hepatitis C   . Hypertension   . Prostate cancer [facility])    Past Surgical History:  Procedure Laterality Date  . BACK SURGERY  1999  . CYSTOSCOPY WITH INJECTION N/A 10/05/2017   Procedure: CYSTOSCOPY WITH INJECTION INDOCYANINE GREEN DYE;  Surgeon: Alexis Frock, MD;  Location: [facility];  Service: Urology;  Laterality: N/A;  . KNEE SURGERY Left early 90s   CARTILAGE CLEAN OUT   . LYMPHADENECTOMY Bilateral 10/05/2017   Procedure: PELVIC LYMPHADENECTOMY;  Surgeon: Alexis Frock, MD;  Location: [facility];  Service: Urology;  Laterality: Bilateral;  . ROBOT ASSISTED LAPAROSCOPIC RADICAL PROSTATECTOMY N/A 10/05/2017   Procedure: XI ROBOTIC ASSISTED LAPAROSCOPIC RADICAL PROSTATECTOMY;  Surgeon: Alexis Frock, MD;  Location: [facility];  Service: Urology;  Laterality: N/A;   No Known Allergies No current facility-administered medications on file prior to encounter.   Current Outpatient Medications on File Prior to Encounter  Medication Sig Dispense Refill  . lisinopril-hydrochlorothiazide (PRINZIDE,ZESTORETIC) 20-12.5 MG tablet Take 1 tablet by mouth daily.    . ranitidine (ZANTAC) 150 MG tablet Take 150 mg 2 (two) times daily by mouth.       Social History   Socioeconomic History  . Marital status: Legally Separated    Spouse name: Not on file  . Number of children: Not on file  . Years of education: Not on file  . Highest education level: Not on file  Occupational History  . Not on file  Tobacco Use  . Smoking status: Current Every Day Smoker    Packs/day: 1.00    Years: 35.00    Pack years: 35.00    Types: Cigarettes  . Smokeless tobacco: Never Used  Substance and Sexual Activity  . Alcohol use: Yes    Comment: SELDOM   . Drug use: No  . Sexual activity: Not on file  Other Topics Concern  . Not on file  Social History Narrative  . Not on file   Social Determinants of Health   Financial Resource Strain:   . Difficulty of Paying Living Expenses: Not on file  Food Insecurity:   . Worried About Charity fundraiser in the Last Year: Not on file  . Ran Out of Food in the Last Year: Not on file  Transportation Needs:   . Lack of Transportation (Medical): Not on file  . Lack of Transportation (Non-Medical): Not on file  Physical Activity:   . Days of Exercise per Week: Not on file  . Minutes of Exercise per Session: Not on file  Stress:   . Feeling of Stress : Not on file  Social Connections:   .  Frequency of Communication with Friends and Family: Not on file  . Frequency of Social Gatherings with Friends and Family: Not on file  . Attends Religious Services: Not on file  . Active Member of Clubs or Organizations: Not on file  . Attends Archivist Meetings: Not on file  . Marital Status: Not on file  Intimate Partner Violence:   . Fear of Current or Ex-Partner: Not on file  . Emotionally Abused: Not on file  . Physically Abused: Not on file  . Sexually Abused: Not on file   Family History  Problem Relation Age of Onset  . Lung cancer Mother   . Prostate cancer Neg Hx     OBJECTIVE:  Vitals:   08/21/20 1204  BP: (!) 174/99  Pulse: 80  Resp: 16  Temp: 98.5 F (36.9 C)  SpO2: 98%      Physical Exam Vitals and nursing note reviewed.  Constitutional:      General: He is not in acute distress.    Appearance: Normal appearance. He is normal weight. He is not ill-appearing, toxic-appearing or diaphoretic.  Cardiovascular:     Rate and Rhythm: Normal rate and regular rhythm.     Pulses: Normal pulses.     Heart sounds: Normal heart sounds. No murmur heard.  No friction rub. No gallop.   Pulmonary:     Effort: Pulmonary effort is normal. No respiratory distress.     Breath sounds: Normal breath sounds. No stridor. No wheezing, rhonchi or rales.  Chest:     Chest wall: No tenderness.  Musculoskeletal:        General: Tenderness present.     Right foot: Swelling and tenderness present.     Left foot: Normal.     Comments: Patient is able to ambulate and bear weight with pain.  The right foot is with obvious deformity when compared to the left foot.  Warmth, redness and swelling present.  No lesion, no surface trauma, open wound, present.  Limited range of motion of the great toe.  Neurovascular status intact.  Neurological:     Mental Status: He is alert and oriented to person, place, and time.      LABS:  No results found for this or any previous visit (from the past 24 hour(s)).   ASSESSMENT & PLAN:  1. Acute gout involving toe of right foot, unspecified cause     Meds ordered this encounter  Medications  . predniSONE (STERAPRED UNI-PAK 21 TAB) 10 MG (21) TBPK tablet    Sig: Take 6 tabs by mouth daily  for 1 days, then 5 tabs for 1 days, then 4 tabs for 1 days, then 3 tabs for 1 days, 2 tabs for 1 days, then 1 tab by mouth daily for 1 days    Dispense:  21 tablet    Refill:  0  . acetaminophen (TYLENOL) 500 MG tablet    Sig: Take 1 tablet (500 mg total) by mouth every 6 (six) hours as needed.    Dispense:  30 tablet    Refill:  0    Discharge instructions  Prescribed prednisone take as directed  Prescribed Tylenol 500 mg/take as needed for  pain Follow up with PCP for further evaluation and management Return or go to the ED if you have any new or worsening symptoms  Reviewed expectations re: course of current medical issues. Questions answered. Outlined signs and symptoms indicating need for more acute intervention. Patient verbalized understanding. After Visit  Summary given.         Emerson Monte, [place] 08/21/20 1316

## 2020-08-21 NOTE — ED Triage Notes (Signed)
Patient states that he twisted his right foot having some pain walking, some mucle pain. Patient is ambulatory and walking on foot.

## 2020-09-14 DIAGNOSIS — C61 Malignant neoplasm of prostate: Secondary | ICD-10-CM | POA: Diagnosis not present

## 2020-09-14 DIAGNOSIS — N393 Stress incontinence (female) (male): Secondary | ICD-10-CM | POA: Diagnosis not present

## 2020-09-14 DIAGNOSIS — N5231 Erectile dysfunction following radical prostatectomy: Secondary | ICD-10-CM | POA: Diagnosis not present

## 2020-10-06 DIAGNOSIS — C61 Malignant neoplasm of prostate: Secondary | ICD-10-CM | POA: Diagnosis not present

## 2020-10-06 DIAGNOSIS — N5231 Erectile dysfunction following radical prostatectomy: Secondary | ICD-10-CM | POA: Diagnosis not present

## 2020-10-19 DIAGNOSIS — Z23 Encounter for immunization: Secondary | ICD-10-CM | POA: Diagnosis not present

## 2020-12-09 DIAGNOSIS — M79671 Pain in right foot: Secondary | ICD-10-CM | POA: Diagnosis not present

## 2020-12-09 DIAGNOSIS — Z23 Encounter for immunization: Secondary | ICD-10-CM | POA: Diagnosis not present

## 2021-01-18 DIAGNOSIS — M2550 Pain in unspecified joint: Secondary | ICD-10-CM | POA: Diagnosis not present

## 2021-01-18 DIAGNOSIS — E7800 Pure hypercholesterolemia, unspecified: Secondary | ICD-10-CM | POA: Diagnosis not present

## 2021-01-18 DIAGNOSIS — Z23 Encounter for immunization: Secondary | ICD-10-CM | POA: Diagnosis not present

## 2021-01-18 DIAGNOSIS — Z0000 Encounter for general adult medical examination without abnormal findings: Secondary | ICD-10-CM | POA: Diagnosis not present

## 2021-01-18 DIAGNOSIS — Z8546 Personal history of malignant neoplasm of prostate: Secondary | ICD-10-CM | POA: Diagnosis not present

## 2021-01-18 DIAGNOSIS — Z1211 Encounter for screening for malignant neoplasm of colon: Secondary | ICD-10-CM | POA: Diagnosis not present

## 2021-01-18 DIAGNOSIS — I129 Hypertensive chronic kidney disease with stage 1 through stage 4 chronic kidney disease, or unspecified chronic kidney disease: Secondary | ICD-10-CM | POA: Diagnosis not present

## 2021-01-18 DIAGNOSIS — F17200 Nicotine dependence, unspecified, uncomplicated: Secondary | ICD-10-CM | POA: Diagnosis not present

## 2021-03-18 DIAGNOSIS — R739 Hyperglycemia, unspecified: Secondary | ICD-10-CM | POA: Diagnosis not present

## 2021-03-18 DIAGNOSIS — M109 Gout, unspecified: Secondary | ICD-10-CM | POA: Diagnosis not present

## 2021-03-18 DIAGNOSIS — Z23 Encounter for immunization: Secondary | ICD-10-CM | POA: Diagnosis not present

## 2021-10-28 DIAGNOSIS — C61 Malignant neoplasm of prostate: Secondary | ICD-10-CM | POA: Diagnosis not present

## 2021-10-30 DIAGNOSIS — Z23 Encounter for immunization: Secondary | ICD-10-CM | POA: Diagnosis not present

## 2021-11-04 DIAGNOSIS — C61 Malignant neoplasm of prostate: Secondary | ICD-10-CM | POA: Diagnosis not present

## 2021-11-04 DIAGNOSIS — N5231 Erectile dysfunction following radical prostatectomy: Secondary | ICD-10-CM | POA: Diagnosis not present

## 2021-11-04 DIAGNOSIS — N393 Stress incontinence (female) (male): Secondary | ICD-10-CM | POA: Diagnosis not present

## 2022-02-03 DIAGNOSIS — N1831 Chronic kidney disease, stage 3a: Secondary | ICD-10-CM | POA: Diagnosis not present

## 2022-02-03 DIAGNOSIS — M5137 Other intervertebral disc degeneration, lumbosacral region: Secondary | ICD-10-CM | POA: Diagnosis not present

## 2022-02-03 DIAGNOSIS — I69328 Other speech and language deficits following cerebral infarction: Secondary | ICD-10-CM | POA: Diagnosis not present

## 2022-02-03 DIAGNOSIS — F17210 Nicotine dependence, cigarettes, uncomplicated: Secondary | ICD-10-CM | POA: Diagnosis not present

## 2022-02-03 DIAGNOSIS — I10 Essential (primary) hypertension: Secondary | ICD-10-CM | POA: Diagnosis not present

## 2022-02-03 DIAGNOSIS — M109 Gout, unspecified: Secondary | ICD-10-CM | POA: Diagnosis not present

## 2022-02-03 DIAGNOSIS — E7800 Pure hypercholesterolemia, unspecified: Secondary | ICD-10-CM | POA: Diagnosis not present

## 2022-02-03 DIAGNOSIS — I129 Hypertensive chronic kidney disease with stage 1 through stage 4 chronic kidney disease, or unspecified chronic kidney disease: Secondary | ICD-10-CM | POA: Diagnosis not present

## 2022-02-03 DIAGNOSIS — J449 Chronic obstructive pulmonary disease, unspecified: Secondary | ICD-10-CM | POA: Diagnosis not present

## 2022-02-03 DIAGNOSIS — Z9181 History of falling: Secondary | ICD-10-CM | POA: Diagnosis not present

## 2022-02-03 DIAGNOSIS — I69354 Hemiplegia and hemiparesis following cerebral infarction affecting left non-dominant side: Secondary | ICD-10-CM | POA: Diagnosis not present

## 2022-02-03 DIAGNOSIS — Z602 Problems related to living alone: Secondary | ICD-10-CM | POA: Diagnosis not present

## 2022-02-03 DIAGNOSIS — I69351 Hemiplegia and hemiparesis following cerebral infarction affecting right dominant side: Secondary | ICD-10-CM | POA: Diagnosis not present

## 2022-02-07 DIAGNOSIS — M5137 Other intervertebral disc degeneration, lumbosacral region: Secondary | ICD-10-CM | POA: Diagnosis not present

## 2022-02-07 DIAGNOSIS — I10 Essential (primary) hypertension: Secondary | ICD-10-CM | POA: Diagnosis not present

## 2022-02-07 DIAGNOSIS — I69328 Other speech and language deficits following cerebral infarction: Secondary | ICD-10-CM | POA: Diagnosis not present

## 2022-02-07 DIAGNOSIS — Z9181 History of falling: Secondary | ICD-10-CM | POA: Diagnosis not present

## 2022-02-07 DIAGNOSIS — Z602 Problems related to living alone: Secondary | ICD-10-CM | POA: Diagnosis not present

## 2022-02-07 DIAGNOSIS — J449 Chronic obstructive pulmonary disease, unspecified: Secondary | ICD-10-CM | POA: Diagnosis not present

## 2022-02-07 DIAGNOSIS — I69351 Hemiplegia and hemiparesis following cerebral infarction affecting right dominant side: Secondary | ICD-10-CM | POA: Diagnosis not present

## 2022-02-07 DIAGNOSIS — I69354 Hemiplegia and hemiparesis following cerebral infarction affecting left non-dominant side: Secondary | ICD-10-CM | POA: Diagnosis not present

## 2022-02-07 DIAGNOSIS — F17210 Nicotine dependence, cigarettes, uncomplicated: Secondary | ICD-10-CM | POA: Diagnosis not present

## 2022-02-13 DIAGNOSIS — Z602 Problems related to living alone: Secondary | ICD-10-CM | POA: Diagnosis not present

## 2022-02-13 DIAGNOSIS — F17210 Nicotine dependence, cigarettes, uncomplicated: Secondary | ICD-10-CM | POA: Diagnosis not present

## 2022-02-13 DIAGNOSIS — I69328 Other speech and language deficits following cerebral infarction: Secondary | ICD-10-CM | POA: Diagnosis not present

## 2022-02-13 DIAGNOSIS — Z9181 History of falling: Secondary | ICD-10-CM | POA: Diagnosis not present

## 2022-02-13 DIAGNOSIS — I69351 Hemiplegia and hemiparesis following cerebral infarction affecting right dominant side: Secondary | ICD-10-CM | POA: Diagnosis not present

## 2022-02-13 DIAGNOSIS — J449 Chronic obstructive pulmonary disease, unspecified: Secondary | ICD-10-CM | POA: Diagnosis not present

## 2022-02-13 DIAGNOSIS — I69354 Hemiplegia and hemiparesis following cerebral infarction affecting left non-dominant side: Secondary | ICD-10-CM | POA: Diagnosis not present

## 2022-02-13 DIAGNOSIS — M5137 Other intervertebral disc degeneration, lumbosacral region: Secondary | ICD-10-CM | POA: Diagnosis not present

## 2022-02-13 DIAGNOSIS — I10 Essential (primary) hypertension: Secondary | ICD-10-CM | POA: Diagnosis not present

## 2022-03-16 DIAGNOSIS — R739 Hyperglycemia, unspecified: Secondary | ICD-10-CM | POA: Diagnosis not present

## 2022-03-16 DIAGNOSIS — Z1211 Encounter for screening for malignant neoplasm of colon: Secondary | ICD-10-CM | POA: Diagnosis not present

## 2022-03-27 DIAGNOSIS — K219 Gastro-esophageal reflux disease without esophagitis: Secondary | ICD-10-CM | POA: Diagnosis not present

## 2022-03-27 DIAGNOSIS — R195 Other fecal abnormalities: Secondary | ICD-10-CM | POA: Diagnosis not present

## 2022-05-12 DIAGNOSIS — K648 Other hemorrhoids: Secondary | ICD-10-CM | POA: Diagnosis not present

## 2022-05-12 DIAGNOSIS — R195 Other fecal abnormalities: Secondary | ICD-10-CM | POA: Diagnosis not present

## 2022-05-12 DIAGNOSIS — K319 Disease of stomach and duodenum, unspecified: Secondary | ICD-10-CM | POA: Diagnosis not present

## 2022-05-12 DIAGNOSIS — D124 Benign neoplasm of descending colon: Secondary | ICD-10-CM | POA: Diagnosis not present

## 2022-05-12 DIAGNOSIS — R12 Heartburn: Secondary | ICD-10-CM | POA: Diagnosis not present

## 2022-05-12 DIAGNOSIS — D123 Benign neoplasm of transverse colon: Secondary | ICD-10-CM | POA: Diagnosis not present

## 2022-05-12 DIAGNOSIS — D125 Benign neoplasm of sigmoid colon: Secondary | ICD-10-CM | POA: Diagnosis not present

## 2022-05-12 DIAGNOSIS — D122 Benign neoplasm of ascending colon: Secondary | ICD-10-CM | POA: Diagnosis not present

## 2022-05-12 DIAGNOSIS — K2289 Other specified disease of esophagus: Secondary | ICD-10-CM | POA: Diagnosis not present

## 2022-05-12 DIAGNOSIS — K5730 Diverticulosis of large intestine without perforation or abscess without bleeding: Secondary | ICD-10-CM | POA: Diagnosis not present

## 2022-05-12 DIAGNOSIS — K2970 Gastritis, unspecified, without bleeding: Secondary | ICD-10-CM | POA: Diagnosis not present

## 2022-05-19 DIAGNOSIS — D125 Benign neoplasm of sigmoid colon: Secondary | ICD-10-CM | POA: Diagnosis not present

## 2022-05-19 DIAGNOSIS — D122 Benign neoplasm of ascending colon: Secondary | ICD-10-CM | POA: Diagnosis not present

## 2022-05-19 DIAGNOSIS — D124 Benign neoplasm of descending colon: Secondary | ICD-10-CM | POA: Diagnosis not present

## 2022-05-19 DIAGNOSIS — D123 Benign neoplasm of transverse colon: Secondary | ICD-10-CM | POA: Diagnosis not present

## 2022-05-19 DIAGNOSIS — K2289 Other specified disease of esophagus: Secondary | ICD-10-CM | POA: Diagnosis not present

## 2022-05-19 DIAGNOSIS — K319 Disease of stomach and duodenum, unspecified: Secondary | ICD-10-CM | POA: Diagnosis not present

## 2023-02-06 ENCOUNTER — Other Ambulatory Visit: Payer: Self-pay | Admitting: Physician Assistant

## 2023-02-06 ENCOUNTER — Ambulatory Visit
Admission: RE | Admit: 2023-02-06 | Discharge: 2023-02-06 | Disposition: A | Discharge status: Home / Self Care | Payer: Medicare Other | Source: Ambulatory Visit | Attending: Physician Assistant | Admitting: Physician Assistant

## 2023-02-06 DIAGNOSIS — Z0000 Encounter for general adult medical examination without abnormal findings: Secondary | ICD-10-CM | POA: Diagnosis not present

## 2023-02-06 DIAGNOSIS — M19041 Primary osteoarthritis, right hand: Secondary | ICD-10-CM | POA: Diagnosis not present

## 2023-02-06 DIAGNOSIS — I129 Hypertensive chronic kidney disease with stage 1 through stage 4 chronic kidney disease, or unspecified chronic kidney disease: Secondary | ICD-10-CM | POA: Diagnosis not present

## 2023-02-06 DIAGNOSIS — M79644 Pain in right finger(s): Secondary | ICD-10-CM | POA: Diagnosis not present

## 2023-02-06 DIAGNOSIS — M109 Gout, unspecified: Secondary | ICD-10-CM | POA: Diagnosis not present

## 2023-02-06 DIAGNOSIS — F17200 Nicotine dependence, unspecified, uncomplicated: Secondary | ICD-10-CM | POA: Diagnosis not present

## 2023-02-06 DIAGNOSIS — N1831 Chronic kidney disease, stage 3a: Secondary | ICD-10-CM | POA: Diagnosis not present

## 2023-02-06 DIAGNOSIS — E7800 Pure hypercholesterolemia, unspecified: Secondary | ICD-10-CM | POA: Diagnosis not present

## 2023-02-21 DIAGNOSIS — R109 Unspecified abdominal pain: Secondary | ICD-10-CM | POA: Diagnosis not present

## 2023-02-21 DIAGNOSIS — M549 Dorsalgia, unspecified: Secondary | ICD-10-CM | POA: Diagnosis not present

## 2023-02-21 DIAGNOSIS — R197 Diarrhea, unspecified: Secondary | ICD-10-CM | POA: Diagnosis not present

## 2023-02-21 DIAGNOSIS — R112 Nausea with vomiting, unspecified: Secondary | ICD-10-CM | POA: Diagnosis not present

## 2023-02-21 DIAGNOSIS — E7800 Pure hypercholesterolemia, unspecified: Secondary | ICD-10-CM | POA: Diagnosis not present

## 2023-02-21 DIAGNOSIS — M109 Gout, unspecified: Secondary | ICD-10-CM | POA: Diagnosis not present

## 2023-02-21 DIAGNOSIS — I129 Hypertensive chronic kidney disease with stage 1 through stage 4 chronic kidney disease, or unspecified chronic kidney disease: Secondary | ICD-10-CM | POA: Diagnosis not present

## 2023-03-01 DIAGNOSIS — M79644 Pain in right finger(s): Secondary | ICD-10-CM | POA: Diagnosis not present

## 2023-03-01 DIAGNOSIS — M19049 Primary osteoarthritis, unspecified hand: Secondary | ICD-10-CM | POA: Diagnosis not present

## 2023-04-03 DIAGNOSIS — M79644 Pain in right finger(s): Secondary | ICD-10-CM | POA: Diagnosis not present

## 2023-04-03 DIAGNOSIS — M7021 Olecranon bursitis, right elbow: Secondary | ICD-10-CM | POA: Diagnosis not present

## 2023-04-03 DIAGNOSIS — M19041 Primary osteoarthritis, right hand: Secondary | ICD-10-CM | POA: Diagnosis not present

## 2023-05-01 DIAGNOSIS — M7021 Olecranon bursitis, right elbow: Secondary | ICD-10-CM | POA: Diagnosis not present

## 2023-05-01 DIAGNOSIS — M19041 Primary osteoarthritis, right hand: Secondary | ICD-10-CM | POA: Diagnosis not present

## 2023-10-09 ENCOUNTER — Ambulatory Visit
Admission: EM | Admit: 2023-10-09 | Discharge: 2023-10-09 | Disposition: A | Discharge status: Home / Self Care | Payer: Medicare Other

## 2023-10-09 DIAGNOSIS — S46911A Strain of unspecified muscle, fascia and tendon at shoulder and upper arm level, right arm, initial encounter: Secondary | ICD-10-CM | POA: Diagnosis not present

## 2023-10-09 MED ORDER — TIZANIDINE HCL 4 MG PO TABS: 4.0000 mg | ORAL_TABLET | Freq: Three times a day (TID) | ORAL | 0 refills | Status: DC | PRN

## 2023-10-09 MED ORDER — KETOROLAC TROMETHAMINE 30 MG/ML IJ SOLN
30.0000 mg | Freq: Once | INTRAMUSCULAR | Status: AC
  Administered 2023-10-09: 30 mg via INTRAMUSCULAR

## 2023-10-09 NOTE — ED Provider Notes (Signed)
[facility]    CSN: [id number] Arrival date & time: 10/09/23  1702      History   Chief Complaint No chief complaint on file.   HPI Tony Lopez is a 72 y.o. male.   Patient presents today with right shoulder pain that began when pulling himself into his truck earlier today.  Reports the pain is severe and is located in the right shoulder, does not radiate to the fingertips.  Denies decreased range of motion of the shoulder or swelling/bruising to the area.  Reports he drove home from [place] today and drove with his right arm extended with shoulder flexed behind his head which did seem to help alleviate a little bit of the pain.  Has not taken anything orally so far her pain.  Denies history of shoulder surgeries.    Past Medical History:  Diagnosis Date   Arthritis    GERD (gastroesophageal reflux disease)    Hepatitis C    Hypertension    Prostate cancer [facility])     Patient Active Problem List   Diagnosis Date Noted   Hepatitis C antibody positive in blood 02/26/2018   Prostate cancer (HCC) 10/05/2017   Prostatic adenocarcinoma (HCC) 04/30/2017    Past Surgical History:  Procedure Laterality Date   BACK SURGERY  1999   CYSTOSCOPY WITH INJECTION N/A 10/05/2017   Procedure: CYSTOSCOPY WITH INJECTION INDOCYANINE GREEN DYE;  Surgeon: Sebastian Ache, MD;  Location: [facility];  Service: Urology;  Laterality: N/A;   KNEE SURGERY Left early 90s   CARTILAGE CLEAN OUT    LYMPHADENECTOMY Bilateral 10/05/2017   Procedure: PELVIC LYMPHADENECTOMY;  Surgeon: Sebastian Ache, MD;  Location: [facility];  Service: Urology;  Laterality: Bilateral;   ROBOT ASSISTED LAPAROSCOPIC RADICAL PROSTATECTOMY N/A 10/05/2017   Procedure: XI ROBOTIC ASSISTED LAPAROSCOPIC RADICAL PROSTATECTOMY;  Surgeon: Sebastian Ache, MD;  Location: [facility];  Service: Urology;  Laterality: N/A;       Home Medications    Prior to Admission medications   Medication Sig Start Date End Date Taking?  Authorizing Provider  tiZANidine (ZANAFLEX) 4 MG tablet Take 1 tablet (4 mg total) by mouth every 8 (eight) hours as needed for muscle spasms. Do not take with alcohol or while driving or operating heavy machinery.  May cause drowsiness. 10/09/23  Yes Valentino Nose, NP  acetaminophen (TYLENOL) 500 MG tablet Take 1 tablet (500 mg total) by mouth every 6 (six) hours as needed. 08/21/20   Avegno, Zachery Dakins, FNP  allopurinol (ZYLOPRIM) 300 MG tablet Take 300 mg by mouth daily.    [provider]  amLODipine (NORVASC) 5 MG tablet Take 5 mg by mouth daily.    [provider]  atorvastatin (LIPITOR) 10 MG tablet Take 10 mg by mouth daily.    [provider]  lisinopril-hydrochlorothiazide (PRINZIDE,ZESTORETIC) 20-12.5 MG tablet Take 1 tablet by mouth daily.    [provider]  ranitidine (ZANTAC) 150 MG tablet Take 150 mg 2 (two) times daily by mouth.    [provider]    Family History Family History  Problem Relation Age of Onset   Lung cancer Mother    Prostate cancer Neg Hx     Social History Social History   Tobacco Use   Smoking status: Every Day    Current packs/day: 1.00    Average packs/day: 1 pack/day for 35.0 years (35.0 ttl pk-yrs)    Types: Cigarettes   Smokeless tobacco: Never  Substance Use Topics   Alcohol  use: Yes    Comment: SELDOM    Drug use: No     Allergies   Patient has no known allergies.   Review of Systems Review of Systems Per HPI  Physical Exam Triage Vital Signs ED Triage Vitals  Encounter Vitals Group     BP 10/09/23 1711 (!) 195/90     Systolic BP Percentile --      Diastolic BP Percentile --      Pulse Rate 10/09/23 1711 99     Resp 10/09/23 1711 18     Temp 10/09/23 1711 98.8 F (37.1 C)     Temp Source 10/09/23 1711 Oral     SpO2 10/09/23 1711 92 %     Weight --      Height --      Head Circumference --      Peak Flow --      Pain Score 10/09/23 1714 10     Pain Loc --      Pain  Education --      Exclude from Growth Chart --    No data found.  Updated Vital Signs BP (!) 195/90 (BP Location: Right Arm)   Pulse 99   Temp 98.8 F (37.1 C) (Oral)   Resp 18   SpO2 92%   Visual Acuity Right Eye Distance:   Left Eye Distance:   Bilateral Distance:    Right Eye Near:   Left Eye Near:    Bilateral Near:     Physical Exam Vitals and nursing note reviewed.  Constitutional:      General: He is not in acute distress.    Appearance: Normal appearance. He is not toxic-appearing.  Pulmonary:     Effort: Pulmonary effort is normal. No respiratory distress.  Musculoskeletal:     Comments: Inspection: no swelling, bruising, obvious deformity or redness to right shoulder, trapezius, biceps Palpation: tender to palpation right trapezius and shoulder diffusely; no obvious deformities palpated ROM: Full ROM to right shoulder Strength: 5/5 bilateral upper extremities Neurovascular: neurovascularly intact in bilateral upper extremities  Skin:    General: Skin is warm and dry.     Capillary Refill: Capillary refill takes less than 2 seconds.     Coloration: Skin is not jaundiced or pale.     Findings: No erythema.  Neurological:     Mental Status: He is alert and oriented to person, place, and time.  Psychiatric:        Behavior: Behavior is cooperative.      UC Treatments / Results  Labs (all labs ordered are listed, but only abnormal results are displayed) Labs Reviewed - No data to display  EKG   Radiology No results found.  Procedures Procedures (including critical care time)  Medications Ordered in UC Medications  ketorolac (TORADOL) 30 MG/ML injection 30 mg (has no administration in time range)    Initial Impression / Assessment and Plan / UC Course  I have reviewed the triage vital signs and the nursing notes.  Pertinent labs & imaging results that were available during my care of the patient were reviewed by me and considered in my medical  decision making (see chart for details).   Patient is well-appearing, afebrile, not tachycardic, not tachypneic, oxygenating well on room air.  Patient is hypertensive in triage today.  Patient reports he has taken his blood pressure medication today.  Reports he recently had a PCP visit and blood pressure was 124/78.  Elevated blood pressure today could be  attributed to pain.  1. Strain of right shoulder, initial encounter Treat with Toradol 30 mg IM in urgent care today, avoid NSAIDs for 48 hours Start Tylenol 500 to 1000 mg every 6 hours, tizanidine as needed, light range of motion/retching exercises, heat/ice Recommended follow-up with PCP if symptoms persist or worsen despite treatment  The patient was given the opportunity to ask questions.  All questions answered to their satisfaction.  The patient is in agreement to this plan.    Final Clinical Impressions(s) / UC Diagnoses   Final diagnoses:  Strain of right shoulder, initial encounter     Discharge Instructions      As we discussed, I suspect you have strained your right shoulder.  We gave you an injection of Toradol today to help with pain and inflammation.  Avoid NSAIDs (Motrin, ibuprofen, Aleve, etc.) for next 48 hours.  You can take Tylenol 500 to 1000 mg every 6 hours as needed for pain.  You can also take the tizanidine as needed for muscular pain.  Seek care if symptoms persist or worsen despite treatment.   ED Prescriptions     Medication Sig Dispense Auth. Provider   tiZANidine (ZANAFLEX) 4 MG tablet Take 1 tablet (4 mg total) by mouth every 8 (eight) hours as needed for muscle spasms. Do not take with alcohol or while driving or operating heavy machinery.  May cause drowsiness. 30 tablet Valentino Nose, NP      PDMP not reviewed this encounter.   Valentino Nose, NP 10/09/23 1733

## 2023-10-09 NOTE — ED Triage Notes (Signed)
Pt reports right shoulder pain onset today, states he was getting in his truck and as he was pulling him up he felt a stabbing pain in his right shoulder, and the pain has been present since.

## 2023-10-09 NOTE — Discharge Instructions (Signed)
As we discussed, I suspect you have strained your right shoulder.  We gave you an injection of Toradol today to help with pain and inflammation.  Avoid NSAIDs (Motrin, ibuprofen, Aleve, etc.) for next 48 hours.  You can take Tylenol 500 to 1000 mg every 6 hours as needed for pain.  You can also take the tizanidine as needed for muscular pain.  Seek care if symptoms persist or worsen despite treatment.

## 2023-10-12 DIAGNOSIS — N183 Chronic kidney disease, stage 3 unspecified: Secondary | ICD-10-CM | POA: Diagnosis not present

## 2023-10-12 DIAGNOSIS — M25511 Pain in right shoulder: Secondary | ICD-10-CM | POA: Diagnosis not present

## 2023-10-12 DIAGNOSIS — I129 Hypertensive chronic kidney disease with stage 1 through stage 4 chronic kidney disease, or unspecified chronic kidney disease: Secondary | ICD-10-CM | POA: Diagnosis not present

## 2023-10-12 DIAGNOSIS — M62838 Other muscle spasm: Secondary | ICD-10-CM | POA: Diagnosis not present

## 2023-10-16 DIAGNOSIS — M542 Cervicalgia: Secondary | ICD-10-CM | POA: Diagnosis not present

## 2023-10-16 DIAGNOSIS — M25511 Pain in right shoulder: Secondary | ICD-10-CM | POA: Diagnosis not present

## 2023-10-17 DIAGNOSIS — M542 Cervicalgia: Secondary | ICD-10-CM | POA: Diagnosis not present

## 2023-10-19 DIAGNOSIS — M542 Cervicalgia: Secondary | ICD-10-CM | POA: Diagnosis not present

## 2023-10-19 DIAGNOSIS — M25511 Pain in right shoulder: Secondary | ICD-10-CM | POA: Diagnosis not present

## 2023-10-29 DIAGNOSIS — M5412 Radiculopathy, cervical region: Secondary | ICD-10-CM | POA: Diagnosis not present

## 2023-11-12 DIAGNOSIS — M5412 Radiculopathy, cervical region: Secondary | ICD-10-CM | POA: Diagnosis not present

## 2023-11-27 DIAGNOSIS — M542 Cervicalgia: Secondary | ICD-10-CM | POA: Diagnosis not present

## 2024-02-12 ENCOUNTER — Encounter: Payer: Self-pay | Admitting: *Deleted

## 2024-04-18 ENCOUNTER — Emergency Department (HOSPITAL_COMMUNITY)

## 2024-04-18 ENCOUNTER — Inpatient Hospital Stay (HOSPITAL_COMMUNITY)
Admission: EM | Admit: 2024-04-18 | Discharge: 2024-05-02 | DRG: 417 | Disposition: A | Discharge status: Skilled Nursing Facility | Attending: Internal Medicine | Admitting: Internal Medicine

## 2024-04-18 ENCOUNTER — Encounter (HOSPITAL_COMMUNITY): Payer: Self-pay | Admitting: Emergency Medicine

## 2024-04-18 ENCOUNTER — Other Ambulatory Visit: Payer: Self-pay

## 2024-04-18 ENCOUNTER — Ambulatory Visit (INDEPENDENT_AMBULATORY_CARE_PROVIDER_SITE_OTHER)
Admission: EM | Admit: 2024-04-18 | Discharge: 2024-04-18 | Disposition: A | Discharge status: Other Acute Inpatient Hospital | Source: Home / Self Care

## 2024-04-18 DIAGNOSIS — E860 Dehydration: Secondary | ICD-10-CM | POA: Diagnosis present

## 2024-04-18 DIAGNOSIS — D62 Acute posthemorrhagic anemia: Secondary | ICD-10-CM | POA: Diagnosis not present

## 2024-04-18 DIAGNOSIS — K7010 Alcoholic hepatitis without ascites: Secondary | ICD-10-CM | POA: Diagnosis present

## 2024-04-18 DIAGNOSIS — F05 Delirium due to known physiological condition: Secondary | ICD-10-CM | POA: Diagnosis present

## 2024-04-18 DIAGNOSIS — K9181 Other intraoperative complications of digestive system: Secondary | ICD-10-CM | POA: Diagnosis not present

## 2024-04-18 DIAGNOSIS — D6489 Other specified anemias: Secondary | ICD-10-CM | POA: Diagnosis present

## 2024-04-18 DIAGNOSIS — B1920 Unspecified viral hepatitis C without hepatic coma: Secondary | ICD-10-CM | POA: Diagnosis present

## 2024-04-18 DIAGNOSIS — R2681 Unsteadiness on feet: Secondary | ICD-10-CM | POA: Insufficient documentation

## 2024-04-18 DIAGNOSIS — N179 Acute kidney failure, unspecified: Secondary | ICD-10-CM | POA: Diagnosis present

## 2024-04-18 DIAGNOSIS — F1010 Alcohol abuse, uncomplicated: Secondary | ICD-10-CM

## 2024-04-18 DIAGNOSIS — R7881 Bacteremia: Secondary | ICD-10-CM | POA: Diagnosis present

## 2024-04-18 DIAGNOSIS — F10931 Alcohol use, unspecified with withdrawal delirium: Secondary | ICD-10-CM | POA: Diagnosis not present

## 2024-04-18 DIAGNOSIS — K812 Acute cholecystitis with chronic cholecystitis: Secondary | ICD-10-CM | POA: Diagnosis not present

## 2024-04-18 DIAGNOSIS — K839 Disease of biliary tract, unspecified: Secondary | ICD-10-CM | POA: Diagnosis not present

## 2024-04-18 DIAGNOSIS — K852 Alcohol induced acute pancreatitis without necrosis or infection: Secondary | ICD-10-CM | POA: Diagnosis present

## 2024-04-18 DIAGNOSIS — K8000 Calculus of gallbladder with acute cholecystitis without obstruction: Secondary | ICD-10-CM | POA: Diagnosis not present

## 2024-04-18 DIAGNOSIS — B9689 Other specified bacterial agents as the cause of diseases classified elsewhere: Secondary | ICD-10-CM | POA: Diagnosis present

## 2024-04-18 DIAGNOSIS — D6959 Other secondary thrombocytopenia: Secondary | ICD-10-CM | POA: Diagnosis present

## 2024-04-18 DIAGNOSIS — K821 Hydrops of gallbladder: Secondary | ICD-10-CM | POA: Diagnosis present

## 2024-04-18 DIAGNOSIS — J869 Pyothorax without fistula: Secondary | ICD-10-CM | POA: Diagnosis present

## 2024-04-18 DIAGNOSIS — F10239 Alcohol dependence with withdrawal, unspecified: Secondary | ICD-10-CM | POA: Insufficient documentation

## 2024-04-18 DIAGNOSIS — D630 Anemia in neoplastic disease: Secondary | ICD-10-CM | POA: Diagnosis present

## 2024-04-18 DIAGNOSIS — F10231 Alcohol dependence with withdrawal delirium: Secondary | ICD-10-CM | POA: Diagnosis present

## 2024-04-18 DIAGNOSIS — F17210 Nicotine dependence, cigarettes, uncomplicated: Secondary | ICD-10-CM | POA: Diagnosis present

## 2024-04-18 DIAGNOSIS — K922 Gastrointestinal hemorrhage, unspecified: Secondary | ICD-10-CM | POA: Diagnosis present

## 2024-04-18 DIAGNOSIS — K8066 Calculus of gallbladder and bile duct with acute and chronic cholecystitis without obstruction: Principal | ICD-10-CM | POA: Diagnosis present

## 2024-04-18 DIAGNOSIS — R54 Age-related physical debility: Secondary | ICD-10-CM | POA: Diagnosis present

## 2024-04-18 DIAGNOSIS — F10939 Alcohol use, unspecified with withdrawal, unspecified: Principal | ICD-10-CM | POA: Diagnosis present

## 2024-04-18 DIAGNOSIS — G8929 Other chronic pain: Secondary | ICD-10-CM | POA: Diagnosis present

## 2024-04-18 DIAGNOSIS — K810 Acute cholecystitis: Secondary | ICD-10-CM | POA: Insufficient documentation

## 2024-04-18 DIAGNOSIS — I10 Essential (primary) hypertension: Secondary | ICD-10-CM | POA: Diagnosis present

## 2024-04-18 DIAGNOSIS — D75839 Thrombocytosis, unspecified: Secondary | ICD-10-CM | POA: Diagnosis present

## 2024-04-18 DIAGNOSIS — G312 Degeneration of nervous system due to alcohol: Secondary | ICD-10-CM | POA: Diagnosis present

## 2024-04-18 DIAGNOSIS — C61 Malignant neoplasm of prostate: Secondary | ICD-10-CM | POA: Diagnosis present

## 2024-04-18 DIAGNOSIS — E871 Hypo-osmolality and hyponatremia: Secondary | ICD-10-CM | POA: Diagnosis present

## 2024-04-18 DIAGNOSIS — R400 Somnolence: Principal | ICD-10-CM

## 2024-04-18 DIAGNOSIS — Z801 Family history of malignant neoplasm of trachea, bronchus and lung: Secondary | ICD-10-CM

## 2024-04-18 DIAGNOSIS — B9620 Unspecified Escherichia coli [E. coli] as the cause of diseases classified elsewhere: Secondary | ICD-10-CM | POA: Diagnosis present

## 2024-04-18 DIAGNOSIS — Z9079 Acquired absence of other genital organ(s): Secondary | ICD-10-CM

## 2024-04-18 DIAGNOSIS — R768 Other specified abnormal immunological findings in serum: Secondary | ICD-10-CM | POA: Diagnosis present

## 2024-04-18 DIAGNOSIS — R441 Visual hallucinations: Secondary | ICD-10-CM | POA: Diagnosis present

## 2024-04-18 DIAGNOSIS — Z635 Disruption of family by separation and divorce: Secondary | ICD-10-CM

## 2024-04-18 DIAGNOSIS — E8721 Acute metabolic acidosis: Secondary | ICD-10-CM | POA: Diagnosis present

## 2024-04-18 DIAGNOSIS — I2489 Other forms of acute ischemic heart disease: Secondary | ICD-10-CM | POA: Diagnosis present

## 2024-04-18 DIAGNOSIS — F1090 Alcohol use, unspecified, uncomplicated: Secondary | ICD-10-CM

## 2024-04-18 DIAGNOSIS — K760 Fatty (change of) liver, not elsewhere classified: Secondary | ICD-10-CM | POA: Diagnosis present

## 2024-04-18 DIAGNOSIS — Z79899 Other long term (current) drug therapy: Secondary | ICD-10-CM

## 2024-04-18 DIAGNOSIS — E876 Hypokalemia: Secondary | ICD-10-CM | POA: Diagnosis present

## 2024-04-18 LAB — COMPREHENSIVE METABOLIC PANEL WITH GFR
ALT: 139 U/L — ABNORMAL HIGH (ref 0–44)
AST: 254 U/L — ABNORMAL HIGH (ref 15–41)
Albumin: 3.3 g/dL — ABNORMAL LOW (ref 3.5–5.0)
Alkaline Phosphatase: 144 U/L — ABNORMAL HIGH (ref 38–126)
Anion gap: 26 — ABNORMAL HIGH (ref 5–15)
BUN: 50 mg/dL — ABNORMAL HIGH (ref 8–23)
CO2: 17 mmol/L — ABNORMAL LOW (ref 22–32)
Calcium: 8.9 mg/dL (ref 8.9–10.3)
Chloride: 85 mmol/L — ABNORMAL LOW (ref 98–111)
Creatinine, Ser: 2.44 mg/dL — ABNORMAL HIGH (ref 0.61–1.24)
GFR, Estimated: 28 mL/min — ABNORMAL LOW (ref >60)
Glucose, Bld: 127 mg/dL — ABNORMAL HIGH (ref 70–99)
Potassium: 4.1 mmol/L (ref 3.5–5.1)
Sodium: 128 mmol/L — ABNORMAL LOW (ref 135–145)
Total Bilirubin: 6 mg/dL — ABNORMAL HIGH (ref 0.0–1.2)
Total Protein: 6.1 g/dL — ABNORMAL LOW (ref 6.5–8.1)

## 2024-04-18 LAB — CBC
HCT: 28.1 % — ABNORMAL LOW (ref 39.0–52.0)
Hemoglobin: 9.9 g/dL — ABNORMAL LOW (ref 13.0–17.0)
MCH: 33.2 pg (ref 26.0–34.0)
MCHC: 35.2 g/dL (ref 30.0–36.0)
MCV: 94.3 fL (ref 80.0–100.0)
Platelets: 46 10*3/uL — ABNORMAL LOW (ref 150–400)
RBC: 2.98 MIL/uL — ABNORMAL LOW (ref 4.22–5.81)
RDW: 12.9 % (ref 11.5–15.5)
WBC: 7.6 10*3/uL (ref 4.0–10.5)
nRBC: 0.3 % — ABNORMAL HIGH (ref 0.0–0.2)

## 2024-04-18 LAB — POC OCCULT BLOOD, ED: Fecal Occult Bld: POSITIVE — AB

## 2024-04-18 LAB — LIPASE, BLOOD: Lipase: 253 U/L — ABNORMAL HIGH (ref 11–51)

## 2024-04-18 LAB — CBC WITH DIFFERENTIAL/PLATELET
Abs Immature Granulocytes: 0.17 10*3/uL — ABNORMAL HIGH (ref 0.00–0.07)
Basophils Absolute: 0 10*3/uL (ref 0.0–0.1)
Basophils Relative: 0 %
Eosinophils Absolute: 0 10*3/uL (ref 0.0–0.5)
Eosinophils Relative: 0 %
HCT: 31.3 % — ABNORMAL LOW (ref 39.0–52.0)
Hemoglobin: 11.1 g/dL — ABNORMAL LOW (ref 13.0–17.0)
Immature Granulocytes: 2 %
Lymphocytes Relative: 4 %
Lymphs Abs: 0.4 10*3/uL — ABNORMAL LOW (ref 0.7–4.0)
MCH: 33.6 pg (ref 26.0–34.0)
MCHC: 35.5 g/dL (ref 30.0–36.0)
MCV: 94.8 fL (ref 80.0–100.0)
Monocytes Absolute: 1 10*3/uL (ref 0.1–1.0)
Monocytes Relative: 9 %
Neutro Abs: 9.1 10*3/uL — ABNORMAL HIGH (ref 1.7–7.7)
Neutrophils Relative %: 85 %
Platelets: 66 10*3/uL — ABNORMAL LOW (ref 150–400)
RBC: 3.3 MIL/uL — ABNORMAL LOW (ref 4.22–5.81)
RDW: 12.9 % (ref 11.5–15.5)
Smear Review: DECREASED
WBC: 10.7 10*3/uL — ABNORMAL HIGH (ref 4.0–10.5)
nRBC: 0.5 % — ABNORMAL HIGH (ref 0.0–0.2)

## 2024-04-18 LAB — ACETAMINOPHEN LEVEL: Acetaminophen (Tylenol), Serum: <10 ug/mL — ABNORMAL LOW (ref 10–30)

## 2024-04-18 LAB — ETHANOL: Alcohol, Ethyl (B): <15 mg/dL (ref <15)

## 2024-04-18 LAB — PROTIME-INR
INR: 0.9 (ref 0.8–1.2)
Prothrombin Time: 12.6 s (ref 11.4–15.2)

## 2024-04-18 LAB — TROPONIN I (HIGH SENSITIVITY)
Troponin I (High Sensitivity): 58 ng/L — ABNORMAL HIGH (ref <18)
Troponin I (High Sensitivity): 57 ng/L — ABNORMAL HIGH (ref <18)

## 2024-04-18 LAB — SALICYLATE LEVEL: Salicylate Lvl: <7 mg/dL — ABNORMAL LOW (ref 7.0–30.0)

## 2024-04-18 MED ORDER — SODIUM CHLORIDE 0.9 % IV BOLUS
1000.0000 mL | Freq: Once | INTRAVENOUS | Status: AC
  Administered 2024-04-18: 1000 mL via INTRAVENOUS

## 2024-04-18 MED ORDER — LORAZEPAM 1 MG PO TABS
0.0000 mg | ORAL_TABLET | Freq: Two times a day (BID) | ORAL | Status: DC
Start: 2024-04-20 — End: 2024-04-19

## 2024-04-18 MED ORDER — LORAZEPAM 2 MG/ML IJ SOLN
0.0000 mg | Freq: Four times a day (QID) | INTRAMUSCULAR | Status: DC
  Administered 2024-04-18: 2 mg via INTRAVENOUS
  Administered 2024-04-19: 1 mg via INTRAVENOUS
  Filled 2024-04-18 (×3): qty 1

## 2024-04-18 MED ORDER — THIAMINE MONONITRATE 100 MG PO TABS: 100.0000 mg | ORAL_TABLET | Freq: Every day | ORAL | Status: DC

## 2024-04-18 MED ORDER — LORAZEPAM 2 MG/ML IJ SOLN
0.0000 mg | Freq: Two times a day (BID) | INTRAMUSCULAR | Status: DC
Start: 2024-04-20 — End: 2024-04-19

## 2024-04-18 MED ORDER — ONDANSETRON HCL 4 MG/2ML IJ SOLN: 4.0000 mg | Freq: Four times a day (QID) | INTRAMUSCULAR | Status: DC | PRN

## 2024-04-18 MED ORDER — THIAMINE HCL 100 MG/ML IJ SOLN
100.0000 mg | Freq: Every day | INTRAMUSCULAR | Status: DC
  Administered 2024-04-18: 100 mg via INTRAVENOUS
  Filled 2024-04-18: qty 2

## 2024-04-18 MED ORDER — PANTOPRAZOLE SODIUM 40 MG IV SOLR
40.0000 mg | Freq: Two times a day (BID) | INTRAVENOUS | Status: DC
  Administered 2024-04-18 – 2024-04-29 (×21): 40 mg via INTRAVENOUS
  Filled 2024-04-18 (×21): qty 10

## 2024-04-18 MED ORDER — SODIUM CHLORIDE 0.9 % IV BOLUS
500.0000 mL | Freq: Once | INTRAVENOUS | Status: AC
  Administered 2024-04-18: 500 mL via INTRAVENOUS

## 2024-04-18 MED ORDER — ONDANSETRON HCL 4 MG PO TABS: 4.0000 mg | ORAL_TABLET | Freq: Four times a day (QID) | ORAL | Status: DC | PRN

## 2024-04-18 MED ORDER — ALLOPURINOL 100 MG PO TABS
100.0000 mg | ORAL_TABLET | Freq: Every day | ORAL | Status: DC
  Administered 2024-04-18 – 2024-05-02 (×10): 100 mg via ORAL
  Filled 2024-04-18 (×10): qty 1

## 2024-04-18 MED ORDER — LORAZEPAM 1 MG PO TABS: 0.0000 mg | ORAL_TABLET | Freq: Four times a day (QID) | ORAL | Status: DC

## 2024-04-18 NOTE — ED Notes (Signed)
 Attempted to call [facility] x3 for report

## 2024-04-18 NOTE — Discharge Summary (Signed)
 Pt transferred to [facility] due to possible DTs and for detox. Pt was transported via EMS.

## 2024-04-18 NOTE — ED Notes (Signed)
 Report given to Tori RN at [facility]

## 2024-04-18 NOTE — ED Provider Notes (Signed)
 4:13 PM Care assumed from Dr. Dolan Freiberg.  At time of transfer of care, patient is awaiting results of CT abdomen pelvis prior to admission for withdrawals, AKI, LFT elevation, and delirium.  6:18 PM CT returned without acute surgical problem or acute inflammation.  His CT did show a possible renal hemorrhagic cyst or other abnormality that will need possible ultrasound and further workup.  Patient's labs do show evidence of AKI, now show some degree of pancreatitis with elevated lipase, and he has the fecal occult positive stool.  He has an undetectable alcohol level and per family, they decreased his drinking to 1/5 of liquor a day down from much more that he was having daily.  He is having hallucinations and his CIWA was 14.  He is now resting after Ativan.  Will call for medical admission for medical stabilization and ultimately may need psychiatric evaluation for his substance troubles.     Jeter Tomey, Marine Sia, MD 04/18/24 [phone]

## 2024-04-18 NOTE — ED Provider Notes (Signed)
 Behavioral Health Urgent Care Medical Screening Exam  Patient Name: Tony Lopez MRN: [id number] Date of Evaluation: 04/18/24 Chief Complaint:  Alcohol withdrawal symptoms Diagnosis:  Final diagnoses:  Alcohol use disorder   History of Present illness: Tony Lopez is a 73 y.o. male with a reported prior mental health history of alcohol use disorder who presents to the [facility] seeking detox from alcohol.  Assessment: Patient is accompanied by his ex-wife who participates in the assessment with pt's consent. Pt & ex wife report that pt has been drinking scotch whiskey since November 2024, and has been drinking a quart of this every 2 days. Reports last alcoholic drink as being last night at 11pm.  Pt denies any other substance use, states to writer that he saw a cat in the lobby at the [facility], and that he also saw a swamp of bees & bugs fly by, which is concerning for delirium tremens in the context of his high alcohol use. Pt states that this is the first time that he has had VH. He denies AH. Denies SI/HI. Denies paranoia.   Appearance is frail looking, face appears flushed, appears emaciated and ill looking. Ex wife states that he has not been eating, and had nausea earlier today morning. Ex wife states that pt has not been eating well, and two days ago, called her because he was unable to get off the floor because he had fallen and was unable to get up by himself. Pt does not recall falling. He is seated in a wheelchair currently due to a grossly unstable gait, which is not typical for him as per pt and ex wife. Both state that he is typically ambulatory without issues. Pt reports a history of a herniated disc which he sustained last year, but then states that he was never evaluated for this and just knows that he has one. Reports medical history of hypertension.  Recommendations:  Sending patient to the ER where he can be started on an alcohol detox taper, and monitored for at least  24 hrs prior to returning to the [facility]. Pt may return and be admitted to the [facility] here at the [facility] after he has been started on an alcohol detox protocol and is doing well for at least 24 hrs, and is able to take care of own ADLs, and is fully ambulatory.  Flowsheet Row ED from 04/18/2024 in [facility] from 10/09/2023 in [facility] at [place]  C-SSRS RISK CATEGORY No Risk No Risk       Psychiatric Specialty Exam  Presentation  General Appearance:Disheveled  Eye Contact:Fair  Speech:Clear and Coherent  Speech Volume:Normal  Handedness:Right   Mood and Affect  Mood:Depressed; Anxious  Affect:Congruent   Thought Process  Thought Processes:Coherent  Descriptions of Associations:Intact  Orientation:None  Thought Content:Illogical    Hallucinations:Visual seeing cats and bugs  Ideas of Reference:None  Suicidal Thoughts:No  Homicidal Thoughts:No   Sensorium  Memory:Immediate Fair  Judgment:Fair  Insight:Fair   Executive Functions  Concentration:Fair  Attention Span:Fair  Recall:Fair  Fund of Knowledge:Fair  Language:Fair   Psychomotor Activity  Psychomotor Activity:Decreased   Assets  Assets:Resilience   Sleep  Sleep:Poor  Number of hours: No data recorded  Physical Exam: Physical Exam Vitals reviewed.  Neurological:     General: No focal deficit present.     Mental Status: He is oriented to person, place, and time.    Review of Systems  Psychiatric/Behavioral:  Positive for depression and  substance abuse. Negative for hallucinations, memory loss and suicidal ideas. The patient is nervous/anxious and has insomnia.   All other systems reviewed and are negative.  Blood pressure (!) 145/98, pulse 89, temperature 97.7 F (36.5 C), resp. rate 19, SpO2 99%. There is no height or weight on file to calculate BMI.  Musculoskeletal: Strength & Muscle Tone: within normal limits Gait &  Station: normal Patient leans: N/A  [facility] MSE Discharge Disposition for Follow up and Recommendations: Based on my evaluation the patient appears to have an emergency medical condition for which I recommend the patient be transferred to the emergency department for further evaluation.   Sending patient to the ER where he can be started on an alcohol detox taper, and monitored for at least 24 hrs prior to returning to the [facility]. Pt may return and be admitted to the [facility] here at the [facility] after he has been started on an alcohol detox protocol and is doing well for at least 24 hrs, and is able to take care of own ADLs, and is fully ambulatory.  Robet Chiquito, NP 04/18/2024, 12:30 PM

## 2024-04-18 NOTE — ED Provider Notes (Signed)
 Emergency Department Provider Note   I have reviewed the triage vital signs and the nursing notes.   HISTORY  Chief Complaint Medical Clearance (Medical clearance for [facility])   HPI Tony Lopez is a 73 y.o. male with PMH of EtOH abuse, HTN, and Hep C presents to the ED with his ex-wife for evaluation of visual hallucinations, shakes, weakness.  Symptoms have been developing over the past 7 days.  He has been drinking daily and extremely heavily since at least November of last year.  He had been a heavy drinker up until that point and his wife states was treating some chronic pain with alcohol.  He would typically drink around half gallon of liquor per day but his ex-wife states they are not exactly sure how much he was drinking because he essentially had on limited supply.  Over the past week they have taken over his alcohol and are giving him around 1/5 of liquor per day.  They have noticed the weakness and hallucinations progressively worsening since that time.  He has had 2 falls in the past 2 days and been too weak to get up off the floor.  No known history of DTs.  He initially went to [facility] but was transferred here with concern for possible DTs. He has been having some visual hallucinations such as seeing bugs crawling or shadows moving. Last drink was 11 pm yesterday.  He denies any specific chest pain. Notes some diffuse abdominal discomfort which is mild to moderate and non-bloody vomiting.  He has had some diarrhea which is black in color.   He denies any SI/HI or AH.    Past Medical History:  Diagnosis Date   Arthritis    GERD (gastroesophageal reflux disease)    Hepatitis C    Hypertension    Prostate cancer (HCC)     Review of Systems  Constitutional: No fever/chills. Positive weakness.  Cardiovascular: Denies chest pain. Respiratory: Denies shortness of breath. Gastrointestinal: Moderate abdominal pain.  Positive vomiting. Positive dark stool x 2-3 days.   Genitourinary: Negative for dysuria. Musculoskeletal: Negative for back pain. Skin: Negative for rash. Neurological: Negative for headaches, focal weakness or numbness. Positive VH.   ____________________________________________   PHYSICAL EXAM:  VITAL SIGNS: ED Triage Vitals  Encounter Vitals Group     BP 04/18/24 1334 (!) 132/118     Pulse Rate 04/18/24 1334 86     Resp 04/18/24 1334 17     Temp 04/18/24 1334 97.8 F (36.6 C)     Temp Source 04/18/24 1334 Oral     SpO2 04/18/24 1334 100 %     Weight 04/18/24 1322 205 lb 0.4 oz (93 kg)     Height 04/18/24 1322 6' (1.829 m)   Constitutional: Alert and oriented.  Disheveled appearing with muted affect but participating in exam.  Eyes: Conjunctivae are normal.  Head: Atraumatic. Nose: No congestion/rhinnorhea. Mouth/Throat: Mucous membranes are dry.  Cardiovascular: Normal rate, regular rhythm. Good peripheral circulation. Grossly normal heart sounds.   Respiratory: Normal respiratory effort.  No retractions. Lungs CTAB. Gastrointestinal: Soft with mild diffuse tenderness. No distention.  Rectal: Exam performed with nurse chaperone at bedside.  No gross blood or melena.  Patient with dark brown stool.  Musculoskeletal: No lower extremity tenderness nor edema. No gross deformities of extremities. Neurologic:  Normal speech and language. No gross focal neurologic deficits are appreciated.  Equal strength but some tremor with raising his arms.  Skin:  Skin is warm, dry and  intact. No rash noted.  CIWA 14.   ____________________________________________   LABS (all labs ordered are listed, but only abnormal results are displayed)  Labs Reviewed  COMPREHENSIVE METABOLIC PANEL WITH GFR - Abnormal; Notable for the following components:      Result Value   Sodium 128 (*)    Chloride 85 (*)    CO2 17 (*)    Glucose, Bld 127 (*)    BUN 50 (*)    Creatinine, Ser 2.44 (*)    Total Protein 6.1 (*)    Albumin 3.3 (*)    AST 254  (*)    ALT 139 (*)    Alkaline Phosphatase 144 (*)    Total Bilirubin 6.0 (*)    GFR, Estimated 28 (*)    Anion gap 26 (*)    All other components within normal limits  ACETAMINOPHEN  LEVEL - Abnormal; Notable for the following components:   Acetaminophen  (Tylenol ), Serum <10 (*)    All other components within normal limits  LIPASE, BLOOD - Abnormal; Notable for the following components:   Lipase 253 (*)    All other components within normal limits  CBC WITH DIFFERENTIAL/PLATELET - Abnormal; Notable for the following components:   WBC 10.7 (*)    RBC 3.30 (*)    Hemoglobin 11.1 (*)    HCT 31.3 (*)    Platelets 66 (*)    nRBC 0.5 (*)    Neutro Abs 9.1 (*)    Lymphs Abs 0.4 (*)    Abs Immature Granulocytes 0.17 (*)    All other components within normal limits  SALICYLATE LEVEL - Abnormal; Notable for the following components:   Salicylate Lvl <7.0 (*)    All other components within normal limits  COMPREHENSIVE METABOLIC PANEL WITH GFR - Abnormal; Notable for the following components:   Sodium 128 (*)    Potassium 3.4 (*)    Chloride 90 (*)    CO2 17 (*)    BUN 50 (*)    Creatinine, Ser 2.06 (*)    Calcium 8.3 (*)    Total Protein 5.0 (*)    Albumin 2.7 (*)    AST 178 (*)    ALT 103 (*)    Total Bilirubin 5.5 (*)    GFR, Estimated 34 (*)    Anion gap 21 (*)    All other components within normal limits  CBC - Abnormal; Notable for the following components:   RBC 2.98 (*)    Hemoglobin 9.9 (*)    HCT 28.1 (*)    Platelets 46 (*)    nRBC 0.3 (*)    All other components within normal limits  CBC - Abnormal; Notable for the following components:   RBC 2.71 (*)    Hemoglobin 9.2 (*)    HCT 25.8 (*)    Platelets 46 (*)    nRBC 0.3 (*)    All other components within normal limits  CBC - Abnormal; Notable for the following components:   RBC 2.85 (*)    Hemoglobin 9.6 (*)    HCT 27.2 (*)    Platelets 52 (*)    All other components within normal limits  CBC - Abnormal;  Notable for the following components:   RBC 2.90 (*)    Hemoglobin 9.9 (*)    HCT 27.2 (*)    MCH 34.1 (*)    MCHC 36.4 (*)    Platelets 54 (*)    All other components within normal limits  CBC -  Abnormal; Notable for the following components:   WBC 11.5 (*)    RBC 2.86 (*)    Hemoglobin 9.3 (*)    HCT 26.6 (*)    Platelets 65 (*)    All other components within normal limits  COMPREHENSIVE METABOLIC PANEL WITH GFR - Abnormal; Notable for the following components:   Sodium 132 (*)    Potassium 3.2 (*)    Chloride 92 (*)    Glucose, Bld 111 (*)    BUN 25 (*)    Creatinine, Ser 1.25 (*)    Calcium 8.6 (*)    Total Protein 5.4 (*)    Albumin 2.7 (*)    AST 128 (*)    ALT 88 (*)    Total Bilirubin 4.6 (*)    Anion gap 17 (*)    All other components within normal limits  LIPASE, BLOOD - Abnormal; Notable for the following components:   Lipase 266 (*)    All other components within normal limits  MAGNESIUM  - Abnormal; Notable for the following components:   Magnesium  1.4 (*)    All other components within normal limits  CBC - Abnormal; Notable for the following components:   RBC 2.58 (*)    Hemoglobin 8.6 (*)    HCT 24.3 (*)    Platelets 95 (*)    All other components within normal limits  COMPREHENSIVE METABOLIC PANEL WITH GFR - Abnormal; Notable for the following components:   Sodium 133 (*)    Potassium 3.0 (*)    Chloride 96 (*)    Glucose, Bld 138 (*)    Calcium 8.4 (*)    Total Protein 5.0 (*)    Albumin 2.4 (*)    AST 82 (*)    ALT 65 (*)    Total Bilirubin 3.1 (*)    All other components within normal limits  PHOSPHORUS - Abnormal; Notable for the following components:   Phosphorus <1.0 (*)    All other components within normal limits  CBC - Abnormal; Notable for the following components:   RBC 2.88 (*)    Hemoglobin 9.7 (*)    HCT 27.1 (*)    Platelets 87 (*)    All other components within normal limits  POC OCCULT BLOOD, ED - Abnormal; Notable for the  following components:   Fecal Occult Bld POSITIVE (*)    All other components within normal limits  TROPONIN I (HIGH SENSITIVITY) - Abnormal; Notable for the following components:   Troponin I (High Sensitivity) 58 (*)    All other components within normal limits  TROPONIN I (HIGH SENSITIVITY) - Abnormal; Notable for the following components:   Troponin I (High Sensitivity) 57 (*)    All other components within normal limits  GASTROINTESTINAL PANEL BY PCR, STOOL (REPLACES STOOL CULTURE)  ETHANOL  PROTIME-INR  MAGNESIUM   VITAMIN B1  BASIC METABOLIC PANEL WITH GFR  PHOSPHORUS  TYPE AND SCREEN   ____________________________________________  EKG   EKG Interpretation Date/Time:  Friday Apr 18 2024 14:30:20 EDT Ventricular Rate:  115 PR Interval:  142 QRS Duration:  98 QT Interval:  367 QTC Calculation: 490 R Axis:   73  Text Interpretation: Sinus tachycardia with irregular rate Repol abnrm suggests ischemia, anterolateral new from prior 11/18 Confirmed by Racheal Buddle [phone]) on 04/19/2024 10:19:05 AM       ____________________________________________   PROCEDURES  Procedure(s) performed:   Procedures  CRITICAL CARE Performed by: Roberts Ching Total critical care time: 35 minutes Critical care  time was exclusive of separately billable procedures and treating other patients. Critical care was necessary to treat or prevent imminent or life-threatening deterioration. Critical care was time spent personally by me on the following activities: development of treatment plan with patient and/or surrogate as well as nursing, discussions with consultants, evaluation of patient's response to treatment, examination of patient, obtaining history from patient or surrogate, ordering and performing treatments and interventions, ordering and review of laboratory studies, ordering and review of radiographic studies, pulse oximetry and re-evaluation of patient's condition.  Abby Hocking,  MD Emergency Medicine  ____________________________________________   INITIAL IMPRESSION / ASSESSMENT AND PLAN / ED COURSE  Pertinent labs & imaging results that were available during my care of the patient were reviewed by me and considered in my medical decision making (see chart for details).   This patient is Presenting for Evaluation of AMS, which does require a range of treatment options, and is a complaint that involves a high risk of morbidity and mortality.  The Differential Diagnoses includes but is not exclusive to alcohol, illicit or prescription medications, intracranial pathology such as stroke, intracerebral hemorrhage, fever or infectious causes including sepsis, hypoxemia, uremia, trauma, endocrine related disorders such as diabetes, hypoglycemia, thyroid-related diseases, etc.   Critical Interventions-    Medications  allopurinol (ZYLOPRIM) tablet 100 mg (100 mg Oral Given 04/20/24 0936)  pantoprazole (PROTONIX) injection 40 mg (40 mg Intravenous Given 04/20/24 2044)  ondansetron  (ZOFRAN ) tablet 4 mg (has no administration in time range)    Or  ondansetron  (ZOFRAN ) injection 4 mg (has no administration in time range)  lidocaine  (LIDODERM ) 5 % 1 patch (1 patch Transdermal Patch Removed 04/20/24 2145)  thiamine (VITAMIN B1) tablet 100 mg ( Oral See Alternative 04/20/24 0942)    Or  thiamine (VITAMIN B1) injection 100 mg (100 mg Intravenous Given 04/20/24 0942)  folic acid (FOLVITE) tablet 1 mg (1 mg Oral Given 04/20/24 0936)  multivitamin with minerals tablet 1 tablet (1 tablet Oral Given 04/20/24 0936)  LORazepam (ATIVAN) injection 0-4 mg ( Intravenous Not Given 04/21/24 0231)    Followed by  LORazepam (ATIVAN) injection 0-4 mg (has no administration in time range)  lactated ringers  infusion ( Intravenous New Bag/Given 04/21/24 0414)  LORazepam (ATIVAN) injection 1-2 mg (2 mg Intravenous Given 04/20/24 2044)  loperamide (IMODIUM) capsule 2 mg (has no administration in time  range)  metoprolol tartrate (LOPRESSOR) injection 5 mg (5 mg Intravenous Not Given 04/21/24 0534)  acetaminophen  (OFIRMEV ) IV 1,000 mg (1,000 mg Intravenous New Bag/Given 04/21/24 0427)  potassium PHOSPHATE 30 mmol in dextrose  5 % 500 mL infusion (has no administration in time range)  magnesium  sulfate IVPB 2 g 50 mL (has no administration in time range)  potassium chloride 10 mEq in 100 mL IVPB (has no administration in time range)  sodium chloride  0.9 % bolus 500 mL (0 mLs Intravenous Stopped 04/18/24 1431)  sodium chloride  0.9 % bolus 1,000 mL (0 mLs Intravenous Stopped 04/18/24 1820)  sodium chloride  0.9 % bolus 1,000 mL (1,000 mLs Intravenous New Bag/Given 04/18/24 1856)  LORazepam (ATIVAN) injection 2 mg (2 mg Intravenous Given 04/19/24 1009)  potassium chloride 10 mEq in 100 mL IVPB (0 mEq Intravenous Stopped 04/20/24 1446)  magnesium  sulfate IVPB 4 g 100 mL (4 g Intravenous New Bag/Given 04/20/24 1007)  sodium chloride  0.9 % bolus 1,000 mL (1,000 mLs Intravenous New Bag/Given 04/21/24 0548)    Reassessment after intervention: symptoms unchanged.    I did obtain Additional Historical Information from ex-wife at  bedside.   I decided to review pertinent External Data, and in summary [facility] visit prior to ED arrival. Spoke with provider there by phone.    Clinical Laboratory Tests Ordered, included CMP shows acute kidney injury with creatinine of 2.44.  Bilirubin and LFTs also elevated along with lipase.  Troponin minimally elevated at 58, likely demand ischemia.  No severe anemia.  Hemoccult positive w/o melena.   Radiologic Tests Ordered, included CT head. I independently interpreted the images and agree with radiology interpretation.   Cardiac Monitor Tracing which shows NSR.    Social Determinants of Health Risk Patient with alcohol abuse history.   Medical Decision Making: Summary:  The patient presents emergency department with weakness, tremors, visual hallucinations.  Family has been  weaning back his alcohol at home over the past week although he has not completely stopped drinking he has cut back significantly.  Given the constellation of symptoms I do have some concern for possible developing DTs.  Plan to begin CIWA protocol along with fluids and thiamine.  Will obtain CT imaging of the head and labs.  Also describes some black stool.  Will need rectal exam to rule out bleed. CIWA 14 initially.   Reevaluation with update and discussion with patient and wife. Care transferred to oncoming team. Imaging pending. Patient on CIWA protocol. Anticipate admit.    Patient's presentation is most consistent with acute presentation with potential threat to life or bodily function.   Disposition: admit  ____________________________________________  FINAL CLINICAL IMPRESSION(S) / ED DIAGNOSES  Final diagnoses:  Somnolence  AKI (acute kidney injury) (HCC)  Alcohol abuse     Note:  This document was prepared using Dragon voice recognition software and may include unintentional dictation errors.  Abby Hocking, MD, [facility] Emergency Medicine    Morgaine Kimball, Shereen Dike, MD 04/21/24 [phone]

## 2024-04-18 NOTE — Progress Notes (Signed)
   04/18/24 1119  [facility] Triage Screening (Walk-ins at [facility] only)  How Did You Hear About Us ? Family/Friend  What Is the Reason for Your Visit/Call Today? Pt presents to [facility] accompanied by his wife. Pt reports that he is withdrawling pretty bad from alcohol. He mentions that he has been drinking on and off, essentially his whole life. Pt reports that he is shaking pretty bad and feelings like he has to drink more in order to stop the shaking. Pt denies drug use, Si, Hi and AVH. Pt is looking for detox at this time of triage. Pt also reports he is drinking every other day.  How Long Has This Been Causing You Problems? > than 6 months  Have You Recently Had Any Thoughts About Hurting Yourself? No  Are You Planning to Commit Suicide/Harm Yourself At This time? No  Have you Recently Had Thoughts About Hurting Someone Marigene Shoulder? No  Are You Planning To Harm Someone At This Time? No  Physical Abuse Denies  Verbal Abuse Denies  Sexual Abuse Denies  Exploitation of patient/patient's resources Denies  Self-Neglect Denies  Possible abuse reported to: Other (Comment)  Are you currently experiencing any auditory, visual or other hallucinations? No  Have You Used Any Alcohol or Drugs in the Past 24 Hours? Yes  What Did You Use and How Much? yesterday, few large beers  Do you have any current medical co-morbidities that require immediate attention? No  Clinician description of patient physical appearance/behavior: calm, cooperative  What Do You Feel Would Help You the Most Today? Alcohol or Drug Use Treatment  If access to [facility] Urgent Care was not available, would you have sought care in the Emergency Department? No  Determination of Need Urgent (48 hours)  Options For Referral Facility-Based Crisis  Determination of Need filed? Yes

## 2024-04-18 NOTE — H&P (Signed)
 History and Physical    Patient: Tony Lopez MVH:[id number] DOB: 1951-06-15 DOA: 04/18/2024 DOS: the patient was seen and examined on 04/18/2024 PCP: Diamond Formica, PA  Patient coming from: Home  Chief Complaint:  Chief Complaint  Patient presents with   Medical Clearance    Medical clearance for [facility]   HPI: Tony Lopez is a 73 y.o. 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  He has been drinking excessively to treat what was described as chronic back pain.  Patient has noted progressive decline.  He is here with his ex-wife who describes patient as trying to now quit drinking.  He has been tried to do alcohol detoxification at home.  Came in with altered mental status and weakness.  Also nausea vomiting abdominal pain.  In the last 7 days he has been unable to drink but previously was drinking heavily.  Patient also has been having hallucinations.  They have taken away his alcohol at home and he has been even more irritable.  Patient initially went to behavioral health center but transferred to [facility] due to concern for possible DTs.  At this point he was noted to have significant electrolyte abnormalities.  Also patient has increased lipase, markedly elevated LFTs as well as guaiac positive stools with anemia.  He is being admitted to the hospital with diagnosis of delirium tremens but also possible GI bleed.  His last drink was about 11:00 last night.  Review of Systems: As mentioned in the history of present illness. All other systems reviewed and are negative. Past Medical History:  Diagnosis Date   Arthritis    GERD (gastroesophageal reflux disease)    Hepatitis C    Hypertension    Prostate cancer (HCC)    Past Surgical History:  Procedure  Laterality Date   BACK SURGERY  1999   CYSTOSCOPY WITH INJECTION N/A 10/05/2017   Procedure: CYSTOSCOPY WITH INJECTION INDOCYANINE GREEN DYE;  Surgeon: Osborn Blaze, MD;  Location: [facility];  Service: Urology;  Laterality: N/A;   KNEE SURGERY Left early 90s   CARTILAGE CLEAN OUT    LYMPHADENECTOMY Bilateral 10/05/2017   Procedure: PELVIC LYMPHADENECTOMY;  Surgeon: Osborn Blaze, MD;  Location: [facility];  Service: Urology;  Laterality: Bilateral;   ROBOT ASSISTED LAPAROSCOPIC RADICAL PROSTATECTOMY N/A 10/05/2017   Procedure: XI ROBOTIC ASSISTED LAPAROSCOPIC RADICAL PROSTATECTOMY;  Surgeon: Osborn Blaze, MD;  Location: [facility];  Service: Urology;  Laterality: N/A;   Social History:  reports that he has been smoking cigarettes. He has a 35 pack-year smoking history. He has never used smokeless tobacco. He reports current alcohol use. He reports that he does not use drugs.  No Known Allergies  Family History  Problem Relation Age of Onset   Lung cancer Mother    Prostate cancer Neg Hx     Prior to Admission medications   Medication Sig Start Date End Date Taking? Authorizing Provider  acetaminophen  (TYLENOL ) 500 MG tablet Take 1 tablet (500 mg total) by mouth every 6 (six) hours as needed. Patient taking differently: Take 500 mg by mouth every 6 (six) hours as needed for mild pain (pain score 1-3). 08/21/20  Yes Avegno, Komlanvi S, FNP  allopurinol (ZYLOPRIM) 100 MG tablet Take 100 mg by mouth daily.   Yes [provider]  amLODipine (NORVASC) 5 MG  tablet Take 5 mg by mouth daily.   Yes [provider]  atorvastatin (LIPITOR) 10 MG tablet Take 10 mg by mouth daily.   Yes [provider]  lisinopril -hydrochlorothiazide  (ZESTORETIC ) 20-25 MG tablet Take 1 tablet by mouth daily.   Yes [provider]  omeprazole (PRILOSEC) 20 MG capsule Take 20 mg by mouth daily.   Yes [provider]  potassium chloride SA (KLOR-CON M) 20 MEQ tablet Take 20 mEq by  mouth daily.   Yes [provider]  allopurinol (ZYLOPRIM) 300 MG tablet Take 300 mg by mouth daily. Patient not taking: Reported on 04/18/2024    [provider]    Physical Exam: Vitals:   04/18/24 1754 04/18/24 1851 04/18/24 1857 04/18/24 2120  BP:  (!) 86/45 (!) 124/57 117/71  Pulse:  (!) 102 (!) 107 94  Resp:  17 16 18   Temp: 97.8 F (36.6 C)  97.8 F (36.6 C) 98.1 F (36.7 C)  TempSrc:      SpO2:  100% 100% 96%  Weight:      Height:       Constitutional: Altered mental status with agitation and chronically ill looking NAD, calm, comfortable Eyes: PERRL, lids and conjunctivae normal ENMT: Mucous membranes are moist. Posterior pharynx clear of any exudate or lesions.Normal dentition.  Neck: normal, supple, no masses, no thyromegaly Respiratory: clear to auscultation bilaterally, no wheezing, no crackles. Normal respiratory effort. No accessory muscle use.  Cardiovascular: Sinus tachycardia, no murmurs / rubs / gallops. No extremity edema. 2+ pedal pulses. No carotid bruits.  Abdomen: Distended, mild diffuse tenderness, no masses palpated. No hepatosplenomegaly. Bowel sounds positive.  Musculoskeletal: Good range of motion, no joint swelling or tenderness, Skin: no rashes, lesions, ulcers. No induration Neurologic: CN 2-12 grossly intact. Sensation intact, DTR normal. Strength 5/5 in all 4.  Psychiatric: Confused, hallucinating, mildly agitated  Data Reviewed:  Temperature 98.1, blood pressure 86/45, pulse 107 respirate of 16 oxygen sat 93% on room air.  Sodium is 128, chloride 85 CO2 of 17 glucose 127.  BUN 50 creatinine 2.44, anion gap of 26 alkaline phosphatase 144, albumin 3.3.  Lipase is 253 AST 254 ALT 139 total protein 6.1.  Total bilirubin is 6.0 white count 10.7 hemoglobin 11.1.  Tylenol  level less than 10 salicylate less than 10 glucose 127.  Alcohol level less than 15 salicylate level less than 7.  Fecal occult blood testing positive x 2  Assessment  and Plan:  #1 delirium tremens: Patient will be admitted.  CIWA protocol initiated.  Thiamine folic acid and counseling to continue.  Patient will likely need psych consultation and treatment after DT has resolved.  #2 alcoholic hepatitis: Patient has had history of hepatitis C.  Elevation of LFTs could be due to worsening hep C or alcoholism.  Will treat him for supportive care.  Avoid Tylenol  and any hepatotoxic drugs.  Follow LFTs.  GI consulted.  #3 guaiac positive stools: Possibly due to alcoholism.  Suspected liver disease as well.  Start IV Protonix and consult GI  #4 anemia: H&H stable.  Suspected GI bleed plus chronic disease.  Continue to monitor.  #5 hyponatremia: Most likely due to alcoholism.  Hydrate gently.  Follow sodium.  #6 AKI: Probably prerenal.  Hydrate and follow renal function.  #7 history of prostate cancer: Stable.  Continue to monitor  #8 abdominal pain with elevated lipase: Possible alcoholic pancreatitis.  Patient also has CT abdomen showing possible kidney lesion.  Will get abdominal ultrasound  Advance Care Planning:   Code Status: Full Code   Consults: Gastroenterology Dr. Brice Campi  Family Communication: Ex-wife at bedside  Severity of Illness: The appropriate patient status for this patient is INPATIENT. Inpatient status is judged to be reasonable and necessary in order to provide the required intensity of service to ensure the patient's safety. The patient's presenting symptoms, physical exam findings, and initial radiographic and laboratory data in the context of their chronic comorbidities is felt to place them at high risk for further clinical deterioration. Furthermore, it is not anticipated that the patient will be medically stable for discharge from the hospital within 2 midnights of admission.   * I certify that at the point of admission it is my clinical judgment that the patient will require inpatient hospital care spanning beyond 2 midnights  from the point of admission due to high intensity of service, high risk for further deterioration and high frequency of surveillance required.*  AuthorCarolin Chyle, MD 04/18/2024 9:33 PM  For on call review [email or web address].

## 2024-04-18 NOTE — ED Triage Notes (Signed)
 Pt BIBA for medical clearance for [facility] to detox. Pt sated that his legs started feeling weak since last week. Pt also c/o hallucinations and wife states that pt has not left his room since the last couple of months

## 2024-04-18 NOTE — ED Notes (Signed)
 Nt called CCMD to put pt on monitor

## 2024-04-19 ENCOUNTER — Inpatient Hospital Stay (HOSPITAL_COMMUNITY)

## 2024-04-19 DIAGNOSIS — F10931 Alcohol use, unspecified with withdrawal delirium: Secondary | ICD-10-CM | POA: Diagnosis not present

## 2024-04-19 LAB — COMPREHENSIVE METABOLIC PANEL WITH GFR
ALT: 103 U/L — ABNORMAL HIGH (ref 0–44)
AST: 178 U/L — ABNORMAL HIGH (ref 15–41)
Albumin: 2.7 g/dL — ABNORMAL LOW (ref 3.5–5.0)
Alkaline Phosphatase: 123 U/L (ref 38–126)
Anion gap: 21 — ABNORMAL HIGH (ref 5–15)
BUN: 50 mg/dL — ABNORMAL HIGH (ref 8–23)
CO2: 17 mmol/L — ABNORMAL LOW (ref 22–32)
Calcium: 8.3 mg/dL — ABNORMAL LOW (ref 8.9–10.3)
Chloride: 90 mmol/L — ABNORMAL LOW (ref 98–111)
Creatinine, Ser: 2.06 mg/dL — ABNORMAL HIGH (ref 0.61–1.24)
GFR, Estimated: 34 mL/min — ABNORMAL LOW (ref >60)
Glucose, Bld: 84 mg/dL (ref 70–99)
Potassium: 3.4 mmol/L — ABNORMAL LOW (ref 3.5–5.1)
Sodium: 128 mmol/L — ABNORMAL LOW (ref 135–145)
Total Bilirubin: 5.5 mg/dL — ABNORMAL HIGH (ref 0.0–1.2)
Total Protein: 5 g/dL — ABNORMAL LOW (ref 6.5–8.1)

## 2024-04-19 LAB — CBC
HCT: 25.8 % — ABNORMAL LOW (ref 39.0–52.0)
HCT: 27.2 % — ABNORMAL LOW (ref 39.0–52.0)
HCT: 27.2 % — ABNORMAL LOW (ref 39.0–52.0)
Hemoglobin: 9.2 g/dL — ABNORMAL LOW (ref 13.0–17.0)
Hemoglobin: 9.6 g/dL — ABNORMAL LOW (ref 13.0–17.0)
Hemoglobin: 9.9 g/dL — ABNORMAL LOW (ref 13.0–17.0)
MCH: 33.9 pg (ref 26.0–34.0)
MCH: 33.7 pg (ref 26.0–34.0)
MCH: 34.1 pg — ABNORMAL HIGH (ref 26.0–34.0)
MCHC: 35.7 g/dL (ref 30.0–36.0)
MCHC: 35.3 g/dL (ref 30.0–36.0)
MCHC: 36.4 g/dL — ABNORMAL HIGH (ref 30.0–36.0)
MCV: 95.2 fL (ref 80.0–100.0)
MCV: 95.4 fL (ref 80.0–100.0)
MCV: 93.8 fL (ref 80.0–100.0)
Platelets: 46 10*3/uL — ABNORMAL LOW (ref 150–400)
Platelets: 52 10*3/uL — ABNORMAL LOW (ref 150–400)
Platelets: 54 10*3/uL — ABNORMAL LOW (ref 150–400)
RBC: 2.71 MIL/uL — ABNORMAL LOW (ref 4.22–5.81)
RBC: 2.85 MIL/uL — ABNORMAL LOW (ref 4.22–5.81)
RBC: 2.9 MIL/uL — ABNORMAL LOW (ref 4.22–5.81)
RDW: 12.9 % (ref 11.5–15.5)
RDW: 13 % (ref 11.5–15.5)
RDW: 12.8 % (ref 11.5–15.5)
WBC: 7.3 10*3/uL (ref 4.0–10.5)
WBC: 7.5 10*3/uL (ref 4.0–10.5)
WBC: 9.3 10*3/uL (ref 4.0–10.5)
nRBC: 0.3 % — ABNORMAL HIGH (ref 0.0–0.2)
nRBC: 0 % (ref 0.0–0.2)
nRBC: 0 % (ref 0.0–0.2)

## 2024-04-19 MED ORDER — ADULT MULTIVITAMIN W/MINERALS CH
1.0000 | ORAL_TABLET | Freq: Every day | ORAL | Status: DC
  Administered 2024-04-20 – 2024-05-02 (×9): 1 via ORAL
  Filled 2024-04-19 (×9): qty 1

## 2024-04-19 MED ORDER — THIAMINE MONONITRATE 100 MG PO TABS
100.0000 mg | ORAL_TABLET | Freq: Every day | ORAL | Status: DC
  Administered 2024-04-24 – 2024-05-02 (×8): 100 mg via ORAL
  Filled 2024-04-19 (×8): qty 1

## 2024-04-19 MED ORDER — THIAMINE HCL 100 MG/ML IJ SOLN
100.0000 mg | Freq: Every day | INTRAMUSCULAR | Status: DC
  Administered 2024-04-19 – 2024-04-25 (×4): 100 mg via INTRAVENOUS
  Filled 2024-04-19 (×5): qty 2

## 2024-04-19 MED ORDER — LORAZEPAM 2 MG/ML IJ SOLN
0.0000 mg | Freq: Three times a day (TID) | INTRAMUSCULAR | Status: AC
  Administered 2024-04-22 – 2024-04-23 (×3): 2 mg via INTRAVENOUS
  Filled 2024-04-19 (×3): qty 1

## 2024-04-19 MED ORDER — LORAZEPAM 2 MG/ML IJ SOLN
0.0000 mg | INTRAMUSCULAR | Status: AC
  Administered 2024-04-19: 3 mg via INTRAVENOUS
  Administered 2024-04-19 – 2024-04-20 (×2): 2 mg via INTRAVENOUS
  Filled 2024-04-19: qty 2
  Filled 2024-04-19 (×4): qty 1

## 2024-04-19 MED ORDER — FOLIC ACID 1 MG PO TABS
1.0000 mg | ORAL_TABLET | Freq: Every day | ORAL | Status: DC
  Administered 2024-04-20 – 2024-05-02 (×9): 1 mg via ORAL
  Filled 2024-04-19 (×9): qty 1

## 2024-04-19 MED ORDER — LACTATED RINGERS IV SOLN: INTRAVENOUS | Status: DC

## 2024-04-19 MED ORDER — LORAZEPAM 2 MG/ML IJ SOLN
1.0000 mg | INTRAMUSCULAR | Status: DC | PRN
  Administered 2024-04-20 (×2): 2 mg via INTRAVENOUS
  Administered 2024-04-22: 1 mg via INTRAVENOUS
  Administered 2024-04-25: 2 mg via INTRAVENOUS
  Administered 2024-04-25: 1 mg via INTRAVENOUS
  Filled 2024-04-19 (×4): qty 1

## 2024-04-19 MED ORDER — LORAZEPAM 2 MG/ML IJ SOLN
2.0000 mg | Freq: Once | INTRAMUSCULAR | Status: AC
  Administered 2024-04-19: 2 mg via INTRAVENOUS
  Filled 2024-04-19: qty 1

## 2024-04-19 MED ORDER — LIDOCAINE 5 % EX PTCH
1.0000 | MEDICATED_PATCH | CUTANEOUS | Status: DC
  Administered 2024-04-20 – 2024-05-02 (×12): 1 via TRANSDERMAL
  Filled 2024-04-19 (×12): qty 1

## 2024-04-19 NOTE — Progress Notes (Signed)
 PROGRESS NOTE    Tony Lopez  ZOX:[id number] DOB: 1951/10/20 DOA: 04/18/2024 PCP: Diamond Formica, PA    Hospital course:  73 yo M with HTN and ongoing EtOH use, h/o hepatitis C and prostate cancer was admitted for alcohol detoxification.  He had stopped drinking alcohol 7 days previously and was noted to have altered mental status with nausea and abdominal pain.  Patient was already in DTs when he was admitted.  Laboratory data were noted for hyponatremia, elevated LFTs, elevated lipase, AKI and guaiac positive stools.  He was started on hydration and CIWA protocol.  Patient was seen by gastroenterology who noted no indication for steroids for likely alcoholic hepatitis given discriminant function less than 10 no indication for endoscopy was warranted at this time.  Subjective:  Patient states that he is "trying to get these buttons off me".  He was picking at EKG leads.  Patient admits he is confused.  Not exactly sure where he he is.  Exam:  General: Confused man lying in bed with mitts on pulling EKG leads. Eyes: sclera anicteric, conjuctiva mild injection bilaterally CVS: S1-S2, regular  Respiratory:  decreased air entry bilaterally secondary to decreased inspiratory effort, rales at bases  GI: NABS, soft, NT  LE: Warm and well-perfused  Assessment & Plan:   Acute delirium tremens Patient attempted to stop drinking 7 days PTA, admitted already in acute DTs Patient started on MedSurg CIWA protocol with q 6-hour lorazepam  Patient remained agitated and he was increased to the stepdown CIWA protocol with some improvement.  I have had to give him extra doses of Ativan per RN request Would have low threshold for referral to ICU if continues agitated  Irregularly irregular narrow complex tachycardia EKG from yesterday and today shows narrow complex tachycardia with P waves Reviewed with Dr. Audery Blazing in cardiology who agrees this is likely sinus tachycardia with PACs.   Patient's BP is low normal 110s/60s. Will try to control hyperadrenergic state with Ativan Will not start beta-blocker right now given lowish BP  Hyponatremia Patient has been having nausea and vomiting with decreased p.o. intake x 7 days Patient received 2 L NS bolus in ED Continue LR 125 an hour Repeat sodium tomorrow  AKI Improved but not normalized with hydration Continue hydration, follow creatinine CT abdomen shows 2x2 left renal cyst with Hounsfield unit of 26-32, thought to be proteinaceous/hemorrhagic cyst.  Pancreatitis Likely secondary to alcohol Treat patient conservatively with IVF and bowel rest CT abdomen shows "unremarkable" pancreas with no inflammatory changes  Alcoholic hepatitis Elevated LFTs improving but not normalized overnight Treated with alcohol cessation and hydration CT abdomen shows normal-sized liver that is not cirrhotic but he does have diffuse hepatic steatosis.  Liver US  does show biliary sludge and small gallstones and GB, no cholecystitis.  Guaiac positive stool H&H have been stable since admission Seen by GI, no further inpatient workup required Continue IV PPI   DVT prophylaxis: SCD Code Status: Full Family Communication: None today, ex-wife was updated yesterday, she accompanied patient to ED Disposition: Likely home but TBD       Diet Orders (From admission, onward)     Start     Ordered   04/18/24 2024  Diet clear liquid Room service appropriate? Yes; Fluid consistency: Thin  Diet effective now       Question Answer Comment  Room service appropriate? Yes   Fluid consistency: Thin      04/18/24 2025  Objective: Vitals:   04/19/24 1338 04/19/24 1419 04/19/24 1512 04/19/24 1700  BP: 111/70 109/71 121/72 125/66  Pulse: 97 (!) 106 95 95  Resp:  (!) 29 (!) 25 17  Temp:      TempSrc:      SpO2:  97% 96% 97%  Weight:      Height:        Intake/Output Summary (Last 24 hours) at 04/19/2024 1724 Last data  filed at 04/19/2024 1145 Gross per 24 hour  Intake --  Output 700 ml  Net -700 ml   Filed Weights   04/18/24 1322  Weight: 93 kg    Scheduled Meds:  allopurinol  100 mg Oral Daily   folic acid  1 mg Oral Daily   lidocaine   1 patch Transdermal Q24H   LORazepam  0-4 mg Intravenous Q4H   Followed by   Cecily Cohen ON 04/21/2024] LORazepam  0-4 mg Intravenous Q8H   multivitamin with minerals  1 tablet Oral Daily   pantoprazole (PROTONIX) IV  40 mg Intravenous Q12H   thiamine  100 mg Oral Daily   Or   thiamine  100 mg Intravenous Daily   Continuous Infusions:  lactated ringers  125 mL/hr at 04/19/24 1336    Nutritional status     Body mass index is 27.81 kg/m.  Data Reviewed:   CBC: Recent Labs  Lab 04/18/24 1354 04/18/24 2128 04/19/24 0217 04/19/24 0815 04/19/24 1426  WBC 10.7* 7.6 7.3 7.5 9.3  NEUTROABS 9.1*  --   --   --   --   HGB 11.1* 9.9* 9.2* 9.6* 9.9*  HCT 31.3* 28.1* 25.8* 27.2* 27.2*  MCV 94.8 94.3 95.2 95.4 93.8  PLT 66* 46* 46* 52* 54*   Basic Metabolic Panel: Recent Labs  Lab 04/18/24 1354 04/19/24 0217  NA 128* 128*  K 4.1 3.4*  CL 85* 90*  CO2 17* 17*  GLUCOSE 127* 84  BUN 50* 50*  CREATININE 2.44* 2.06*  CALCIUM 8.9 8.3*   GFR: Estimated Creatinine Clearance: 36.1 mL/min (A) (by C-G formula based on SCr of 2.06 mg/dL (H)). Liver Function Tests: Recent Labs  Lab 04/18/24 1354 04/19/24 0217  AST 254* 178*  ALT 139* 103*  ALKPHOS 144* 123  BILITOT 6.0* 5.5*  PROT 6.1* 5.0*  ALBUMIN 3.3* 2.7*   Recent Labs  Lab 04/18/24 1354  LIPASE 253*   No results for input(s): "AMMONIA" in the last 168 hours. Coagulation Profile: Recent Labs  Lab 04/18/24 1437  INR 0.9   Cardiac Enzymes: No results for input(s): "CKTOTAL", "CKMB", "CKMBINDEX", "TROPONINI" in the last 168 hours. BNP (last 3 results) No results for input(s): "PROBNP" in the last 8760 hours. HbA1C: No results for input(s): "HGBA1C" in the last 72 hours. CBG: No results  for input(s): "GLUCAP" in the last 168 hours. Lipid Profile: No results for input(s): "CHOL", "HDL", "LDLCALC", "TRIG", "CHOLHDL", "LDLDIRECT" in the last 72 hours. Thyroid Function Tests: No results for input(s): "TSH", "T4TOTAL", "FREET4", "T3FREE", "THYROIDAB" in the last 72 hours. Anemia Panel: No results for input(s): "VITAMINB12", "FOLATE", "FERRITIN", "TIBC", "IRON", "RETICCTPCT" in the last 72 hours. Sepsis Labs: No results for input(s): "PROCALCITON", "LATICACIDVEN" in the last 168 hours.  No results found for this or any previous visit (from the past 240 hours).       Radiology Studies: US  Abdomen Limited RUQ (LIVER/GB) Result Date: 04/19/2024 CLINICAL DATA:  Abnormal liver function tests, alcohol abuse, hepatitis C. EXAM: ULTRASOUND ABDOMEN LIMITED RIGHT UPPER QUADRANT COMPARISON:  CT of  the abdomen on 04/18/2024 FINDINGS: Gallbladder: There is some biliary sludge in the gallbladder as well as some probable small gallstones. No evidence of gallbladder wall thickening or sonographic Murphy's sign. Common bile duct: Diameter: 6-7 mm Liver: The liver demonstrates coarse echotexture and increased echogenicity, likely reflecting diffuse steatosis. No overt cirrhotic contour abnormalities or focal lesions are identified. There is no evidence of intrahepatic biliary ductal dilatation. Portal vein is patent on color Doppler imaging with normal direction of blood flow towards the liver. Other: No ascites visualized in the right upper quadrant. IMPRESSION: 1. Biliary sludge and probable small gallstones in the gallbladder. No evidence of acute cholecystitis. 2. Diffuse hepatic steatosis. Electronically Signed   By: Erica Hau M.D.   On: 04/19/2024 09:34   CT ABDOMEN PELVIS WO CONTRAST Result Date: 04/18/2024 CLINICAL DATA:  Abdominal pain, acute, nonlocalized. History of prostate carcinoma. * Tracking Code: BO * EXAM: CT ABDOMEN AND PELVIS WITHOUT CONTRAST TECHNIQUE: Multidetector CT  imaging of the abdomen and pelvis was performed following the standard protocol without IV contrast. RADIATION DOSE REDUCTION: This exam was performed according to the departmental dose-optimization program which includes automated exposure control, adjustment of the mA and/or kV according to patient size and/or use of iterative reconstruction technique. COMPARISON:  None Available. FINDINGS: Lower chest: The lung bases are clear. No pleural effusion. The heart is normal in size. No pericardial effusion. Partially seen lipomatous hypertrophy of interatrial septum. Hepatobiliary: The liver is normal in size. Non-cirrhotic configuration. No suspicious mass. There is marked diffuse hepatic steatosis. No intrahepatic or extrahepatic bile duct dilation. Small volume subcentimeter sized calcified gallstones without imaging signs of acute cholecystitis. Normal gallbladder wall thickness. No pericholecystic inflammatory changes. Pancreas: Unremarkable. No pancreatic ductal dilatation or surrounding inflammatory changes. Spleen: Within normal limits. No focal lesion. Adrenals/Urinary Tract: Adrenal glands are unremarkable. There is a partially exophytic approximately 1.6 x 2.4 cm mildly hypoattenuating structure (in relation to the renal cortex) arising from the left kidney interpolar region, laterally, with internal CT attenuation of 26-32 Hounsfield units. This cannot be characterized as a simple cyst. This is incompletely characterized on the current exam but favored to represent a proteinaceous/hemorrhagic cyst. Comparison can be made if prior imaging is available. Otherwise, consider further evaluation with ultrasound. No nephroureterolithiasis or obstructive uropathy on either side. Unremarkable urinary bladder. Stomach/Bowel: No disproportionate dilation of the small or large bowel loops. No evidence of abnormal bowel wall thickening or inflammatory changes. The appendix is unremarkable. There are scattered  diverticula mainly in the left hemi colon, without imaging signs of diverticulitis. Vascular/Lymphatic: No ascites or pneumoperitoneum. No abdominal or pelvic lymphadenopathy, by size criteria. No aneurysmal dilation of the major abdominal arteries. There are moderate peripheral atherosclerotic vascular calcifications of the aorta and its major branches. Reproductive: Patient is status post surgical removal of prostate and bilateral seminal vesicles. Other: There is a tiny fat containing umbilical hernia. The soft tissues and abdominal wall are otherwise unremarkable. Musculoskeletal: No suspicious osseous lesions. There are mild multilevel degenerative changes in the visualized spine. IMPRESSION: 1. No acute inflammatory process identified within the abdomen or pelvis. No nephroureterolithiasis or obstructive uropathy on either side. 2. There is a partially exophytic approximately 1.6 x 2.4 cm mildly hypoattenuating structure arising from the left kidney interpolar region, laterally, with internal CT attenuation of 26-32 Hounsfield units. This cannot be characterized as a simple cyst. This is incompletely characterized on the current exam but favored to represent a proteinaceous/hemorrhagic cyst. Comparison can be made if prior imaging  is available. Otherwise, consider further evaluation with ultrasound. 3. No metastatic prostate carcinoma seen in the abdomen or pelvis. 4. Multiple other nonacute observations, as described above. Aortic Atherosclerosis (ICD10-I70.0). Electronically Signed   By: Beula Brunswick M.D.   On: 04/18/2024 16:27   CT Head Wo Contrast Result Date: 04/18/2024 CLINICAL DATA:  Mental status change, unknown cause EXAM: CT HEAD WITHOUT CONTRAST TECHNIQUE: Contiguous axial images were obtained from the base of the skull through the vertex without intravenous contrast. RADIATION DOSE REDUCTION: This exam was performed according to the departmental dose-optimization program which includes  automated exposure control, adjustment of the mA and/or kV according to patient size and/or use of iterative reconstruction technique. COMPARISON:  CT head March 14, 2007. FINDINGS: Brain: No evidence of acute infarction, hemorrhage, hydrocephalus, extra-axial collection or mass lesion/mass effect. Small remote right cerebellar infarct. Vascular: No hyperdense vessel.  Calcific atherosclerosis. Skull: No acute fracture. Sinuses/Orbits: Clear sinuses.  No acute orbital findings. Other: No mastoid effusions. IMPRESSION: No evidence of acute intracranial abnormality. Electronically Signed   By: Stevenson Elbe M.D.   On: 04/18/2024 15:37           LOS: 1 day   Time spent= 35 mins    Magdalene School, MD [facility]  If 7PM-7AM, please contact night-coverage  04/19/2024, 5:24 PM

## 2024-04-19 NOTE — Consult Note (Signed)
 [facility] Gastroenterology Consultation Note  Referring Provider: No ref. provider found Primary Care Physician:  Diamond Formica, PA Primary Gastroenterologist:  Dr. Veronda Goody  Reason for Consultation:  anemia, hemoccult-positive stool  HPI: Tony Lopez is a 73 y.o. male we're asked to see for anemia and hemoccult positive stools.  Patient in alcohol withdrawal and no history is possible.  No reported hematemesis or melena or hematochezia.   Past Medical History:  Diagnosis Date   Arthritis    GERD (gastroesophageal reflux disease)    Hepatitis C    Hypertension    Prostate cancer (HCC)     Past Surgical History:  Procedure Laterality Date   BACK SURGERY  1999   CYSTOSCOPY WITH INJECTION N/A 10/05/2017   Procedure: CYSTOSCOPY WITH INJECTION INDOCYANINE GREEN DYE;  Surgeon: Osborn Blaze, MD;  Location: [facility];  Service: Urology;  Laterality: N/A;   KNEE SURGERY Left early 90s   CARTILAGE CLEAN OUT    LYMPHADENECTOMY Bilateral 10/05/2017   Procedure: PELVIC LYMPHADENECTOMY;  Surgeon: Osborn Blaze, MD;  Location: [facility];  Service: Urology;  Laterality: Bilateral;   ROBOT ASSISTED LAPAROSCOPIC RADICAL PROSTATECTOMY N/A 10/05/2017   Procedure: XI ROBOTIC ASSISTED LAPAROSCOPIC RADICAL PROSTATECTOMY;  Surgeon: Osborn Blaze, MD;  Location: [facility];  Service: Urology;  Laterality: N/A;    Prior to Admission medications   Medication Sig Start Date End Date Taking? Authorizing Provider  acetaminophen  (TYLENOL ) 500 MG tablet Take 1 tablet (500 mg total) by mouth every 6 (six) hours as needed. Patient taking differently: Take 500 mg by mouth every 6 (six) hours as needed for mild pain (pain score 1-3). 08/21/20  Yes Avegno, Komlanvi S, FNP  allopurinol (ZYLOPRIM) 100 MG tablet Take 100 mg by mouth daily.   Yes [provider]  amLODipine (NORVASC) 5 MG tablet Take 5 mg by mouth daily.   Yes [provider]  atorvastatin (LIPITOR) 10 MG tablet Take 10 mg by mouth  daily.   Yes [provider]  lisinopril -hydrochlorothiazide  (ZESTORETIC ) 20-25 MG tablet Take 1 tablet by mouth daily.   Yes [provider]  omeprazole (PRILOSEC) 20 MG capsule Take 20 mg by mouth daily.   Yes [provider]  potassium chloride SA (KLOR-CON M) 20 MEQ tablet Take 20 mEq by mouth daily.   Yes [provider]  allopurinol (ZYLOPRIM) 300 MG tablet Take 300 mg by mouth daily. Patient not taking: Reported on 04/18/2024    [provider]    Current Facility-Administered Medications  Medication Dose Route Frequency Provider Last Rate Last Admin   allopurinol (ZYLOPRIM) tablet 100 mg  100 mg Oral Daily Garba, Mohammad L, MD   100 mg at 04/18/24 2231   folic acid (FOLVITE) tablet 1 mg  1 mg Oral Daily Chatterjee, Srobona Tublu, MD       lidocaine  (LIDODERM ) 5 % 1 patch  1 patch Transdermal Q24H Chatterjee, Srobona Tublu, MD       LORazepam (ATIVAN) injection 0-4 mg  0-4 mg Intravenous Q4H Chatterjee, Srobona Tublu, MD       Followed by   Cecily Cohen ON 04/21/2024] LORazepam (ATIVAN) injection 0-4 mg  0-4 mg Intravenous Q8H Chatterjee, Srobona Tublu, MD       multivitamin with minerals tablet 1 tablet  1 tablet Oral Daily Chatterjee, Srobona Tublu, MD       ondansetron  (ZOFRAN ) tablet 4 mg  4 mg Oral Q6H PRN [name], MD       Or   ondansetron  (ZOFRAN ) injection  4 mg  4 mg Intravenous Q6H PRN Garba, Mohammad L, MD       pantoprazole (PROTONIX) injection 40 mg  40 mg Intravenous Q12H Sudie Ely L, MD   40 mg at 04/18/24 2231   thiamine (VITAMIN B1) tablet 100 mg  100 mg Oral Daily Chatterjee, Srobona Tublu, MD       Or   thiamine (VITAMIN B1) injection 100 mg  100 mg Intravenous Daily Chatterjee, Srobona Tublu, MD        Allergies as of 04/18/2024   (No Known Allergies)    Family History  Problem Relation Age of Onset   Lung cancer Mother    Prostate cancer Neg Hx     Social History   Socioeconomic History   Marital  status: Legally Separated    Spouse name: Not on file   Number of children: Not on file   Years of education: Not on file   Highest education level: Not on file  Occupational History   Not on file  Tobacco Use   Smoking status: Every Day    Current packs/day: 1.00    Average packs/day: 1 pack/day for 35.0 years (35.0 ttl pk-yrs)    Types: Cigarettes   Smokeless tobacco: Never  Substance and Sexual Activity   Alcohol use: Yes    Comment: SELDOM    Drug use: No   Sexual activity: Not on file  Other Topics Concern   Not on file  Social History Narrative   Not on file   Social Drivers of Health   Financial Resource Strain: Not on file  Food Insecurity: No Food Insecurity (04/19/2024)   Hunger Vital Sign    Worried About Running Out of Food in the Last Year: Never true    Ran Out of Food in the Last Year: Never true  Transportation Needs: No Transportation Needs (04/19/2024)   PRAPARE - Administrator, Civil Service (Medical): No    Lack of Transportation (Non-Medical): No  Physical Activity: Not on file  Stress: Not on file  Social Connections: Not on file  Intimate Partner Violence: Not At Risk (04/19/2024)   Humiliation, Afraid, Rape, and Kick questionnaire    Fear of Current or Ex-Partner: No    Emotionally Abused: No    Physically Abused: No    Sexually Abused: No    Review of Systems:Unable to obtain due to altered mental status  Physical Exam: Vital signs in last 24 hours: Temp:  [97.7 F (36.5 C)-98.6 F (37 C)] 97.7 F (36.5 C) (05/24 1100) Pulse Rate:  [81-129] 81 (05/24 1100) Resp:  [16-18] 18 (05/24 0508) BP: (86-132)/(45-118) 107/68 (05/24 1100) SpO2:  [93 %-100 %] 100 % (05/24 1100) Weight:  [93 kg] 93 kg (05/23 1322) Last BM Date : 04/18/24 General:  Somnolent, not interactive Head:  Normocephalic and atraumatic. Eyes:  Sclera clear, no icterus.   Conjunctiva pink. Ears:  Normal auditory acuity. Nose:  No deformity, discharge,  or  lesions. Mouth:  No deformity or lesions.  Oropharynx pink & moist. Neck:  Supple; no masses or thyromegaly. Lungs:  No respiratory distress Abdomen:  Soft, nontender and nondistended. No masses, hepatosplenomegaly or hernias noted. Normal bowel sounds, without guarding, and without rebound.     Msk:  Symmetrical without gross deformities. Normal posture. Pulses:  Normal pulses noted. Extremities:  Without clubbing or edema. Neurologic:  Somnolent, not interactive Skin:  Intact without significant lesions or rashes. Psych:  Alert and cooperative. Normal mood and  affect.   Lab Results: Recent Labs    04/18/24 2128 04/19/24 0217 04/19/24 0815  WBC 7.6 7.3 7.5  HGB 9.9* 9.2* 9.6*  HCT 28.1* 25.8* 27.2*  PLT 46* 46* 52*   BMET Recent Labs    04/18/24 1354 04/19/24 0217  NA 128* 128*  K 4.1 3.4*  CL 85* 90*  CO2 17* 17*  GLUCOSE 127* 84  BUN 50* 50*  CREATININE 2.44* 2.06*  CALCIUM 8.9 8.3*   LFT Recent Labs    04/19/24 0217  PROT 5.0*  ALBUMIN 2.7*  AST 178*  ALT 103*  ALKPHOS 123  BILITOT 5.5*   PT/INR Recent Labs    04/18/24 1437  LABPROT 12.6  INR 0.9    Studies/Results: US  Abdomen Limited RUQ (LIVER/GB) Result Date: 04/19/2024 CLINICAL DATA:  Abnormal liver function tests, alcohol abuse, hepatitis C. EXAM: ULTRASOUND ABDOMEN LIMITED RIGHT UPPER QUADRANT COMPARISON:  CT of the abdomen on 04/18/2024 FINDINGS: Gallbladder: There is some biliary sludge in the gallbladder as well as some probable small gallstones. No evidence of gallbladder wall thickening or sonographic Murphy's sign. Common bile duct: Diameter: 6-7 mm Liver: The liver demonstrates coarse echotexture and increased echogenicity, likely reflecting diffuse steatosis. No overt cirrhotic contour abnormalities or focal lesions are identified. There is no evidence of intrahepatic biliary ductal dilatation. Portal vein is patent on color Doppler imaging with normal direction of blood flow towards the  liver. Other: No ascites visualized in the right upper quadrant. IMPRESSION: 1. Biliary sludge and probable small gallstones in the gallbladder. No evidence of acute cholecystitis. 2. Diffuse hepatic steatosis. Electronically Signed   By: Erica Hau M.D.   On: 04/19/2024 09:34   CT ABDOMEN PELVIS WO CONTRAST Result Date: 04/18/2024 CLINICAL DATA:  Abdominal pain, acute, nonlocalized. History of prostate carcinoma. * Tracking Code: BO * EXAM: CT ABDOMEN AND PELVIS WITHOUT CONTRAST TECHNIQUE: Multidetector CT imaging of the abdomen and pelvis was performed following the standard protocol without IV contrast. RADIATION DOSE REDUCTION: This exam was performed according to the departmental dose-optimization program which includes automated exposure control, adjustment of the mA and/or kV according to patient size and/or use of iterative reconstruction technique. COMPARISON:  None Available. FINDINGS: Lower chest: The lung bases are clear. No pleural effusion. The heart is normal in size. No pericardial effusion. Partially seen lipomatous hypertrophy of interatrial septum. Hepatobiliary: The liver is normal in size. Non-cirrhotic configuration. No suspicious mass. There is marked diffuse hepatic steatosis. No intrahepatic or extrahepatic bile duct dilation. Small volume subcentimeter sized calcified gallstones without imaging signs of acute cholecystitis. Normal gallbladder wall thickness. No pericholecystic inflammatory changes. Pancreas: Unremarkable. No pancreatic ductal dilatation or surrounding inflammatory changes. Spleen: Within normal limits. No focal lesion. Adrenals/Urinary Tract: Adrenal glands are unremarkable. There is a partially exophytic approximately 1.6 x 2.4 cm mildly hypoattenuating structure (in relation to the renal cortex) arising from the left kidney interpolar region, laterally, with internal CT attenuation of 26-32 Hounsfield units. This cannot be characterized as a simple cyst. This is  incompletely characterized on the current exam but favored to represent a proteinaceous/hemorrhagic cyst. Comparison can be made if prior imaging is available. Otherwise, consider further evaluation with ultrasound. No nephroureterolithiasis or obstructive uropathy on either side. Unremarkable urinary bladder. Stomach/Bowel: No disproportionate dilation of the small or large bowel loops. No evidence of abnormal bowel wall thickening or inflammatory changes. The appendix is unremarkable. There are scattered diverticula mainly in the left hemi colon, without imaging signs of diverticulitis. Vascular/Lymphatic: No ascites  or pneumoperitoneum. No abdominal or pelvic lymphadenopathy, by size criteria. No aneurysmal dilation of the major abdominal arteries. There are moderate peripheral atherosclerotic vascular calcifications of the aorta and its major branches. Reproductive: Patient is status post surgical removal of prostate and bilateral seminal vesicles. Other: There is a tiny fat containing umbilical hernia. The soft tissues and abdominal wall are otherwise unremarkable. Musculoskeletal: No suspicious osseous lesions. There are mild multilevel degenerative changes in the visualized spine. IMPRESSION: 1. No acute inflammatory process identified within the abdomen or pelvis. No nephroureterolithiasis or obstructive uropathy on either side. 2. There is a partially exophytic approximately 1.6 x 2.4 cm mildly hypoattenuating structure arising from the left kidney interpolar region, laterally, with internal CT attenuation of 26-32 Hounsfield units. This cannot be characterized as a simple cyst. This is incompletely characterized on the current exam but favored to represent a proteinaceous/hemorrhagic cyst. Comparison can be made if prior imaging is available. Otherwise, consider further evaluation with ultrasound. 3. No metastatic prostate carcinoma seen in the abdomen or pelvis. 4. Multiple other nonacute observations, as  described above. Aortic Atherosclerosis (ICD10-I70.0). Electronically Signed   By: Beula Brunswick M.D.   On: 04/18/2024 16:27   CT Head Wo Contrast Result Date: 04/18/2024 CLINICAL DATA:  Mental status change, unknown cause EXAM: CT HEAD WITHOUT CONTRAST TECHNIQUE: Contiguous axial images were obtained from the base of the skull through the vertex without intravenous contrast. RADIATION DOSE REDUCTION: This exam was performed according to the departmental dose-optimization program which includes automated exposure control, adjustment of the mA and/or kV according to patient size and/or use of iterative reconstruction technique. COMPARISON:  CT head March 14, 2007. FINDINGS: Brain: No evidence of acute infarction, hemorrhage, hydrocephalus, extra-axial collection or mass lesion/mass effect. Small remote right cerebellar infarct. Vascular: No hyperdense vessel.  Calcific atherosclerosis. Skull: No acute fracture. Sinuses/Orbits: Clear sinuses.  No acute orbital findings. Other: No mastoid effusions. IMPRESSION: No evidence of acute intracranial abnormality. Electronically Signed   By: Stevenson Elbe M.D.   On: 04/18/2024 15:37    Impression:   Alcohol abuse. Alcohol withdrawal.  Patient currently unable to give history. Anemia with hemoccult-positive stools.  No overt GI bleeding.  Prior endoscopy/colonoscopy couple years ago. Anemia and thrombocytopenia suspect alcohol suppression bone marrow.  No splenomegaly or other findings of portal hypertension on imaging.  Plan:   Treatment alcohol withdrawal. There is absolutely no role for endoscopic evaluation in present setting. Follow LFTs supportively.  Discriminant function < 10 thus wouldn't treat steroids. [facility] will sign-off; hospitalist team can arrange GI outpatient follow-up with Dr. Veronda Goody upon discharge.   LOS: 1 day   Valda Christenson M  04/19/2024, 12:08 PM  Cell [phone] If no answer or after 5 PM call [phone]

## 2024-04-19 NOTE — Plan of Care (Signed)

## 2024-04-19 NOTE — Hospital Course (Signed)
 Hospital course:  73 yo M with HTN and ongoing EtOH use, h/o hepatitis C and prostate cancer was admitted for alcohol detoxification.  He had stopped drinking alcohol 7 days previously and was noted to have altered mental status with nausea and abdominal pain.  Patient was already in DTs when he was admitted.  Laboratory data were noted for hyponatremia, elevated LFTs, elevated lipase, AKI and guaiac positive stools.  He was started on hydration and CIWA protocol.  Patient was seen by gastroenterology who noted no indication for steroids for likely alcoholic hepatitis given discriminant function less than 10 no indication for endoscopy was warranted at this time.  Subjective:  Patient states that he is "trying to get these buttons off me".  He was picking at EKG leads.  Patient admits he is confused.  Not exactly sure where he he is.  Exam:  General: Confused man lying in bed with mitts on pulling EKG leads. Eyes: sclera anicteric, conjuctiva mild injection bilaterally CVS: S1-S2, regular  Respiratory:  decreased air entry bilaterally secondary to decreased inspiratory effort, rales at bases  GI: NABS, soft, NT  LE: Warm and well-perfused  Assessment & Plan:   Acute delirium tremens Patient attempted to stop drinking 7 days PTA, admitted already in acute DTs Patient started on MedSurg CIWA protocol with q 6-hour lorazepam  Patient remained agitated and he was increased to the stepdown CIWA protocol with some improvement.  I have had to give him extra doses of Ativan per RN request Would have low threshold for referral to ICU if continues agitated  Irregularly irregular narrow complex tachycardia EKG from yesterday and today shows narrow complex tachycardia with P waves Reviewed with Dr. Audery Blazing in cardiology who agrees this is likely sinus tachycardia with PACs.  Patient's BP is low normal 110s/60s. Will try to control hyperadrenergic state with Ativan Will not start beta-blocker right  now given lowish BP  Hyponatremia Patient has been having nausea and vomiting with decreased p.o. intake x 7 days Patient received 2 L NS bolus in ED Continue LR 125 an hour Repeat sodium tomorrow  AKI Improved but not normalized with hydration Continue hydration, follow creatinine CT abdomen shows 2x2 left renal cyst with Hounsfield unit of 26-32, thought to be proteinaceous/hemorrhagic cyst.  Pancreatitis Likely secondary to alcohol Treat patient conservatively with IVF and bowel rest CT abdomen shows "unremarkable" pancreas with no inflammatory changes  Alcoholic hepatitis Elevated LFTs improving but not normalized overnight Treated with alcohol cessation and hydration CT abdomen shows normal-sized liver that is not cirrhotic but he does have diffuse hepatic steatosis.  Liver US  does show biliary sludge and small gallstones and GB, no cholecystitis.  Guaiac positive stool H&H have been stable since admission Seen by GI, no further inpatient workup required Continue IV PPI   DVT prophylaxis: SCD Code Status: Full Family Communication: None today, ex-wife was updated yesterday, she accompanied patient to ED Disposition: Likely home but TBD

## 2024-04-19 NOTE — Progress Notes (Signed)
 Pt disoriented and trying to get out of bed, bed alarm on. Pt ST on tele with HR up to 160s at times. MD notified. Prn ativan ordered. Pt still tachy at times. MD stated to call if hr > 150 sustained for 30 min or pt having CP or SOB.

## 2024-04-20 DIAGNOSIS — F10931 Alcohol use, unspecified with withdrawal delirium: Secondary | ICD-10-CM | POA: Diagnosis not present

## 2024-04-20 DIAGNOSIS — N179 Acute kidney failure, unspecified: Secondary | ICD-10-CM

## 2024-04-20 DIAGNOSIS — K7010 Alcoholic hepatitis without ascites: Secondary | ICD-10-CM | POA: Diagnosis not present

## 2024-04-20 DIAGNOSIS — K922 Gastrointestinal hemorrhage, unspecified: Secondary | ICD-10-CM

## 2024-04-20 LAB — COMPREHENSIVE METABOLIC PANEL WITH GFR
ALT: 88 U/L — ABNORMAL HIGH (ref 0–44)
AST: 128 U/L — ABNORMAL HIGH (ref 15–41)
Albumin: 2.7 g/dL — ABNORMAL LOW (ref 3.5–5.0)
Alkaline Phosphatase: 123 U/L (ref 38–126)
Anion gap: 17 — ABNORMAL HIGH (ref 5–15)
BUN: 25 mg/dL — ABNORMAL HIGH (ref 8–23)
CO2: 23 mmol/L (ref 22–32)
Calcium: 8.6 mg/dL — ABNORMAL LOW (ref 8.9–10.3)
Chloride: 92 mmol/L — ABNORMAL LOW (ref 98–111)
Creatinine, Ser: 1.25 mg/dL — ABNORMAL HIGH (ref 0.61–1.24)
GFR, Estimated: >60 mL/min (ref >60)
Glucose, Bld: 111 mg/dL — ABNORMAL HIGH (ref 70–99)
Potassium: 3.2 mmol/L — ABNORMAL LOW (ref 3.5–5.1)
Sodium: 132 mmol/L — ABNORMAL LOW (ref 135–145)
Total Bilirubin: 4.6 mg/dL — ABNORMAL HIGH (ref 0.0–1.2)
Total Protein: 5.4 g/dL — ABNORMAL LOW (ref 6.5–8.1)

## 2024-04-20 LAB — CBC
HCT: 26.6 % — ABNORMAL LOW (ref 39.0–52.0)
HCT: 27.1 % — ABNORMAL LOW (ref 39.0–52.0)
Hemoglobin: 9.3 g/dL — ABNORMAL LOW (ref 13.0–17.0)
Hemoglobin: 9.7 g/dL — ABNORMAL LOW (ref 13.0–17.0)
MCH: 32.5 pg (ref 26.0–34.0)
MCH: 33.7 pg (ref 26.0–34.0)
MCHC: 35 g/dL (ref 30.0–36.0)
MCHC: 35.8 g/dL (ref 30.0–36.0)
MCV: 93 fL (ref 80.0–100.0)
MCV: 94.1 fL (ref 80.0–100.0)
Platelets: 65 10*3/uL — ABNORMAL LOW (ref 150–400)
Platelets: 87 10*3/uL — ABNORMAL LOW (ref 150–400)
RBC: 2.86 MIL/uL — ABNORMAL LOW (ref 4.22–5.81)
RBC: 2.88 MIL/uL — ABNORMAL LOW (ref 4.22–5.81)
RDW: 13 % (ref 11.5–15.5)
RDW: 13.2 % (ref 11.5–15.5)
WBC: 11.5 10*3/uL — ABNORMAL HIGH (ref 4.0–10.5)
WBC: 9.6 10*3/uL (ref 4.0–10.5)
nRBC: 0.2 % (ref 0.0–0.2)
nRBC: 0 % (ref 0.0–0.2)

## 2024-04-20 LAB — TYPE AND SCREEN
ABO/RH(D): A POS
Antibody Screen: NEGATIVE

## 2024-04-20 LAB — LIPASE, BLOOD: Lipase: 266 U/L — ABNORMAL HIGH (ref 11–51)

## 2024-04-20 LAB — MAGNESIUM: Magnesium: 1.4 mg/dL — ABNORMAL LOW (ref 1.7–2.4)

## 2024-04-20 MED ORDER — MAGNESIUM SULFATE 4 GM/100ML IV SOLN
4.0000 g | Freq: Once | INTRAVENOUS | Status: AC
  Administered 2024-04-20: 4 g via INTRAVENOUS
  Filled 2024-04-20: qty 100

## 2024-04-20 MED ORDER — METOPROLOL TARTRATE 12.5 MG HALF TABLET
12.5000 mg | ORAL_TABLET | Freq: Three times a day (TID) | ORAL | Status: DC
  Administered 2024-04-20: 12.5 mg via ORAL
  Filled 2024-04-20: qty 1

## 2024-04-20 MED ORDER — POTASSIUM CHLORIDE 10 MEQ/100ML IV SOLN
10.0000 meq | INTRAVENOUS | Status: AC
  Administered 2024-04-20 (×3): 10 meq via INTRAVENOUS
  Filled 2024-04-20 (×3): qty 100

## 2024-04-20 MED ORDER — LOPERAMIDE HCL 2 MG PO CAPS: 2.0000 mg | ORAL_CAPSULE | ORAL | Status: DC | PRN

## 2024-04-20 MED ORDER — CHLORDIAZEPOXIDE HCL 5 MG PO CAPS
20.0000 mg | ORAL_CAPSULE | Freq: Three times a day (TID) | ORAL | Status: DC
  Administered 2024-04-20: 20 mg via ORAL
  Filled 2024-04-20 (×3): qty 4

## 2024-04-20 MED ORDER — METOPROLOL TARTRATE 12.5 MG HALF TABLET
12.5000 mg | ORAL_TABLET | Freq: Two times a day (BID) | ORAL | Status: DC
  Administered 2024-04-20: 12.5 mg via ORAL
  Filled 2024-04-20: qty 1

## 2024-04-20 NOTE — Progress Notes (Signed)
 SLP Cancellation Note  Patient Details Name: Tony Lopez MRN: [id number] DOB: 1951/06/26   Cancelled treatment:       Reason Eval/Treat Not Completed: Fatigue/lethargy limiting ability to participate (Pt unable to participate in swallow evaluation after receipt of Ativan for agitation. SLP will follow up on subsequent date.)  Jean Skow I. Valda Garnet, MS, CCC-SLP Acute Rehabilitation Services Office number [phone]  Tony Lopez 04/20/2024, 3:12 PM

## 2024-04-20 NOTE — Plan of Care (Signed)

## 2024-04-20 NOTE — Plan of Care (Signed)
  Problem: Clinical Measurements: Goal: Diagnostic test results will improve Outcome: Progressing   Problem: Activity: Goal: Risk for activity intolerance will decrease Outcome: Progressing   Problem: Coping: Goal: Level of anxiety will decrease Outcome: Progressing   Problem: Elimination: Goal: Will not experience complications related to bowel motility Outcome: Progressing Goal: Will not experience complications related to urinary retention Outcome: Progressing   Problem: Safety: Goal: Ability to remain free from injury will improve Outcome: Progressing

## 2024-04-20 NOTE — Progress Notes (Addendum)
 PROGRESS NOTE        PATIENT DETAILS Name: Tony Lopez Age: 73 y.o. Sex: male Date of Birth: Oct 23, 1951 Admit Date: 04/18/2024 Admitting Physician [name], MD ZOX:[id number], [name], [name]  Brief Summary: Patient is a 73 y.o.  male with history of HTN, prostate cancer, GERD, EtOH use-who was referred from [facility] to ED for alcohol withdrawal symptoms.  Significant events: 5/23>> admit to [facility].  Significant studies: 5/23>> CT head: No acute intracranial abnormality 5/23>> CT abdomen/pelvis: No acute abnormality. 5/24>> RUQ ultrasound: Biliary sludge/small gallstones.  No evidence of acute cholecystitis.  Significant microbiology data: None  Procedures: None  Consults: GI  Subjective: Sleepy/lethargic but awakes-follows commands-mumbled-slow speech.  Not in any distress.  Objective: Vitals: Blood pressure (!) 113/54, pulse (!) 122, temperature 99 F (37.2 C), temperature source Oral, resp. rate 20, height 6' (1.829 m), weight 93 kg, SpO2 91%.   Exam: Gen Exam:not in any distress HEENT:atraumatic, normocephalic Chest: B/L clear to auscultation anteriorly CVS:S1S2 regular-tachycardic. Abdomen:soft non tender, non distended Extremities:no edema Neurology: Non focal-generalized weakness. Skin: no rash  Pertinent Labs/Radiology:    Latest Ref Rng & Units 04/20/2024    5:28 AM 04/19/2024    2:26 PM 04/19/2024    8:15 AM  CBC  WBC 4.0 - 10.5 K/uL 11.5  9.3  7.5   Hemoglobin 13.0 - 17.0 g/dL 9.3  9.9  9.6   Hematocrit 39.0 - 52.0 % 26.6  27.2  27.2   Platelets 150 - 400 K/uL 65  54  52     Lab Results  Component Value Date   NA 132 (L) 04/20/2024   K 3.2 (L) 04/20/2024   CL 92 (L) 04/20/2024   CO2 23 04/20/2024      Assessment/Plan: EtOH withdrawal with DTs Per psych NP documentation-last drink was on 5/22 (1 quart of whiskey will last every 2 days)  Awake-lethargic but following commands-in mittens.   Tachycardic-120s-140s. Add low-dose scheduled Librium-will taper slowly over the next several days. Continue Ativan per CIWA protocol Watch closely-if deteriorates any further-will need to be monitored in the ICU.  Sinus tachycardia with PACs Secondary to DTs Adding low-dose beta-blocker with holding parameters Continue to treat underlying DTs/alcohol withdrawal  AKI Hemodynamically mediated-poor oral intake/use of lisinopril /HCTZ Improving with IVF  Hyponatremia Secondary to dehydration/poor oral intake/HCTZ use Improving with IVF  Hypokalemia Secondary to EtOH use Replete/recheck  Hypomagnesemia Secondary to EtOH use Replete/recheck.  Elevated lipase level No abdominal pain-no evidence of pancreatitis on CT Unclear significance-doubt pancreatitis at this point. Supportive care.  EtOH hepatitis Supportive care Per GI note-no indication for steroid as discriminant function I<10.  Thrombocytopenia Secondary to EtOH use Follow No evidence of bleeding  Normocytic anemia Probably related to EtOH related bone marrow suppression No evidence of acute blood loss-although FOBT positive Trend CBC for now.  Guaiac positive stool Outpatient GI follow-up-as no evidence of active bleeding.  Hb stable.  Cholelithiasis Asymptomatic Incidental finding on RUQ ultrasound. Outpatient general surgery evaluation.  1.6 x 2.4 cm structure arising from left kidney Incidental finding on CT abdomen. Per radiology-favored to represent proteinaceous/hemorrhagic cyst Will need outpatient imaging postdischarge.  BMI: Estimated body mass index is 27.81 kg/m as calculated from the following:   Height as of this encounter: 6' (1.829 m).   Weight as of this encounter: 93 kg.   Code status:  Code Status: Full Code   DVT Prophylaxis: SCDs Start: 04/18/24 2024   Family Communication: Ex wife-Nikki Blatz [phone] updated over the phone 5/25   Disposition Plan: Status is:  Inpatient Remains inpatient appropriate because: Severity of illness   Planned Discharge Destination:Home health   Diet: Diet Order             Diet clear liquid Room service appropriate? Yes; Fluid consistency: Thin  Diet effective now                     Antimicrobial agents: Anti-infectives (From admission, onward)    None        MEDICATIONS: Scheduled Meds:  allopurinol  100 mg Oral Daily   chlordiazePOXIDE  20 mg Oral TID   folic acid  1 mg Oral Daily   lidocaine   1 patch Transdermal Q24H   LORazepam  0-4 mg Intravenous Q4H   Followed by   Cecily Cohen ON 04/21/2024] LORazepam  0-4 mg Intravenous Q8H   metoprolol tartrate  12.5 mg Oral BID   multivitamin with minerals  1 tablet Oral Daily   pantoprazole (PROTONIX) IV  40 mg Intravenous Q12H   thiamine  100 mg Oral Daily   Or   thiamine  100 mg Intravenous Daily   Continuous Infusions:  lactated ringers  125 mL/hr at 04/20/24 0449   PRN Meds:.LORazepam, ondansetron  **OR** ondansetron  (ZOFRAN ) IV   I have personally reviewed following labs and imaging studies  LABORATORY DATA: CBC: Recent Labs  Lab 04/18/24 1354 04/18/24 2128 04/19/24 0217 04/19/24 0815 04/19/24 1426 04/20/24 0528  WBC 10.7* 7.6 7.3 7.5 9.3 11.5*  NEUTROABS 9.1*  --   --   --   --   --   HGB 11.1* 9.9* 9.2* 9.6* 9.9* 9.3*  HCT 31.3* 28.1* 25.8* 27.2* 27.2* 26.6*  MCV 94.8 94.3 95.2 95.4 93.8 93.0  PLT 66* 46* 46* 52* 54* 65*    Basic Metabolic Panel: Recent Labs  Lab 04/18/24 1354 04/19/24 0217 04/20/24 0528  NA 128* 128* 132*  K 4.1 3.4* 3.2*  CL 85* 90* 92*  CO2 17* 17* 23  GLUCOSE 127* 84 111*  BUN 50* 50* 25*  CREATININE 2.44* 2.06* 1.25*  CALCIUM 8.9 8.3* 8.6*  MG  --   --  1.4*    GFR: Estimated Creatinine Clearance: 59.5 mL/min (A) (by C-G formula based on SCr of 1.25 mg/dL (H)).  Liver Function Tests: Recent Labs  Lab 04/18/24 1354 04/19/24 0217 04/20/24 0528  AST 254* 178* 128*  ALT 139* 103* 88*   ALKPHOS 144* 123 123  BILITOT 6.0* 5.5* 4.6*  PROT 6.1* 5.0* 5.4*  ALBUMIN 3.3* 2.7* 2.7*   Recent Labs  Lab 04/18/24 1354 04/20/24 0528  LIPASE 253* 266*   No results for input(s): "AMMONIA" in the last 168 hours.  Coagulation Profile: Recent Labs  Lab 04/18/24 1437  INR 0.9    Cardiac Enzymes: No results for input(s): "CKTOTAL", "CKMB", "CKMBINDEX", "TROPONINI" in the last 168 hours.  BNP (last 3 results) No results for input(s): "PROBNP" in the last 8760 hours.  Lipid Profile: No results for input(s): "CHOL", "HDL", "LDLCALC", "TRIG", "CHOLHDL", "LDLDIRECT" in the last 72 hours.  Thyroid Function Tests: No results for input(s): "TSH", "T4TOTAL", "FREET4", "T3FREE", "THYROIDAB" in the last 72 hours.  Anemia Panel: No results for input(s): "VITAMINB12", "FOLATE", "FERRITIN", "TIBC", "IRON", "RETICCTPCT" in the last 72 hours.  Urine analysis: No results found for: "COLORURINE", "APPEARANCEUR", "LABSPEC", "PHURINE", "GLUCOSEU", "HGBUR", "BILIRUBINUR", "KETONESUR", "  PROTEINUR", "UROBILINOGEN", "NITRITE", "LEUKOCYTESUR"  Sepsis Labs: Lactic Acid, Venous No results found for: "LATICACIDVEN"  MICROBIOLOGY: No results found for this or any previous visit (from the past 240 hours).  RADIOLOGY STUDIES/RESULTS: US  Abdomen Limited RUQ (LIVER/GB) Result Date: 04/19/2024 CLINICAL DATA:  Abnormal liver function tests, alcohol abuse, hepatitis C. EXAM: ULTRASOUND ABDOMEN LIMITED RIGHT UPPER QUADRANT COMPARISON:  CT of the abdomen on 04/18/2024 FINDINGS: Gallbladder: There is some biliary sludge in the gallbladder as well as some probable small gallstones. No evidence of gallbladder wall thickening or sonographic Murphy's sign. Common bile duct: Diameter: 6-7 mm Liver: The liver demonstrates coarse echotexture and increased echogenicity, likely reflecting diffuse steatosis. No overt cirrhotic contour abnormalities or focal lesions are identified. There is no evidence of intrahepatic  biliary ductal dilatation. Portal vein is patent on color Doppler imaging with normal direction of blood flow towards the liver. Other: No ascites visualized in the right upper quadrant. IMPRESSION: 1. Biliary sludge and probable small gallstones in the gallbladder. No evidence of acute cholecystitis. 2. Diffuse hepatic steatosis. Electronically Signed   By: Erica Hau M.D.   On: 04/19/2024 09:34   CT ABDOMEN PELVIS WO CONTRAST Result Date: 04/18/2024 CLINICAL DATA:  Abdominal pain, acute, nonlocalized. History of prostate carcinoma. * Tracking Code: BO * EXAM: CT ABDOMEN AND PELVIS WITHOUT CONTRAST TECHNIQUE: Multidetector CT imaging of the abdomen and pelvis was performed following the standard protocol without IV contrast. RADIATION DOSE REDUCTION: This exam was performed according to the departmental dose-optimization program which includes automated exposure control, adjustment of the mA and/or kV according to patient size and/or use of iterative reconstruction technique. COMPARISON:  None Available. FINDINGS: Lower chest: The lung bases are clear. No pleural effusion. The heart is normal in size. No pericardial effusion. Partially seen lipomatous hypertrophy of interatrial septum. Hepatobiliary: The liver is normal in size. Non-cirrhotic configuration. No suspicious mass. There is marked diffuse hepatic steatosis. No intrahepatic or extrahepatic bile duct dilation. Small volume subcentimeter sized calcified gallstones without imaging signs of acute cholecystitis. Normal gallbladder wall thickness. No pericholecystic inflammatory changes. Pancreas: Unremarkable. No pancreatic ductal dilatation or surrounding inflammatory changes. Spleen: Within normal limits. No focal lesion. Adrenals/Urinary Tract: Adrenal glands are unremarkable. There is a partially exophytic approximately 1.6 x 2.4 cm mildly hypoattenuating structure (in relation to the renal cortex) arising from the left kidney interpolar region,  laterally, with internal CT attenuation of 26-32 Hounsfield units. This cannot be characterized as a simple cyst. This is incompletely characterized on the current exam but favored to represent a proteinaceous/hemorrhagic cyst. Comparison can be made if prior imaging is available. Otherwise, consider further evaluation with ultrasound. No nephroureterolithiasis or obstructive uropathy on either side. Unremarkable urinary bladder. Stomach/Bowel: No disproportionate dilation of the small or large bowel loops. No evidence of abnormal bowel wall thickening or inflammatory changes. The appendix is unremarkable. There are scattered diverticula mainly in the left hemi colon, without imaging signs of diverticulitis. Vascular/Lymphatic: No ascites or pneumoperitoneum. No abdominal or pelvic lymphadenopathy, by size criteria. No aneurysmal dilation of the major abdominal arteries. There are moderate peripheral atherosclerotic vascular calcifications of the aorta and its major branches. Reproductive: Patient is status post surgical removal of prostate and bilateral seminal vesicles. Other: There is a tiny fat containing umbilical hernia. The soft tissues and abdominal wall are otherwise unremarkable. Musculoskeletal: No suspicious osseous lesions. There are mild multilevel degenerative changes in the visualized spine. IMPRESSION: 1. No acute inflammatory process identified within the abdomen or pelvis. No nephroureterolithiasis or obstructive  uropathy on either side. 2. There is a partially exophytic approximately 1.6 x 2.4 cm mildly hypoattenuating structure arising from the left kidney interpolar region, laterally, with internal CT attenuation of 26-32 Hounsfield units. This cannot be characterized as a simple cyst. This is incompletely characterized on the current exam but favored to represent a proteinaceous/hemorrhagic cyst. Comparison can be made if prior imaging is available. Otherwise, consider further evaluation with  ultrasound. 3. No metastatic prostate carcinoma seen in the abdomen or pelvis. 4. Multiple other nonacute observations, as described above. Aortic Atherosclerosis (ICD10-I70.0). Electronically Signed   By: Beula Brunswick M.D.   On: 04/18/2024 16:27   CT Head Wo Contrast Result Date: 04/18/2024 CLINICAL DATA:  Mental status change, unknown cause EXAM: CT HEAD WITHOUT CONTRAST TECHNIQUE: Contiguous axial images were obtained from the base of the skull through the vertex without intravenous contrast. RADIATION DOSE REDUCTION: This exam was performed according to the departmental dose-optimization program which includes automated exposure control, adjustment of the mA and/or kV according to patient size and/or use of iterative reconstruction technique. COMPARISON:  CT head March 14, 2007. FINDINGS: Brain: No evidence of acute infarction, hemorrhage, hydrocephalus, extra-axial collection or mass lesion/mass effect. Small remote right cerebellar infarct. Vascular: No hyperdense vessel.  Calcific atherosclerosis. Skull: No acute fracture. Sinuses/Orbits: Clear sinuses.  No acute orbital findings. Other: No mastoid effusions. IMPRESSION: No evidence of acute intracranial abnormality. Electronically Signed   By: Stevenson Elbe M.D.   On: 04/18/2024 15:37     LOS: 2 days   Kimberly Penna, MD  [facility]    To contact the attending provider between 7A-7P or the covering provider during after hours 7P-7A, please log into the web site [email or web address] and access using universal [place] password for that web site. If you do not have the password, please call the hospital operator.  04/20/2024, 9:38 AM

## 2024-04-21 ENCOUNTER — Inpatient Hospital Stay (HOSPITAL_COMMUNITY)

## 2024-04-21 DIAGNOSIS — F10931 Alcohol use, unspecified with withdrawal delirium: Secondary | ICD-10-CM | POA: Diagnosis not present

## 2024-04-21 DIAGNOSIS — N179 Acute kidney failure, unspecified: Secondary | ICD-10-CM | POA: Diagnosis not present

## 2024-04-21 DIAGNOSIS — K922 Gastrointestinal hemorrhage, unspecified: Secondary | ICD-10-CM | POA: Diagnosis not present

## 2024-04-21 DIAGNOSIS — K7010 Alcoholic hepatitis without ascites: Secondary | ICD-10-CM | POA: Diagnosis not present

## 2024-04-21 LAB — COMPREHENSIVE METABOLIC PANEL WITH GFR
ALT: 65 U/L — ABNORMAL HIGH (ref 0–44)
AST: 82 U/L — ABNORMAL HIGH (ref 15–41)
Albumin: 2.4 g/dL — ABNORMAL LOW (ref 3.5–5.0)
Alkaline Phosphatase: 109 U/L (ref 38–126)
Anion gap: 11 (ref 5–15)
BUN: 21 mg/dL (ref 8–23)
CO2: 26 mmol/L (ref 22–32)
Calcium: 8.4 mg/dL — ABNORMAL LOW (ref 8.9–10.3)
Chloride: 96 mmol/L — ABNORMAL LOW (ref 98–111)
Creatinine, Ser: 0.9 mg/dL (ref 0.61–1.24)
GFR, Estimated: >60 mL/min (ref >60)
Glucose, Bld: 138 mg/dL — ABNORMAL HIGH (ref 70–99)
Potassium: 3 mmol/L — ABNORMAL LOW (ref 3.5–5.1)
Sodium: 133 mmol/L — ABNORMAL LOW (ref 135–145)
Total Bilirubin: 3.1 mg/dL — ABNORMAL HIGH (ref 0.0–1.2)
Total Protein: 5 g/dL — ABNORMAL LOW (ref 6.5–8.1)

## 2024-04-21 LAB — BASIC METABOLIC PANEL WITH GFR
Anion gap: 10 (ref 5–15)
BUN: 18 mg/dL (ref 8–23)
CO2: 26 mmol/L (ref 22–32)
Calcium: 8.2 mg/dL — ABNORMAL LOW (ref 8.9–10.3)
Chloride: 98 mmol/L (ref 98–111)
Creatinine, Ser: 0.8 mg/dL (ref 0.61–1.24)
GFR, Estimated: >60 mL/min (ref >60)
Glucose, Bld: 152 mg/dL — ABNORMAL HIGH (ref 70–99)
Potassium: 3.7 mmol/L (ref 3.5–5.1)
Sodium: 134 mmol/L — ABNORMAL LOW (ref 135–145)

## 2024-04-21 LAB — URINALYSIS, ROUTINE W REFLEX MICROSCOPIC
Bilirubin Urine: NEGATIVE
Glucose, UA: NEGATIVE mg/dL
Ketones, ur: 5 mg/dL — AB
Leukocytes,Ua: NEGATIVE
Nitrite: NEGATIVE
Protein, ur: 30 mg/dL — AB
Specific Gravity, Urine: 1.027 (ref 1.005–1.030)
pH: 5 (ref 5.0–8.0)

## 2024-04-21 LAB — GASTROINTESTINAL PANEL BY PCR, STOOL (REPLACES STOOL CULTURE)

## 2024-04-21 LAB — CBC
HCT: 24.3 % — ABNORMAL LOW (ref 39.0–52.0)
Hemoglobin: 8.6 g/dL — ABNORMAL LOW (ref 13.0–17.0)
MCH: 33.3 pg (ref 26.0–34.0)
MCHC: 35.4 g/dL (ref 30.0–36.0)
MCV: 94.2 fL (ref 80.0–100.0)
Platelets: 95 10*3/uL — ABNORMAL LOW (ref 150–400)
RBC: 2.58 MIL/uL — ABNORMAL LOW (ref 4.22–5.81)
RDW: 13.5 % (ref 11.5–15.5)
WBC: 10.1 10*3/uL (ref 4.0–10.5)
nRBC: 0 % (ref 0.0–0.2)

## 2024-04-21 LAB — MAGNESIUM: Magnesium: 1.8 mg/dL (ref 1.7–2.4)

## 2024-04-21 LAB — PHOSPHORUS
Phosphorus: <1 mg/dL — CL (ref 2.5–4.6)
Phosphorus: 2 mg/dL — ABNORMAL LOW (ref 2.5–4.6)

## 2024-04-21 LAB — ABO/RH: ABO/RH(D): A POS

## 2024-04-21 MED ORDER — POTASSIUM PHOSPHATES 15 MMOLE/5ML IV SOLN
30.0000 mmol | Freq: Once | INTRAVENOUS | Status: AC
  Administered 2024-04-21: 30 mmol via INTRAVENOUS
  Filled 2024-04-21: qty 10

## 2024-04-21 MED ORDER — POTASSIUM CHLORIDE 10 MEQ/100ML IV SOLN: 10.0000 meq | INTRAVENOUS | Status: DC

## 2024-04-21 MED ORDER — METOPROLOL TARTRATE 5 MG/5ML IV SOLN
5.0000 mg | Freq: Three times a day (TID) | INTRAVENOUS | Status: DC
  Administered 2024-04-21 – 2024-04-23 (×5): 5 mg via INTRAVENOUS
  Filled 2024-04-21 (×6): qty 5

## 2024-04-21 MED ORDER — POTASSIUM CHLORIDE 10 MEQ/100ML IV SOLN
10.0000 meq | INTRAVENOUS | Status: AC
  Administered 2024-04-21 (×3): 10 meq via INTRAVENOUS
  Filled 2024-04-21 (×3): qty 100

## 2024-04-21 MED ORDER — SODIUM CHLORIDE 0.9 % IV BOLUS
1000.0000 mL | INTRAVENOUS | Status: AC
  Administered 2024-04-21: 1000 mL via INTRAVENOUS

## 2024-04-21 MED ORDER — NICOTINE 21 MG/24HR TD PT24
21.0000 mg | MEDICATED_PATCH | Freq: Every day | TRANSDERMAL | Status: DC
  Administered 2024-04-21 – 2024-05-02 (×11): 21 mg via TRANSDERMAL
  Filled 2024-04-21 (×11): qty 1

## 2024-04-21 MED ORDER — ACETAMINOPHEN 10 MG/ML IV SOLN: 1000.0000 mg | Freq: Four times a day (QID) | INTRAVENOUS | Status: DC | PRN

## 2024-04-21 MED ORDER — MAGNESIUM SULFATE 2 GM/50ML IV SOLN
2.0000 g | Freq: Once | INTRAVENOUS | Status: AC
  Administered 2024-04-21: 2 g via INTRAVENOUS
  Filled 2024-04-21: qty 50

## 2024-04-21 MED ORDER — ACETAMINOPHEN 10 MG/ML IV SOLN
1000.0000 mg | Freq: Three times a day (TID) | INTRAVENOUS | Status: AC | PRN
  Administered 2024-04-21: 1000 mg via INTRAVENOUS
  Filled 2024-04-21: qty 100

## 2024-04-21 NOTE — Progress Notes (Addendum)
 PROGRESS NOTE        PATIENT DETAILS Name: Tony Lopez Age: 73 y.o. Sex: male Date of Birth: [birth year] Admit Date: 04/18/2024 Admitting Physician [name], MD UJW:[id number], [name], [name]  Brief Summary: Patient is a 73 y.o.  male with history of HTN, prostate cancer, GERD, EtOH use-who was referred from [facility] to ED for alcohol withdrawal symptoms.  Significant events: 5/23>> admit to [facility]. 5/26>>Fever-blood culture- gm neg rod/gm +ve rod  Significant studies: 5/23>> CT head: No acute intracranial abnormality 5/23>> CT abdomen/pelvis: No acute abnormality. 5/24>> RUQ ultrasound: Biliary sludge/small gallstones.  No evidence of acute cholecystitis.  Significant microbiology data: None  Procedures: None  Consults: GI  Subjective: Lethargic but awakes-able to tell me his name-able to move all 4 extremities when asked.  Not accumulating secretions-no gurgling.  Objective: Vitals: Blood pressure (!) 111/58, pulse 86, temperature 98.1 F (36.7 C), temperature source Axillary, resp. rate 20, height 6' (1.829 m), weight 93 kg, SpO2 92%.   Exam: Awake/alert-though lethargic-easily arouses to loud verbal stimuli Nonfocal exam-but has generalized weakness Chest: Clear to auscultation CVS: S1-S2-slightly tachycardic Abdomen: Soft nontender nondistended Extremity: No edema  Pertinent Labs/Radiology:    Latest Ref Rng & Units 04/21/2024    3:32 AM 04/20/2024    6:46 PM 04/20/2024    5:28 AM  CBC  WBC 4.0 - 10.5 K/uL 10.1  9.6  11.5   Hemoglobin 13.0 - 17.0 g/dL 8.6  9.7  9.3   Hematocrit 39.0 - 52.0 % 24.3  27.1  26.6   Platelets 150 - 400 K/uL 95  87  65     Lab Results  Component Value Date   NA 133 (L) 04/21/2024   K 3.0 (L) 04/21/2024   CL 96 (L) 04/21/2024   CO2 26 04/21/2024      Assessment/Plan: EtOH withdrawal with DTs Per psych NP documentation-last drink was on 5/22 (1 quart of whiskey will last every 2 days)  Still  lethargic but awakes easily to loud stimuli-remains in mittens but following commands Tachycardia much better today Given significant lethargy-stop Librium Continue Ativan per CIWA Continue to monitor closely-if he deteriorates-transferred to ICU.  Sinus tachycardia with PACs Secondary to DTs Tachycardia much better today after initiation of low-dose beta-blockers. Continue to treat underlying DTs/alcohol withdrawal.  Low grade fever  Earlier this am Check CXR to r/o asp pna, UA and blood cultures Monitor off antibiotics for now  AKI Hemodynamically mediated-poor oral intake/use of lisinopril /HCTZ Improving with IVF  Hyponatremia Secondary to dehydration/poor oral intake/HCTZ use Resolved.  Hypokalemia Secondary to EtOH use Continue to replete/recheck.  Hypomagnesemia Secondary to EtOH use Replete/recheck.  Hypophosphatemia Secondary to EtOH use Replete and recheck tomorrow morning.  Diarrhea Prn immodium GI pathogen panel pending  Elevated lipase level No abdominal pain-no evidence of pancreatitis on CT Unclear significance-doubt pancreatitis at this point. Supportive care.  EtOH hepatitis Supportive care Per GI note-no indication for steroid as discriminant function I<10.  Thrombocytopenia Secondary to EtOH use Continues to have mild thrombocytopenia-but this slowly improving. No evidence of bleeding  Normocytic anemia Probably related to EtOH related bone marrow suppression No evidence of acute blood loss-although FOBT positive Trend CBC for now.  Guaiac positive stool Outpatient GI follow-up-as no evidence of active bleeding.  Hb stable.  Cholelithiasis Asymptomatic Incidental finding on RUQ ultrasound. Outpatient general surgery evaluation.  1.6  x 2.4 cm structure arising from left kidney Incidental finding on CT abdomen. Per radiology-favored to represent proteinaceous/hemorrhagic cyst Will need outpatient imaging  postdischarge.  BMI: Estimated body mass index is 27.81 kg/m as calculated from the following:   Height as of this encounter: 6' (1.829 m).   Weight as of this encounter: 93 kg.   Code status:   Code Status: Full Code   DVT Prophylaxis: SCDs Start: 04/18/24 2024   Family Communication: Ex wife-Nikki Frate [phone] updated over the phone 5/25   Disposition Plan: Status is: Inpatient Remains inpatient appropriate because: Severity of illness   Planned Discharge Destination:Home health   Diet: Diet Order             Diet clear liquid Room service appropriate? Yes; Fluid consistency: Thin  Diet effective now                     Antimicrobial agents: Anti-infectives (From admission, onward)    None        MEDICATIONS: Scheduled Meds:  allopurinol  100 mg Oral Daily   folic acid  1 mg Oral Daily   lidocaine   1 patch Transdermal Q24H   LORazepam  0-4 mg Intravenous Q8H   metoprolol tartrate  5 mg Intravenous Q8H   multivitamin with minerals  1 tablet Oral Daily   pantoprazole (PROTONIX) IV  40 mg Intravenous Q12H   thiamine  100 mg Oral Daily   Or   thiamine  100 mg Intravenous Daily   Continuous Infusions:  acetaminophen  1,000 mg (04/21/24 0427)   lactated ringers  75 mL/hr at 04/21/24 0414   potassium PHOSPHATE IVPB (in mmol) 30 mmol (04/21/24 0908)   PRN Meds:.acetaminophen , loperamide, LORazepam, ondansetron  **OR** ondansetron  (ZOFRAN ) IV   I have personally reviewed following labs and imaging studies  LABORATORY DATA: CBC: Recent Labs  Lab 04/18/24 1354 04/18/24 2128 04/19/24 0815 04/19/24 1426 04/20/24 0528 04/20/24 1846 04/21/24 0332  WBC 10.7*   < > 7.5 9.3 11.5* 9.6 10.1  NEUTROABS 9.1*  --   --   --   --   --   --   HGB 11.1*   < > 9.6* 9.9* 9.3* 9.7* 8.6*  HCT 31.3*   < > 27.2* 27.2* 26.6* 27.1* 24.3*  MCV 94.8   < > 95.4 93.8 93.0 94.1 94.2  PLT 66*   < > 52* 54* 65* 87* 95*   < > = values in this interval not  displayed.    Basic Metabolic Panel: Recent Labs  Lab 04/18/24 1354 04/19/24 0217 04/20/24 0528 04/21/24 0332  NA 128* 128* 132* 133*  K 4.1 3.4* 3.2* 3.0*  CL 85* 90* 92* 96*  CO2 17* 17* 23 26  GLUCOSE 127* 84 111* 138*  BUN 50* 50* 25* 21  CREATININE 2.44* 2.06* 1.25* 0.90  CALCIUM 8.9 8.3* 8.6* 8.4*  MG  --   --  1.4* 1.8  PHOS  --   --   --  <1.0*    GFR: Estimated Creatinine Clearance: 82.6 mL/min (by C-G formula based on SCr of 0.9 mg/dL).  Liver Function Tests: Recent Labs  Lab 04/18/24 1354 04/19/24 0217 04/20/24 0528 04/21/24 0332  AST 254* 178* 128* 82*  ALT 139* 103* 88* 65*  ALKPHOS 144* 123 123 109  BILITOT 6.0* 5.5* 4.6* 3.1*  PROT 6.1* 5.0* 5.4* 5.0*  ALBUMIN 3.3* 2.7* 2.7* 2.4*   Recent Labs  Lab 04/18/24 1354 04/20/24 0528  LIPASE 253* 266*  No results for input(s): "AMMONIA" in the last 168 hours.  Coagulation Profile: Recent Labs  Lab 04/18/24 1437  INR 0.9    Cardiac Enzymes: No results for input(s): "CKTOTAL", "CKMB", "CKMBINDEX", "TROPONINI" in the last 168 hours.  BNP (last 3 results) No results for input(s): "PROBNP" in the last 8760 hours.  Lipid Profile: No results for input(s): "CHOL", "HDL", "LDLCALC", "TRIG", "CHOLHDL", "LDLDIRECT" in the last 72 hours.  Thyroid Function Tests: No results for input(s): "TSH", "T4TOTAL", "FREET4", "T3FREE", "THYROIDAB" in the last 72 hours.  Anemia Panel: No results for input(s): "VITAMINB12", "FOLATE", "FERRITIN", "TIBC", "IRON", "RETICCTPCT" in the last 72 hours.  Urine analysis: No results found for: "COLORURINE", "APPEARANCEUR", "LABSPEC", "PHURINE", "GLUCOSEU", "HGBUR", "BILIRUBINUR", "KETONESUR", "PROTEINUR", "UROBILINOGEN", "NITRITE", "LEUKOCYTESUR"  Sepsis Labs: Lactic Acid, Venous No results found for: "LATICACIDVEN"  MICROBIOLOGY: No results found for this or any previous visit (from the past 240 hours).  RADIOLOGY STUDIES/RESULTS: No results found.    LOS: 3  days   Kimberly Penna, MD  [facility]    To contact the attending provider between 7A-7P or the covering provider during after hours 7P-7A, please log into the web site [email or web address] and access using universal [place] password for that web site. If you do not have the password, please call the hospital operator.  04/21/2024, 11:14 AM

## 2024-04-21 NOTE — Progress Notes (Signed)
   04/21/24 0338  Assess: MEWS Score  Temp (!) 100.9 F (38.3 C)  BP 110/68  MAP (mmHg) 80  Pulse Rate (!) 119  ECG Heart Rate (!) 112  Resp (!) 21  Level of Consciousness Alert  SpO2 94 %  O2 Device Room Air  Assess: MEWS Score  MEWS Temp 1  MEWS Systolic 0  MEWS Pulse 2  MEWS RR 1  MEWS LOC 0  MEWS Score 4  MEWS Score Color Red  Assess: if the MEWS score is Yellow or Red  Were vital signs accurate and taken at a resting state? Yes  Does the patient meet 2 or more of the SIRS criteria? Yes  Does the patient have a confirmed or suspected source of infection? No  MEWS guidelines implemented  Yes, red  Treat  MEWS Interventions Considered administering scheduled or prn medications/treatments as ordered  Take Vital Signs  Increase Vital Sign Frequency  Red: Q1hr x2, continue Q4hrs until patient remains green for 12hrs  Escalate  MEWS: Escalate Red: Discuss with charge nurse and notify provider. Consider notifying RRT. If remains red for 2 hours consider need for higher level of care  Notify: Charge Nurse/RN  Name of Charge Nurse/RN Notified Landa Pine, RN  Provider Notification  Provider Name/Title Ulice Gamer, MD  Date Provider Notified 04/21/24  Time Provider Notified 0345  Method of Notification Page  Notification Reason Other (Comment) (RED MEWS)  Provider response See new orders  Date of Provider Response 04/21/24  Time of Provider Response 0340  Assess: SIRS CRITERIA  SIRS Temperature  0  SIRS Respirations  1  SIRS Pulse 1  SIRS WBC 0  SIRS Score Sum  2

## 2024-04-21 NOTE — Evaluation (Signed)
 Clinical/Bedside Swallow Evaluation Patient Details  Name: Tony Lopez MRN: [id number] Date of Birth: [birth year]  Today's Date: 04/21/2024 Time: SLP Start Time (ACUTE ONLY): 0844 SLP Stop Time (ACUTE ONLY): 0855 SLP Time Calculation (min) (ACUTE ONLY): 11 min  Past Medical History:  Past Medical History:  Diagnosis Date   Arthritis    GERD (gastroesophageal reflux disease)    Hepatitis C    Hypertension    Prostate cancer (HCC)    Past Surgical History:  Past Surgical History:  Procedure Laterality Date   BACK SURGERY  1999   CYSTOSCOPY WITH INJECTION N/A 10/05/2017   Procedure: CYSTOSCOPY WITH INJECTION INDOCYANINE GREEN DYE;  Surgeon: Osborn Blaze, MD;  Location: [facility];  Service: Urology;  Laterality: N/A;   KNEE SURGERY Left early 90s   CARTILAGE CLEAN OUT    LYMPHADENECTOMY Bilateral 10/05/2017   Procedure: PELVIC LYMPHADENECTOMY;  Surgeon: Osborn Blaze, MD;  Location: [facility];  Service: Urology;  Laterality: Bilateral;   ROBOT ASSISTED LAPAROSCOPIC RADICAL PROSTATECTOMY N/A 10/05/2017   Procedure: XI ROBOTIC ASSISTED LAPAROSCOPIC RADICAL PROSTATECTOMY;  Surgeon: Osborn Blaze, MD;  Location: [facility];  Service: Urology;  Laterality: N/A;   HPI:  Patient is a 73 y.o.  male with history of HTN, prostate cancer, GERD, EtOH use-who was referred from [facility] to ED for alcohol withdrawal symptoms    Assessment / Plan / Recommendation  Clinical Impression  Pt demonstrates significant delirium, cannot attend to PO. Sander Crooked responds to interactions, but is unintelligible. When offered PO pt does not recognize it. If water is touched to lips pt blows air with no improvement if cued. With total assisted sips of water pt orally held sip and then coughed. WIth puree he eventually swallowed. Overall pt is still not appropriate for oral intake due to mentation. Likely his swallowing function will be intact when he is alert. Unsure how long delirium will persist. May need to consider  alternate means of medication and nutrition if RN has struggled to give medication. He has been placed on a clear liquid diet by MD, but today he is not capable of taking it. WIll f/u for advancement as mentation improves. SLP Visit Diagnosis: Dysphagia, oral phase (R13.11)    Aspiration Risk  Risk for inadequate nutrition/hydration;Moderate aspiration risk    Diet Recommendation Alternative means - temporary    Medication Administration: Via alternative means    Other  Recommendations Oral Care Recommendations: Oral care QID    Recommendations for follow up therapy are one component of a multi-disciplinary discharge planning process, led by the attending physician.  Recommendations may be updated based on patient status, additional functional criteria and insurance authorization.  Follow up Recommendations Follow physician's recommendations for discharge plan and follow up therapies      Assistance Recommended at Discharge    Functional Status Assessment    Frequency and Duration min 2x/week  2 weeks       Prognosis Prognosis for improved oropharyngeal function: Good      Swallow Study   General HPI: Patient is a 73 y.o.  male with history of HTN, prostate cancer, GERD, EtOH use-who was referred from [facility] to ED for alcohol withdrawal symptoms Type of Study: Bedside Swallow Evaluation Previous Swallow Assessment: none Diet Prior to this Study: Thin liquids (Level 0) Temperature Spikes Noted: No Respiratory Status: Room air History of Recent Intubation: No Behavior/Cognition: Lethargic/Drowsy;Distractible Oral Cavity Assessment: Within Functional Limits Oral Care Completed by SLP: Yes Vision: Impaired for self-feeding Self-Feeding Abilities: Total assist Patient  Positioning: Upright in bed Baseline Vocal Quality: Low vocal intensity Volitional Cough: Cognitively unable to elicit Volitional Swallow: Unable to elicit    Oral/Motor/Sensory Function Overall Oral Motor/Sensory  Function: Within functional limits (doesnt f/c by no weakness appreciated)   Ice Chips Ice chips: Not tested   Thin Liquid Thin Liquid: Impaired Presentation: Straw;Cup Oral Phase Impairments: Poor awareness of bolus Oral Phase Functional Implications: Oral holding Pharyngeal  Phase Impairments: Cough - Immediate    Nectar Thick Nectar Thick Liquid: Not tested   Honey Thick Honey Thick Liquid: Not tested   Puree Puree: Impaired Presentation: Spoon Oral Phase Impairments: Poor awareness of bolus   Solid     Solid: Not tested      Laquincy Eastridge, Hardin Leys 04/21/2024,10:47 AM

## 2024-04-21 NOTE — Progress Notes (Signed)
-   RN reported that patient is running low-grade temperature.  However patient is too lethargic to give p.o. medication also have a rectal pouch unable to give rectal Tylenol .  Will add IV Tylenol  1000 mg however have to closely monitor hepatic function panel in the setting of elevated AST/ALT. -Partially sinus tachycardia in the setting of DVT.  Unable to get oral metoprolol.  Changed to IV metoprolol 5 mg every 8 hours.

## 2024-04-21 NOTE — Progress Notes (Signed)
 Low potassium and phosphate - Ordered IV potassium phosphate 30 mL.  Repeat BMP and Phos check in the afternoon.

## 2024-04-21 NOTE — Progress Notes (Signed)
 Lab called critical result: Phosphorus <1.0. S. Sundil, MD made aware.

## 2024-04-21 NOTE — Evaluation (Signed)
 Occupational Therapy Evaluation Patient Details Name: Tony Lopez MRN: [id number] DOB: 1951/05/22 Today's Date: 04/21/2024   History of Present Illness   Pt is a 73 y/o male presenting on 5/23 with EOTH withdrawal with DTs. Complicated by tachycardia, hyponatermia, and AKI. PMH includes: arthritis, Hep C, HTN, prostate cancer.     Clinical Impressions Patient admitted for above and presents with problem list below. Pt oriented to self, but perseverates on birthday when asked place; able to state month but not year. Pt currently requires +2 total assist for bed mobility, total assist to maintain upright at EOB, and max to total assist +2 for ADLs.  Pt unable to provide PLOF or home setup at this time.  Based on performance today, believe patient will best benefit from continued OT services acutely and after dc at inpatient setting with <3hrs/day to optimize independence, safety with ADLs and mobility.     If plan is discharge home, recommend the following:   Two people to help with walking and/or transfers;Two people to help with bathing/dressing/bathroom;Assistance with cooking/housework;Supervision due to cognitive status;Help with stairs or ramp for entrance;Assist for transportation;Direct supervision/assist for financial management;Direct supervision/assist for medications management;Assistance with feeding     Functional Status Assessment   Patient has had a recent decline in their functional status and demonstrates the ability to make significant improvements in function in a reasonable and predictable amount of time.     Equipment Recommendations   Other (comment) (defer)     Recommendations for Other Services         Precautions/Restrictions   Precautions Precautions: Fall Recall of Precautions/Restrictions: Impaired Restrictions Weight Bearing Restrictions Per Provider Order: No     Mobility Bed Mobility Overal bed mobility: Needs Assistance Bed  Mobility: Supine to Sit, Sit to Supine     Supine to sit: Total assist, +2 for physical assistance Sit to supine: Total assist, +2 for physical assistance   General bed mobility comments: total assist for modified helicopter to EOB using bed pads. Pt assisting with L LE towards EOB with max cueing.    Transfers                   General transfer comment: deferred due to level of arousal      Balance Overall balance assessment: Needs assistance Sitting-balance support: No upper extremity supported, Feet supported, Single extremity supported Sitting balance-Leahy Scale: Zero Sitting balance - Comments: total assist to maintain upright at EOB Postural control: Posterior lean, Left lateral lean                                 ADL either performed or assessed with clinical judgement   ADL Overall ADL's : Needs assistance/impaired     Grooming: Wash/dry face;Maximal assistance;Sitting Grooming Details (indicate cue type and reason): total assist to sit EOB, using R hand max assist for hand to face.                             Functional mobility during ADLs: Total assistance;+2 for physical assistance General ADL Comments: for all other self care total assist     Vision   Additional Comments: further assessment required- pt keeping eyes closed. able to open but only briefly     Perception         Praxis         Pertinent Vitals/Pain  Pain Assessment Pain Assessment: No/denies pain     Extremity/Trunk Assessment Upper Extremity Assessment Upper Extremity Assessment: Generalized weakness;Difficult to assess due to impaired cognition (pt able to raise UEs- R appears stronger than L but difficutl to assess)   Lower Extremity Assessment Lower Extremity Assessment: Defer to PT evaluation       Communication Communication Communication: Impaired Factors Affecting Communication: Reduced clarity of speech   Cognition Arousal:  Obtunded Behavior During Therapy: Flat affect Cognition: No family/caregiver present to determine baseline             OT - Cognition Comments: pt obtunded, able to answer some basic questions  and follow some simple 1 step commands.  Perseverates on his birthday after asked. Likely medication related.                 Following commands: Impaired Following commands impaired: Follows one step commands inconsistently, Follows one step commands with increased time     Cueing  General Comments   Cueing Techniques: Verbal cues;Tactile cues;Visual cues  VSS on RA, HR 110-120 during session   Exercises     Shoulder Instructions      Home Living Family/patient expects to be discharged to:: Private residence                                 Additional Comments: pt unable to report      Prior Functioning/Environment Prior Level of Function : Patient poor historian/Family not available                    OT Problem List: Decreased strength;Decreased range of motion;Decreased activity tolerance;Impaired balance (sitting and/or standing);Decreased cognition;Decreased safety awareness;Decreased coordination;Impaired vision/perception;Decreased knowledge of use of DME or AE;Decreased knowledge of precautions;Pain;Impaired UE functional use   OT Treatment/Interventions: Self-care/ADL training;Therapeutic exercise;DME and/or AE instruction;Therapeutic activities;Cognitive remediation/compensation;Patient/family education;Balance training      OT Goals(Current goals can be found in the care plan section)   Acute Rehab OT Goals Patient Stated Goal: none stated OT Goal Formulation: Patient unable to participate in goal setting Time For Goal Achievement: 05/05/24 Potential to Achieve Goals: Fair   OT Frequency:  Min 2X/week    Co-evaluation PT/OT/SLP Co-Evaluation/Treatment: Yes Reason for Co-Treatment: Necessary to address cognition/behavior during  functional activity;For patient/therapist safety;To address functional/ADL transfers   OT goals addressed during session: ADL's and self-care      AM-PAC OT "6 Clicks" Daily Activity     Outcome Measure Help from another person eating meals?: Total Help from another person taking care of personal grooming?: A Lot Help from another person toileting, which includes using toliet, bedpan, or urinal?: Total Help from another person bathing (including washing, rinsing, drying)?: Total Help from another person to put on and taking off regular upper body clothing?: Total Help from another person to put on and taking off regular lower body clothing?: Total 6 Click Score: 7   End of Session Nurse Communication: Mobility status  Activity Tolerance: Patient limited by lethargy Patient left: in bed;with call bell/phone within reach;with bed alarm set;with restraints reapplied;with SCD's reapplied  OT Visit Diagnosis: Other abnormalities of gait and mobility (R26.89);Muscle weakness (generalized) (M62.81);Other symptoms and signs involving cognitive function                Time: 4098-1191 OT Time Calculation (min): 20 min Charges:  OT General Charges $OT Visit: 1 Visit OT Evaluation $OT Eval Moderate Complexity: 1  Mod  Bary Boss, OT Acute Rehabilitation Services Office [phone] Secure Chat Preferred    Fredrich Jefferson 04/21/2024, 12:08 PM

## 2024-04-21 NOTE — Evaluation (Signed)
 Physical Therapy Evaluation Patient Details Name: Tony Lopez MRN: [id number] DOB: October 05, 1951 Today's Date: 04/21/2024  History of Present Illness  Pt is a 73 y/o male presenting on 5/23 with ETOH withdrawal with DTs. Complicated by tachycardia, hyponatermia, and AKI. PMH includes: arthritis, Hep C, HTN, prostate cancer.  Clinical Impression  Pt admitted with above diagnosis. Pt unable to provide history due to decreased level of arousal/cognitive status.  Per chart review pt was from home, working until the past few months, and initially presented to behavioral health.  Unsure of exact PLOF but would seem that pt was likely mobile and able to walk at baseline.  Today, pt obtunded and only opens eyes and responds briefly.  Required total A x 2 for transfer to EOB and total A to maintain balance despite attempt to improve balance.   Would expect pt to make good improvements as cognition improves.  Pt currently with functional limitations due to the deficits listed below (see PT Problem List). Pt will benefit from acute skilled PT to increase their independence and safety with mobility to allow discharge.  Do recommend Patient will benefit from continued inpatient follow up therapy, <3 hours/day at d/c.          If plan is discharge home, recommend the following: Two people to help with walking and/or transfers;Two people to help with bathing/dressing/bathroom   Can travel by private vehicle   No    Equipment Recommendations Hospital bed;Hoyer lift;Wheelchair cushion (measurements PT);Wheelchair (measurements PT) (needs ongoing assessment)  Recommendations for Other Services       Functional Status Assessment Patient has had a recent decline in their functional status and demonstrates the ability to make significant improvements in function in a reasonable and predictable amount of time.     Precautions / Restrictions Precautions Precautions: Fall      Mobility  Bed  Mobility Overal bed mobility: Needs Assistance Bed Mobility: Supine to Sit, Sit to Supine     Supine to sit: Total assist, +2 for physical assistance Sit to supine: Total assist, +2 for physical assistance   General bed mobility comments: total assist for modified helicopter to EOB using bed pads. Pt assisting with L LE towards EOB with max cueing.    Transfers                   General transfer comment: deferred due to level of arousal    Ambulation/Gait                  Stairs            Wheelchair Mobility     Tilt Bed    Modified Rankin (Stroke Patients Only)       Balance Overall balance assessment: Needs assistance Sitting-balance support: No upper extremity supported, Feet supported, Single extremity supported Sitting balance-Leahy Scale: Zero Sitting balance - Comments: total assist to maintain upright at EOB - sat at least 5 mins with total assist - tried cues to reach forward for "high five", hands on knees, and backing off on assist to see if pt would sit, but unable Postural control: Posterior lean, Left lateral lean                                   Pertinent Vitals/Pain Pain Assessment Pain Assessment: No/denies pain    Home Living Family/patient expects to be discharged to:: Private residence  Additional Comments: pt unable to report    Prior Function Prior Level of Function : Patient poor historian/Family not available                     Extremity/Trunk Assessment   Upper Extremity Assessment Upper Extremity Assessment: Defer to OT evaluation    Lower Extremity Assessment Lower Extremity Assessment: Generalized weakness;Difficult to assess due to impaired cognition (Pt did assist minimally to move legs of bed and partial kicks at EOB.)    Cervical / Trunk Assessment Cervical / Trunk Assessment: Kyphotic  Communication   Communication Communication: Impaired Factors  Affecting Communication: Reduced clarity of speech    Cognition Arousal: Obtunded Behavior During Therapy: Flat affect   PT - Cognitive impairments: Difficult to assess Difficult to assess due to: Level of arousal                     PT - Cognition Comments: Pt obtunded.  He was able to state name and birthday but perserverated on birthday when asked location.  Pt only briefly holds eyes opens.  Does follow some simple commands with increased time, multimodal cues Following commands: Impaired Following commands impaired: Follows one step commands inconsistently, Follows one step commands with increased time     Cueing Cueing Techniques: Verbal cues, Tactile cues, Visual cues     General Comments General comments (skin integrity, edema, etc.): VSS on RA, HR 110-120 during session  Noting in MD note 4 am 5/26: "Partially sinus tachycardia in the setting of DVT " - clarified with MD today (Dr Hilton Lucky) and reports pt with no DVT and ok for therapy.     Exercises     Assessment/Plan    PT Assessment Patient needs continued PT services  PT Problem List Decreased strength;Decreased range of motion;Decreased cognition;Decreased activity tolerance;Decreased balance;Decreased mobility;Decreased knowledge of use of DME;Decreased safety awareness       PT Treatment Interventions DME instruction;Therapeutic exercise;Gait training;Balance training;Neuromuscular re-education;Functional mobility training;Therapeutic activities;Cognitive remediation;Manual techniques    PT Goals (Current goals can be found in the Care Plan section)  Acute Rehab PT Goals Patient Stated Goal: unable to state PT Goal Formulation: Patient unable to participate in goal setting Time For Goal Achievement: 05/05/24 Potential to Achieve Goals: Good    Frequency Min 2X/week     Co-evaluation PT/OT/SLP Co-Evaluation/Treatment: Yes Reason for Co-Treatment: Necessary to address cognition/behavior during  functional activity;For patient/therapist safety;To address functional/ADL transfers PT goals addressed during session: Mobility/safety with mobility;Balance OT goals addressed during session: ADL's and self-care       AM-PAC PT "6 Clicks" Mobility  Outcome Measure Help needed turning from your back to your side while in a flat bed without using bedrails?: Total Help needed moving from lying on your back to sitting on the side of a flat bed without using bedrails?: Total Help needed moving to and from a bed to a chair (including a wheelchair)?: Total Help needed standing up from a chair using your arms (e.g., wheelchair or bedside chair)?: Total Help needed to walk in hospital room?: Total Help needed climbing 3-5 steps with a railing? : Total 6 Click Score: 6    End of Session   Activity Tolerance: Patient limited by lethargy Patient left: in bed;with call bell/phone within reach;with bed alarm set;with SCD's reapplied;Other (comment) (heels floating) Nurse Communication: Mobility status PT Visit Diagnosis: Muscle weakness (generalized) (M62.81);Other abnormalities of gait and mobility (R26.89)    Time: 1610-9604 PT Time Calculation (min) (  ACUTE ONLY): 19 min   Charges:   PT Evaluation $PT Eval Low Complexity: 1 Low   PT General Charges $$ ACUTE PT VISIT: 1 Visit         Cyd Dowse, PT Acute Rehab [facility] [phone]   Carolynn Citrin 04/21/2024, 12:37 PM

## 2024-04-21 NOTE — Plan of Care (Signed)
  Problem: Education: Goal: Knowledge of General Education information will improve Description: Including pain rating scale, medication(s)/side effects and non-pharmacologic comfort measures Outcome: Progressing   Problem: Health Behavior/Discharge Planning: Goal: Ability to manage health-related needs will improve Outcome: Progressing   Problem: Clinical Measurements: Goal: Ability to maintain clinical measurements within normal limits will improve Outcome: Progressing Goal: Will remain free from infection Outcome: Progressing Goal: Respiratory complications will improve Outcome: Progressing Goal: Cardiovascular complication will be avoided Outcome: Progressing   Problem: Activity: Goal: Risk for activity intolerance will decrease Outcome: Progressing   Problem: Nutrition: Goal: Adequate nutrition will be maintained Outcome: Progressing   Problem: Coping: Goal: Level of anxiety will decrease Outcome: Progressing   Problem: Safety: Goal: Ability to remain free from injury will improve Outcome: Progressing

## 2024-04-22 ENCOUNTER — Inpatient Hospital Stay (HOSPITAL_COMMUNITY)

## 2024-04-22 DIAGNOSIS — N179 Acute kidney failure, unspecified: Secondary | ICD-10-CM | POA: Diagnosis not present

## 2024-04-22 DIAGNOSIS — K922 Gastrointestinal hemorrhage, unspecified: Secondary | ICD-10-CM | POA: Diagnosis not present

## 2024-04-22 DIAGNOSIS — K7010 Alcoholic hepatitis without ascites: Secondary | ICD-10-CM | POA: Diagnosis not present

## 2024-04-22 DIAGNOSIS — F10931 Alcohol use, unspecified with withdrawal delirium: Secondary | ICD-10-CM | POA: Diagnosis not present

## 2024-04-22 LAB — CBC
HCT: 24.2 % — ABNORMAL LOW (ref 39.0–52.0)
Hemoglobin: 8.4 g/dL — ABNORMAL LOW (ref 13.0–17.0)
MCH: 33.2 pg (ref 26.0–34.0)
MCHC: 34.7 g/dL (ref 30.0–36.0)
MCV: 95.7 fL (ref 80.0–100.0)
Platelets: 122 10*3/uL — ABNORMAL LOW (ref 150–400)
RBC: 2.53 MIL/uL — ABNORMAL LOW (ref 4.22–5.81)
RDW: 14.1 % (ref 11.5–15.5)
WBC: 10.3 10*3/uL (ref 4.0–10.5)
nRBC: 0.6 % — ABNORMAL HIGH (ref 0.0–0.2)

## 2024-04-22 LAB — COMPREHENSIVE METABOLIC PANEL WITH GFR
ALT: 54 U/L — ABNORMAL HIGH (ref 0–44)
AST: 64 U/L — ABNORMAL HIGH (ref 15–41)
Albumin: 2.2 g/dL — ABNORMAL LOW (ref 3.5–5.0)
Alkaline Phosphatase: 118 U/L (ref 38–126)
Anion gap: 9 (ref 5–15)
BUN: 14 mg/dL (ref 8–23)
CO2: 25 mmol/L (ref 22–32)
Calcium: 7.8 mg/dL — ABNORMAL LOW (ref 8.9–10.3)
Chloride: 101 mmol/L (ref 98–111)
Creatinine, Ser: 0.71 mg/dL (ref 0.61–1.24)
GFR, Estimated: >60 mL/min (ref >60)
Glucose, Bld: 114 mg/dL — ABNORMAL HIGH (ref 70–99)
Potassium: 3.2 mmol/L — ABNORMAL LOW (ref 3.5–5.1)
Sodium: 135 mmol/L (ref 135–145)
Total Bilirubin: 2.5 mg/dL — ABNORMAL HIGH (ref 0.0–1.2)
Total Protein: 4.9 g/dL — ABNORMAL LOW (ref 6.5–8.1)

## 2024-04-22 LAB — BLOOD CULTURE ID PANEL (REFLEXED) - BCID2
A.calcoaceticus-baumannii: NOT DETECTED
Bacteroides fragilis: NOT DETECTED
CTX-M ESBL: NOT DETECTED
Candida albicans: NOT DETECTED
Candida auris: NOT DETECTED
Candida glabrata: NOT DETECTED
Candida krusei: NOT DETECTED
Candida parapsilosis: NOT DETECTED
Candida tropicalis: NOT DETECTED
Carbapenem resist OXA 48 LIKE: NOT DETECTED
Carbapenem resistance IMP: NOT DETECTED
Carbapenem resistance KPC: NOT DETECTED
Carbapenem resistance NDM: NOT DETECTED
Carbapenem resistance VIM: NOT DETECTED
Cryptococcus neoformans/gattii: NOT DETECTED
Enterobacter cloacae complex: NOT DETECTED
Enterobacterales: DETECTED — AB
Enterococcus Faecium: NOT DETECTED
Enterococcus faecalis: NOT DETECTED
Escherichia coli: DETECTED — AB
Haemophilus influenzae: NOT DETECTED
Klebsiella aerogenes: NOT DETECTED
Klebsiella oxytoca: NOT DETECTED
Klebsiella pneumoniae: NOT DETECTED
Listeria monocytogenes: NOT DETECTED
Methicillin resistance mecA/C: NOT DETECTED
Neisseria meningitidis: NOT DETECTED
Proteus species: NOT DETECTED
Pseudomonas aeruginosa: NOT DETECTED
Salmonella species: NOT DETECTED
Serratia marcescens: NOT DETECTED
Staphylococcus aureus (BCID): NOT DETECTED
Staphylococcus epidermidis: DETECTED — AB
Staphylococcus lugdunensis: NOT DETECTED
Staphylococcus species: DETECTED — AB
Stenotrophomonas maltophilia: NOT DETECTED
Streptococcus agalactiae: NOT DETECTED
Streptococcus pneumoniae: NOT DETECTED
Streptococcus pyogenes: NOT DETECTED
Streptococcus species: NOT DETECTED

## 2024-04-22 LAB — GLUCOSE, CAPILLARY: Glucose-Capillary: 105 mg/dL — ABNORMAL HIGH (ref 70–99)

## 2024-04-22 LAB — MAGNESIUM: Magnesium: 1.6 mg/dL — ABNORMAL LOW (ref 1.7–2.4)

## 2024-04-22 MED ORDER — POTASSIUM PHOSPHATES 15 MMOLE/5ML IV SOLN
30.0000 mmol | Freq: Once | INTRAVENOUS | Status: AC
  Administered 2024-04-22: 30 mmol via INTRAVENOUS
  Filled 2024-04-22: qty 10

## 2024-04-22 MED ORDER — POTASSIUM CHLORIDE 10 MEQ/100ML IV SOLN
10.0000 meq | INTRAVENOUS | Status: AC
  Administered 2024-04-22 (×3): 10 meq via INTRAVENOUS
  Filled 2024-04-22 (×2): qty 100

## 2024-04-22 MED ORDER — MAGNESIUM SULFATE 4 GM/100ML IV SOLN
4.0000 g | Freq: Once | INTRAVENOUS | Status: AC
  Administered 2024-04-22: 4 g via INTRAVENOUS
  Filled 2024-04-22: qty 100

## 2024-04-22 MED ORDER — SODIUM CHLORIDE 0.9 % IV SOLN
2.0000 g | INTRAVENOUS | Status: DC
  Administered 2024-04-22 – 2024-04-23 (×2): 2 g via INTRAVENOUS
  Filled 2024-04-22 (×2): qty 20

## 2024-04-22 NOTE — Plan of Care (Signed)

## 2024-04-22 NOTE — Progress Notes (Signed)
 SLP Cancellation Note  Patient Details Name: Tony Lopez MRN: [id number] DOB: 07/06/1951   Cancelled treatment:       Reason Eval/Treat Not Completed: Patient at procedure or test/unavailable. Attempted  Session, MD reports pt to stay NPO until result from abdominal US .   Fatime Biswell, Hardin Leys 04/22/2024, 3:01 PM

## 2024-04-22 NOTE — Progress Notes (Signed)
 PHARMACY - PHYSICIAN COMMUNICATION CRITICAL VALUE ALERT - BLOOD CULTURE IDENTIFICATION (BCID)  Tony Lopez is an 73 y.o. male who presented to [facility] on 04/18/[facility]24 with alcohol withdrawal symptoms. Patient had low grade fevers and has been being monitored off antibiotics  Assessment:  1/2 sets with anaerobic bottle growing staph epi/E. Coli (no Resistance) and anaerobic bottle with E. Coli (no R)  Name of physician (or Provider) Contacted: Dr. Sundil  Current antibiotics: None  Changes to prescribed antibiotics recommended:   -Add Ceftriaxone  2g IV every 24 hours   Results for orders placed or performed during the hospital encounter of 04/18/24  Blood Culture ID Panel (Reflexed) (Collected: 04/21/2024 12:02 PM)  Result Value Ref Range   Enterococcus faecalis NOT DETECTED NOT DETECTED   Enterococcus Faecium NOT DETECTED NOT DETECTED   Listeria monocytogenes NOT DETECTED NOT DETECTED   Staphylococcus species DETECTED (A) NOT DETECTED   Staphylococcus aureus (BCID) NOT DETECTED NOT DETECTED   Staphylococcus epidermidis DETECTED (A) NOT DETECTED   Staphylococcus lugdunensis NOT DETECTED NOT DETECTED   Streptococcus species NOT DETECTED NOT DETECTED   Streptococcus agalactiae NOT DETECTED NOT DETECTED   Streptococcus pneumoniae NOT DETECTED NOT DETECTED   Streptococcus pyogenes NOT DETECTED NOT DETECTED   A.calcoaceticus-baumannii NOT DETECTED NOT DETECTED   Bacteroides fragilis NOT DETECTED NOT DETECTED   Enterobacterales DETECTED (A) NOT DETECTED   Enterobacter cloacae complex NOT DETECTED NOT DETECTED   Escherichia coli DETECTED (A) NOT DETECTED   Klebsiella aerogenes NOT DETECTED NOT DETECTED   Klebsiella oxytoca NOT DETECTED NOT DETECTED   Klebsiella pneumoniae NOT DETECTED NOT DETECTED   Proteus species NOT DETECTED NOT DETECTED   Salmonella species NOT DETECTED NOT DETECTED   Serratia marcescens NOT DETECTED NOT DETECTED   Haemophilus influenzae NOT DETECTED NOT  DETECTED   Neisseria meningitidis NOT DETECTED NOT DETECTED   Pseudomonas aeruginosa NOT DETECTED NOT DETECTED   Stenotrophomonas maltophilia NOT DETECTED NOT DETECTED   Candida albicans NOT DETECTED NOT DETECTED   Candida auris NOT DETECTED NOT DETECTED   Candida glabrata NOT DETECTED NOT DETECTED   Candida krusei NOT DETECTED NOT DETECTED   Candida parapsilosis NOT DETECTED NOT DETECTED   Candida tropicalis NOT DETECTED NOT DETECTED   Cryptococcus neoformans/gattii NOT DETECTED NOT DETECTED   CTX-M ESBL NOT DETECTED NOT DETECTED   Carbapenem resistance IMP NOT DETECTED NOT DETECTED   Carbapenem resistance KPC NOT DETECTED NOT DETECTED   Methicillin resistance mecA/C NOT DETECTED NOT DETECTED   Carbapenem resistance NDM NOT DETECTED NOT DETECTED   Carbapenem resist OXA 48 LIKE NOT DETECTED NOT DETECTED   Carbapenem resistance VIM NOT DETECTED NOT DETECTED    Young Hensen, PharmD, BCPS Clinical Pharmacist 04/22/2024 3:28 AM

## 2024-04-22 NOTE — TOC Initial Note (Signed)
 Transition of Care [facility]) - Initial/Assessment Note    Patient Details  Name: Tony Lopez MRN: [id number] Date of Birth: [birth year]  Transition [facility] CM/SW Contact:    Jannice Mends, LCSW Phone Number: 04/22/2024, 12:10 PM  Clinical Narrative:                 Patient admitted from [facility] for alcohol cessation. CSW following for disposition determination. CIWA will need to be 1 or below for SNF to consider.     Barriers to Discharge: Continued Medical Work up, English as a second language teacher, SNF Pending bed offer   Patient Goals and CMS Choice            Expected Discharge Plan and Services In-house Referral: Clinical Social Work     Living arrangements for the past 2 months: Single Family Home                                      Prior Living Arrangements/Services Living arrangements for the past 2 months: Single Family Home Lives with:: Self Patient language and need for interpreter reviewed:: Yes Do you feel safe going back to the place where you live?: Yes      Need for Family Participation in Patient Care: Yes (Comment) Care giver support system in place?: Yes (comment)   Criminal Activity/Legal Involvement Pertinent to Current Situation/Hospitalization: No - Comment as needed  Activities of Daily Living      Permission Sought/Granted Permission sought to share information with : Facility Medical sales representative                Emotional Assessment Appearance:: Appears stated age Attitude/Demeanor/Rapport: Unable to Assess Affect (typically observed): Unable to Assess Orientation: : Oriented to Self, Oriented to Place Alcohol / Substance Use: Alcohol Use Psych Involvement: No (comment)  Admission diagnosis:  Alcohol withdrawal (HCC) [F10.939] Patient Active Problem List   Diagnosis Date Noted   Alcohol withdrawal (HCC) 04/18/2024   Alcoholic hepatitis 04/18/2024   Hyponatremia 04/18/2024   AKI (acute kidney injury) (HCC) 04/18/2024    GI bleed 04/18/2024   Hepatitis C antibody positive in blood 02/26/2018   Prostate cancer (HCC) 10/05/2017   Prostatic adenocarcinoma (HCC) 04/30/2017   PCP:  Diamond Formica, PA Pharmacy:   [facility] [id number] - [place], [place] - [address] [place]ette [place]le [place] [address] Phone: [phone] Fax: [phone]     Social Drivers of Health (SDOH) Social History: SDOH Screenings   Food Insecurity: No Food Insecurity (04/19/2024)  Housing: Low Risk  (04/19/2024)  Transportation Needs: No Transportation Needs (04/19/2024)  Utilities: Not At Risk (04/19/2024)  Tobacco Use: High Risk (04/18/2024)   SDOH Interventions:     Readmission Risk Interventions     No data to display

## 2024-04-22 NOTE — Progress Notes (Signed)
 PROGRESS NOTE        PATIENT DETAILS Name: Tony Lopez Age: 73 y.o. Sex: male Date of Birth: 12/11/1950 Admit Date: 04/18/2024 Admitting Physician [name], MD [id number], [name], [name]  Brief Summary: Patient is a 73 y.o.  male with history of HTN, prostate cancer, GERD, EtOH use-who was referred from [facility] to ED for alcohol withdrawal symptoms.  Significant events: 5/23>> admit to [facility]. 5/26>>Fever-blood culture- gm neg rod/gm +ve rod  Significant studies: 5/23>> CT head: No acute intracranial abnormality 5/23>> CT abdomen/pelvis: No acute abnormality. 5/24>> RUQ ultrasound: Biliary sludge/small gallstones.  No evidence of acute cholecystitis.  Significant microbiology data: 5/25>> GI pathogen panel: Negative 5/26>> blood culture: Gram-positive cocci/gram-negative rod (BCID staph epi/E. coli-suspect staph epi is a contamination)  Procedures: None  Consults: GI  Subjective: Much more awake and alert compared to the past few days.  Winces when I press on his right upper abdomen.  Requesting his mittens be removed  Objective: Vitals: Blood pressure 122/78, pulse 91, temperature (!) 97.1 F (36.2 C), temperature source Oral, resp. rate (!) 24, height 6' (1.829 m), weight 93 kg, SpO2 96%.   Exam: Awake/alert-significantly less lethargic compared to yesterday Chest: Clear to auscultation CVS: S1-S2 regular Abdomen: Appears to be tender with guarding in the RUQ area. Moving all 4 extremities-but difficult exam. No edema   Pertinent Labs/Radiology:    Latest Ref Rng & Units 04/22/2024    3:29 AM 04/21/2024    3:32 AM 04/20/2024    6:46 PM  CBC  WBC 4.0 - 10.5 K/uL 10.3  10.1  9.6   Hemoglobin 13.0 - 17.0 g/dL 8.4  8.6  9.7   Hematocrit 39.0 - 52.0 % 24.2  24.3  27.1   Platelets 150 - 400 K/uL 122  95  87     Lab Results  Component Value Date   NA 135 04/22/2024   K 3.2 (L) 04/22/2024   CL 101 04/22/2024   CO2 25 04/22/2024       Assessment/Plan: EtOH withdrawal with DTs Per psych NP documentation-last drink was on 5/22 (1 quart of whiskey will last every 2 days)  Much improved today-less lethargic-much more awake and alert Hardly any tachycardia Remains on Ativan per CIWA He should be in the last stages of his EtOH withdrawal symptoms at this point.  Sinus tachycardia with PACs Secondary to DTs Tachycardia has essentially resolved-with treatment of underlying DTs-and with initiation of low-dose beta-blockers.  Gram-negative bacteremia Fever on 5/26-afebrile overnight Has RUQ tenderness on exam today-concerned that he may be developing acute calculus cholecystitis.  Did have some diarrhea over the weekend that has since resolved.  UA really not very consistent with UTI. Keep n.p.o.-repeat RUQ ultrasound-General Surgery consulted Already on Rocephin.  AKI Hemodynamically mediated-poor oral intake/use of lisinopril /HCTZ Improving with IVF  Hyponatremia Secondary to dehydration/poor oral intake/HCTZ use Resolved.  Diarrhea Prn immodium GI pathogen panel pending  Elevated lipase level Initially not felt to have abdominal pain-no evidence of pancreatitis on CT However on 5/27-RUQ pain-see above regarding plans for repeat RUQ ultrasound given gram-negative bacteremia.    EtOH hepatitis Supportive care Per GI note-no indication for steroid as discriminant function I<10.  Thrombocytopenia Secondary to EtOH use Continues to have mild thrombocytopenia-but this slowly improving. No evidence of bleeding  Normocytic anemia Probably related to EtOH related bone marrow suppression No  evidence of acute blood loss-although FOBT positive Trend CBC for now.  Guaiac positive stool Outpatient GI follow-up-as no evidence of active bleeding.  Hb stable.  Cholelithiasis Initially felt to be asymptomatic-but now having fever and RUQ tenderness-see above.  1.6 x 2.4 cm structure arising from left  kidney Incidental finding on CT abdomen. Per radiology-favored to represent proteinaceous/hemorrhagic cyst Will need outpatient imaging postdischarge.  BMI: Estimated body mass index is 27.81 kg/m as calculated from the following:   Height as of this encounter: 6' (1.829 m).   Weight as of this encounter: 93 kg.   Code status:   Code Status: Full Code   DVT Prophylaxis: SCDs Start: 04/18/24 2024   Family Communication: Ex wife-Nikki Cragg [phone] updated over the phone 5/27   Disposition Plan: Status is: Inpatient Remains inpatient appropriate because: Severity of illness   Planned Discharge Destination:Home health   Diet: Diet Order             Diet NPO time specified Except for: Ice Chips, Sips with Meds  Diet effective now                     Antimicrobial agents: Anti-infectives (From admission, onward)    Start     Dose/Rate Route Frequency Ordered Stop   04/22/24 0400  cefTRIAXone (ROCEPHIN) 2 g in sodium chloride  0.9 % 100 mL IVPB        2 g 200 mL/hr over 30 Minutes Intravenous Every 24 hours 04/22/24 0333          MEDICATIONS: Scheduled Meds:  allopurinol  100 mg Oral Daily   folic acid  1 mg Oral Daily   lidocaine   1 patch Transdermal Q24H   LORazepam  0-4 mg Intravenous Q8H   metoprolol tartrate  5 mg Intravenous Q8H   multivitamin with minerals  1 tablet Oral Daily   nicotine  21 mg Transdermal Daily   pantoprazole (PROTONIX) IV  40 mg Intravenous Q12H   thiamine  100 mg Oral Daily   Or   thiamine  100 mg Intravenous Daily   Continuous Infusions:  cefTRIAXone (ROCEPHIN)  IV Stopped (04/22/24 0518)   lactated ringers  75 mL/hr at 04/22/24 0834   magnesium  sulfate bolus IVPB 4 g (04/22/24 0840)   potassium chloride     potassium PHOSPHATE IVPB (in mmol) 30 mmol (04/22/24 0848)   PRN Meds:.loperamide, LORazepam, ondansetron  **OR** ondansetron  (ZOFRAN ) IV   I have personally reviewed following labs and imaging  studies  LABORATORY DATA: CBC: Recent Labs  Lab 04/18/24 1354 04/18/24 2128 04/19/24 1426 04/20/24 0528 04/20/24 1846 04/21/24 0332 04/22/24 0329  WBC 10.7*   < > 9.3 11.5* 9.6 10.1 10.3  NEUTROABS 9.1*  --   --   --   --   --   --   HGB 11.1*   < > 9.9* 9.3* 9.7* 8.6* 8.4*  HCT 31.3*   < > 27.2* 26.6* 27.1* 24.3* 24.2*  MCV 94.8   < > 93.8 93.0 94.1 94.2 95.7  PLT 66*   < > 54* 65* 87* 95* 122*   < > = values in this interval not displayed.    Basic Metabolic Panel: Recent Labs  Lab 04/19/24 0217 04/20/24 0528 04/21/24 0332 04/21/24 1343 04/22/24 0329  NA 128* 132* 133* 134* 135  K 3.4* 3.2* 3.0* 3.7 3.2*  CL 90* 92* 96* 98 101  CO2 17* 23 26 26 25   GLUCOSE 84 111* 138* 152* 114*  BUN  50* 25* 21 18 14   CREATININE 2.06* 1.25* 0.90 0.80 0.71  CALCIUM 8.3* 8.6* 8.4* 8.2* 7.8*  MG  --  1.4* 1.8  --  1.6*  PHOS  --   --  <1.0* 2.0*  --     GFR: Estimated Creatinine Clearance: 93 mL/min (by C-G formula based on SCr of 0.71 mg/dL).  Liver Function Tests: Recent Labs  Lab 04/18/24 1354 04/19/24 0217 04/20/24 0528 04/21/24 0332 04/22/24 0329  AST 254* 178* 128* 82* 64*  ALT 139* 103* 88* 65* 54*  ALKPHOS 144* 123 123 109 118  BILITOT 6.0* 5.5* 4.6* 3.1* 2.5*  PROT 6.1* 5.0* 5.4* 5.0* 4.9*  ALBUMIN 3.3* 2.7* 2.7* 2.4* 2.2*   Recent Labs  Lab 04/18/24 1354 04/20/24 0528  LIPASE 253* 266*   No results for input(s): "AMMONIA" in the last 168 hours.  Coagulation Profile: Recent Labs  Lab 04/18/24 1437  INR 0.9    Cardiac Enzymes: No results for input(s): "CKTOTAL", "CKMB", "CKMBINDEX", "TROPONINI" in the last 168 hours.  BNP (last 3 results) No results for input(s): "PROBNP" in the last 8760 hours.  Lipid Profile: No results for input(s): "CHOL", "HDL", "LDLCALC", "TRIG", "CHOLHDL", "LDLDIRECT" in the last 72 hours.  Thyroid Function Tests: No results for input(s): "TSH", "T4TOTAL", "FREET4", "T3FREE", "THYROIDAB" in the last 72 hours.  Anemia  Panel: No results for input(s): "VITAMINB12", "FOLATE", "FERRITIN", "TIBC", "IRON", "RETICCTPCT" in the last 72 hours.  Urine analysis:    Component Value Date/Time   COLORURINE AMBER (A) 04/21/2024 1114   APPEARANCEUR CLOUDY (A) 04/21/2024 1114   LABSPEC 1.027 04/21/2024 1114   PHURINE 5.0 04/21/2024 1114   GLUCOSEU NEGATIVE 04/21/2024 1114   HGBUR MODERATE (A) 04/21/2024 1114   BILIRUBINUR NEGATIVE 04/21/2024 1114   KETONESUR 5 (A) 04/21/2024 1114   PROTEINUR 30 (A) 04/21/2024 1114   NITRITE NEGATIVE 04/21/2024 1114   LEUKOCYTESUR NEGATIVE 04/21/2024 1114    Sepsis Labs: Lactic Acid, Venous No results found for: "LATICACIDVEN"  MICROBIOLOGY: Recent Results (from the past 240 hours)  Gastrointestinal Panel by PCR , Stool     Status: None   Collection Time: 04/20/24  6:39 PM   Specimen: Stool  Result Value Ref Range Status   Campylobacter species NOT DETECTED NOT DETECTED Final   Plesimonas shigelloides NOT DETECTED NOT DETECTED Final   Salmonella species NOT DETECTED NOT DETECTED Final   Yersinia enterocolitica NOT DETECTED NOT DETECTED Final   Vibrio species NOT DETECTED NOT DETECTED Final   Vibrio cholerae NOT DETECTED NOT DETECTED Final   Enteroaggregative E coli (EAEC) NOT DETECTED NOT DETECTED Final   Enteropathogenic E coli (EPEC) NOT DETECTED NOT DETECTED Final   Enterotoxigenic E coli (ETEC) NOT DETECTED NOT DETECTED Final   Shiga like toxin producing E coli (STEC) NOT DETECTED NOT DETECTED Final   Shigella/Enteroinvasive E coli (EIEC) NOT DETECTED NOT DETECTED Final   Cryptosporidium NOT DETECTED NOT DETECTED Final   Cyclospora cayetanensis NOT DETECTED NOT DETECTED Final   Entamoeba histolytica NOT DETECTED NOT DETECTED Final   Giardia lamblia NOT DETECTED NOT DETECTED Final   Adenovirus F40/41 NOT DETECTED NOT DETECTED Final   Astrovirus NOT DETECTED NOT DETECTED Final   Norovirus GI/GII NOT DETECTED NOT DETECTED Final   Rotavirus A NOT DETECTED NOT DETECTED  Final   Sapovirus (I, II, IV, and V) NOT DETECTED NOT DETECTED Final    Comment: Performed at [facility], [address] [place], [place] [address]  Culture, blood (Routine X 2) w Reflex  to ID Panel     Status: None (Preliminary result)   Collection Time: 04/21/24 12:02 PM   Specimen: BLOOD LEFT HAND  Result Value Ref Range Status   Specimen Description BLOOD LEFT HAND  Final   Special Requests   Final    BOTTLES DRAWN AEROBIC AND ANAEROBIC Blood Culture adequate volume   Culture  Setup Time   Final    GRAM NEGATIVE RODS GRAM POSITIVE COCCI ANAEROBIC BOTTLE ONLY GRAM NEGATIVE RODS AEROBIC BOTTLE ONLY CRITICAL RESULT CALLED TO, READ BACK BY AND VERIFIED WITH: J [facility]  04/22/24 MK Performed at [facility], [address] [place], [place] [address]    Culture GRAM NEGATIVE RODS GRAM POSITIVE COCCI   Final   Report Status PENDING  Incomplete  Culture, blood (Routine X 2) w Reflex to ID Panel     Status: None (Preliminary result)   Collection Time: 04/21/24 12:02 PM   Specimen: BLOOD RIGHT ARM  Result Value Ref Range Status   Specimen Description BLOOD RIGHT ARM  Final   Special Requests   Final    BOTTLES DRAWN AEROBIC ONLY Blood Culture adequate volume   Culture  Setup Time   Final    GRAM NEGATIVE RODS GRAM POSITIVE COCCI AEROBIC BOTTLE ONLY Performed at [facility], [address], [place], [place] [address]    Culture Hillis Lu NEGATIVE RODS GRAM POSITIVE COCCI   Final   Report Status PENDING  Incomplete  Blood Culture ID Panel (Reflexed)     Status: Abnormal   Collection Time: 04/21/24 12:02 PM  Result Value Ref Range Status   Enterococcus faecalis NOT DETECTED NOT DETECTED Final   Enterococcus Faecium NOT DETECTED NOT DETECTED Final   Listeria monocytogenes NOT DETECTED NOT DETECTED Final   Staphylococcus species DETECTED (A) NOT DETECTED Final    Comment: CRITICAL RESULT CALLED TO, READ BACK BY AND VERIFIED WITH: J  WYLAND,PHARMD@[id number]  04/22/24 MK    Staphylococcus aureus (BCID) NOT DETECTED NOT DETECTED Final   Staphylococcus epidermidis DETECTED (A) NOT DETECTED Final    Comment: CRITICAL RESULT CALLED TO, READ BACK BY AND VERIFIED WITH: J WYLAND,PHARMD@[id number]  04/22/24 MK    Staphylococcus lugdunensis NOT DETECTED NOT DETECTED Final   Streptococcus species NOT DETECTED NOT DETECTED Final   Streptococcus agalactiae NOT DETECTED NOT DETECTED Final   Streptococcus pneumoniae NOT DETECTED NOT DETECTED Final   Streptococcus pyogenes NOT DETECTED NOT DETECTED Final   A.calcoaceticus-baumannii NOT DETECTED NOT DETECTED Final   Bacteroides fragilis NOT DETECTED NOT DETECTED Final   Enterobacterales DETECTED (A) NOT DETECTED Final    Comment: Enterobacterales represent a large order of gram negative bacteria, not a single organism. CRITICAL RESULT CALLED TO, READ BACK BY AND VERIFIED WITH: J WYLAND,PHARMD@0319  04/22/24 MK    Enterobacter cloacae complex NOT DETECTED NOT DETECTED Final   Escherichia coli DETECTED (A) NOT DETECTED Final    Comment: CRITICAL RESULT CALLED TO, READ BACK BY AND VERIFIED WITH: J WYLAND,PHARMD@[id number]  04/22/24 MK    Klebsiella aerogenes NOT DETECTED NOT DETECTED Final   Klebsiella oxytoca NOT DETECTED NOT DETECTED Final   Klebsiella pneumoniae NOT DETECTED NOT DETECTED Final   Proteus species NOT DETECTED NOT DETECTED Final   Salmonella species NOT DETECTED NOT DETECTED Final   Serratia marcescens NOT DETECTED NOT DETECTED Final   Haemophilus influenzae NOT DETECTED NOT DETECTED Final   Neisseria meningitidis NOT DETECTED NOT DETECTED Final   Pseudomonas aeruginosa NOT DETECTED NOT DETECTED Final   Stenotrophomonas maltophilia NOT DETECTED NOT DETECTED Final  Candida albicans NOT DETECTED NOT DETECTED Final   Candida auris NOT DETECTED NOT DETECTED Final   Candida glabrata NOT DETECTED NOT DETECTED Final   Candida krusei NOT DETECTED NOT DETECTED Final   Candida parapsilosis  NOT DETECTED NOT DETECTED Final   Candida tropicalis NOT DETECTED NOT DETECTED Final   Cryptococcus neoformans/gattii NOT DETECTED NOT DETECTED Final   CTX-M ESBL NOT DETECTED NOT DETECTED Final   Carbapenem resistance IMP NOT DETECTED NOT DETECTED Final   Carbapenem resistance KPC NOT DETECTED NOT DETECTED Final   Methicillin resistance mecA/C NOT DETECTED NOT DETECTED Final   Carbapenem resistance NDM NOT DETECTED NOT DETECTED Final   Carbapenem resist OXA 48 LIKE NOT DETECTED NOT DETECTED Final   Carbapenem resistance VIM NOT DETECTED NOT DETECTED Final    Comment: Performed at [facility], [address] [place], [place] [address]    RADIOLOGY STUDIES/RESULTS: DG Chest Port 1V same Day Result Date: 04/21/2024 CLINICAL DATA:  [id number] Fever [id number] EXAM: PORTABLE CHEST 1 VIEW COMPARISON:  None Available. FINDINGS: The cardiomediastinal silhouette is upper limits of normal in contour. No pleural effusion. No pneumothorax. No acute pleuroparenchymal abnormality. IMPRESSION: No acute cardiopulmonary abnormality. Electronically Signed   By: Clancy Crimes M.D.   On: 04/21/2024 11:46      LOS: 4 days   Kimberly Penna, MD  [facility]    To contact the attending provider between 7A-7P or the covering provider during after hours 7P-7A, please log into the web site [email or web address] and access using universal [place] password for that web site. If you do not have the password, please call the hospital operator.  04/22/2024, 10:12 AM

## 2024-04-22 NOTE — Progress Notes (Addendum)
 Pharmacy reported that , patient has [positive blood cultures. 1 set of cultures has E. coli in both aerobic/anaerobic bottles and staph epi in the anaerobic bottle (thinking this is contaminate). No resistance genes detected.  Form C would recommend starting Ceftriaxone 2g IV q24h (would cover both bugs anyways).  E. coli bacteremia - Starting ceftriaxone 2 g every 24 hours. -Will do repeat blood culture in 48 hours 5/29

## 2024-04-22 NOTE — Consult Note (Signed)
 Consult Note  Tony Lopez Jan 02, 1951  [id number].    Requesting MD: Kimberly Penna, MD Chief Complaint/Reason for Consult: RUQ abdominal pain  HPI:  Patient is a 73 year old male who initially presented to [facility] 5/23 for acute alcohol withdrawal but referred to the ED due to concern for acute DTs. 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 Has been altered since presentation. Reportedly patient and family have noted progressive decline over the last several months. PMH otherwise significant for Hx of Hep C, Hx of prostate CA, HTN, GERD, osteoarthritis. Prior abdominal surgery includes robotic assisted laparoscopic radical prostatectomy in 2018 with pelvic lymph node dissection by Dr. Secundino Dach. Admitted for DTs but noted to have significant electrolyte abnormalities, increased lipase and elevated LFTs, guaiac positive stools concerning for GI bleed. GI was consulted and did not recommend acute endoscopic evaluation and recommended just following LFTs, they have signed off. General surgery consulted today with complaint of RUQ pain.   ROS: Review of Systems  Unable to perform ROS: Mental status change    Family History  Problem Relation Age of Onset   Lung cancer Mother    Prostate cancer Neg Hx     Past Medical History:  Diagnosis Date   Arthritis    GERD (gastroesophageal reflux disease)    Hepatitis C    Hypertension    Prostate cancer (HCC)     Past Surgical History:  Procedure Laterality Date   BACK SURGERY  1999   CYSTOSCOPY WITH INJECTION N/A 10/05/2017   Procedure: CYSTOSCOPY WITH INJECTION INDOCYANINE GREEN DYE;  Surgeon: Osborn Blaze, MD;  Location: [facility];  Service: Urology;  Laterality: N/A;   KNEE SURGERY Left early 90s   CARTILAGE CLEAN OUT    LYMPHADENECTOMY Bilateral 10/05/2017   Procedure: PELVIC LYMPHADENECTOMY;  Surgeon: Osborn Blaze, MD;  Location: [facility]  ORS;  Service: Urology;  Laterality: Bilateral;   ROBOT ASSISTED LAPAROSCOPIC RADICAL PROSTATECTOMY N/A 10/05/2017   Procedure: XI ROBOTIC ASSISTED LAPAROSCOPIC RADICAL PROSTATECTOMY;  Surgeon: Osborn Blaze, MD;  Location: [facility];  Service: Urology;  Laterality: N/A;    Social History:  reports that he has been smoking cigarettes. He has a 35 pack-year smoking history. He has never used smokeless tobacco. He reports current alcohol use. He reports that he does not use drugs.  Allergies: No Known Allergies  Medications Prior to Admission  Medication Sig Dispense Refill   acetaminophen  (TYLENOL ) 500 MG tablet Take 1 tablet (500 mg total) by mouth every 6 (six) hours as needed. (Patient taking differently: Take 500 mg by mouth every 6 (six) hours as needed for mild pain (pain score 1-3).) 30 tablet 0   allopurinol (ZYLOPRIM) 100 MG tablet Take 100 mg by mouth daily.     amLODipine (NORVASC) 5 MG tablet Take 5 mg by mouth daily.     atorvastatin (LIPITOR) 10 MG tablet Take 10 mg by mouth daily.     lisinopril -hydrochlorothiazide  (ZESTORETIC ) 20-25 MG tablet Take 1 tablet by mouth daily.     omeprazole (PRILOSEC) 20 MG capsule Take 20 mg by mouth daily.     potassium chloride SA (KLOR-CON M) 20 MEQ tablet Take 20 mEq by mouth daily.     allopurinol (ZYLOPRIM) 300 MG tablet Take 300 mg by mouth daily. (Patient not taking: Reported on 04/18/2024)      Blood pressure 114/72, pulse 97, temperature 98.7 F (37.1 C),  temperature source Oral, resp. rate (!) 26, height 6' (1.829 m), weight 93 kg, SpO2 92%. Physical Exam:  General: WD, chronically ill appearing male who is laying in bed in NAD HEENT: declined to open his eyes unable to examine sclera Heart: regular, rate, and rhythm.  Palpable radial and pedal pulses bilaterally Lungs: No wheezes, rhonchi, or rales noted.  Respiratory effort nonlabored Abd: soft, TTP in RUQ, protuberant abdomen MS: all 4 extremities are symmetrical with no cyanosis,  clubbing, or edema. Skin: warm and dry with no masses, lesions, or rashes Psych: not oriented, mittens present, delirious    Results for orders placed or performed during the hospital encounter of 04/18/24 (from the past 48 hours)  Gastrointestinal Panel by PCR , Stool     Status: None   Collection Time: 04/20/24  6:39 PM   Specimen: Stool  Result Value Ref Range   Campylobacter species NOT DETECTED NOT DETECTED   Plesimonas shigelloides NOT DETECTED NOT DETECTED   Salmonella species NOT DETECTED NOT DETECTED   Yersinia enterocolitica NOT DETECTED NOT DETECTED   Vibrio species NOT DETECTED NOT DETECTED   Vibrio cholerae NOT DETECTED NOT DETECTED   Enteroaggregative E coli (EAEC) NOT DETECTED NOT DETECTED   Enteropathogenic E coli (EPEC) NOT DETECTED NOT DETECTED   Enterotoxigenic E coli (ETEC) NOT DETECTED NOT DETECTED   Shiga like toxin producing E coli (STEC) NOT DETECTED NOT DETECTED   Shigella/Enteroinvasive E coli (EIEC) NOT DETECTED NOT DETECTED   Cryptosporidium NOT DETECTED NOT DETECTED   Cyclospora cayetanensis NOT DETECTED NOT DETECTED   Entamoeba histolytica NOT DETECTED NOT DETECTED   Giardia lamblia NOT DETECTED NOT DETECTED   Adenovirus F40/41 NOT DETECTED NOT DETECTED   Astrovirus NOT DETECTED NOT DETECTED   Norovirus GI/GII NOT DETECTED NOT DETECTED   Rotavirus A NOT DETECTED NOT DETECTED   Sapovirus (I, II, IV, and V) NOT DETECTED NOT DETECTED    Comment: Performed at [facility], [address] [place], [place] [address]  Type and screen [facility]     Status: None   Collection Time: 04/20/24  6:44 PM  Result Value Ref Range   ABO/RH(D) A POS    Antibody Screen NEG    Sample Expiration      04/23/2024,2359 Performed at [facility], [address] [place], [place] [address]   CBC     Status: Abnormal   Collection Time: 04/20/24  6:46 PM  Result Value Ref Range   WBC 9.6 4.0 - 10.5 K/uL   RBC 2.88 (L) 4.22 - 5.81 MIL/uL    Hemoglobin 9.7 (L) 13.0 - 17.0 g/dL   HCT 09.8 (L) 11.9 - 14.7 %   MCV 94.1 80.0 - 100.0 fL   MCH 33.7 26.0 - 34.0 pg   MCHC 35.8 30.0 - 36.0 g/dL   RDW 82.9 56.2 - 13.0 %   Platelets 87 (L) 150 - 400 K/uL    Comment: Immature Platelet Fraction may be clinically indicated, consider ordering this additional test [id number] REPEATED TO VERIFY    nRBC 0.0 0.0 - 0.2 %    Comment: Performed at [facility], [address] [place], [place] [address]  CBC     Status: Abnormal   Collection Time: 04/21/24  3:32 AM  Result Value Ref Range   WBC 10.1 4.0 - 10.5 K/uL   RBC 2.58 (L) 4.22 - 5.81 MIL/uL   Hemoglobin 8.6 (L) 13.0 - 17.0 g/dL   HCT 84.1 (  L) 39.0 - 52.0 %   MCV 94.2 80.0 - 100.0 fL   MCH 33.3 26.0 - 34.0 pg   MCHC 35.4 30.0 - 36.0 g/dL   RDW 62.1 30.8 - 65.7 %   Platelets 95 (L) 150 - 400 K/uL    Comment: Immature Platelet Fraction may be clinically indicated, consider ordering this additional test [id number] CONSISTENT WITH PREVIOUS RESULT REPEATED TO VERIFY    nRBC 0.0 0.0 - 0.2 %    Comment: Performed at [facility], [address] [place], [place] [address]  Comprehensive metabolic panel with GFR     Status: Abnormal   Collection Time: 04/21/24  3:32 AM  Result Value Ref Range   Sodium 133 (L) 135 - 145 mmol/L   Potassium 3.0 (L) 3.5 - 5.1 mmol/L   Chloride 96 (L) 98 - 111 mmol/L   CO2 26 22 - 32 mmol/L   Glucose, Bld 138 (H) 70 - 99 mg/dL    Comment: Glucose reference range applies only to samples taken after fasting for at least 8 hours.   BUN 21 8 - 23 mg/dL   Creatinine, Ser 2.44 0.61 - 1.24 mg/dL   Calcium 8.4 (L) 8.9 - 10.3 mg/dL   Total Protein 5.0 (L) 6.5 - 8.1 g/dL   Albumin 2.4 (L) 3.5 - 5.0 g/dL   AST 82 (H) 15 - 41 U/L   ALT 65 (H) 0 - 44 U/L   Alkaline Phosphatase 109 38 - 126 U/L   Total Bilirubin 3.1 (H) 0.0 - 1.2 mg/dL   GFR, Estimated >01 >02 mL/min    Comment: (NOTE) Calculated using the CKD-EPI Creatinine Equation (2021)     Anion gap 11 5 - 15    Comment: Performed at [facility], [address] [place], [place] [address]  Magnesium      Status: None   Collection Time: 04/21/24  3:32 AM  Result Value Ref Range   Magnesium  1.8 1.7 - 2.4 mg/dL    Comment: Performed at [facility], [address], [place], [place] [address]  Phosphorus     Status: Abnormal   Collection Time: 04/21/24  3:32 AM  Result Value Ref Range   Phosphorus <1.0 (LL) 2.5 - 4.6 mg/dL    Comment: REPEATED TO VERIFY CRITICAL RESULT CALLED TO, READ BACK BY AND VERIFIED WITH CAMOLISTA, R. RN @0552  04/21/24 SATRAINR OKAY TO RELEASE Performed at [facility], [address] [place], [place] [address]   Urinalysis, Routine w reflex microscopic -Urine, Clean Catch     Status: Abnormal   Collection Time: 04/21/24 11:14 AM  Result Value Ref Range   Color, Urine AMBER (A) YELLOW    Comment: BIOCHEMICALS MAY BE AFFECTED BY COLOR   APPearance CLOUDY (A) CLEAR   Specific Gravity, Urine 1.027 1.005 - 1.030   pH 5.0 5.0 - 8.0   Glucose, UA NEGATIVE NEGATIVE mg/dL   Hgb urine dipstick MODERATE (A) NEGATIVE   Bilirubin Urine NEGATIVE NEGATIVE   Ketones, ur 5 (A) NEGATIVE mg/dL   Protein, ur 30 (A) NEGATIVE mg/dL   Nitrite NEGATIVE NEGATIVE   Leukocytes,Ua NEGATIVE NEGATIVE   RBC / HPF 0-5 0 - 5 RBC/hpf   WBC, UA 0-5 0 - 5 WBC/hpf   Bacteria, UA MANY (A) NONE SEEN   Squamous Epithelial / HPF 0-5 0 - 5 /HPF   Mucus PRESENT    Hyaline Casts, UA PRESENT    Amorphous Crystal PRESENT  Comment: Performed at [facility], [address], [place], [place] [address]  Culture, blood (Routine X 2) w Reflex to ID Panel     Status: None (Preliminary result)   Collection Time: 04/21/24 12:02 PM   Specimen: BLOOD LEFT HAND  Result Value Ref Range   Specimen Description BLOOD LEFT HAND    Special Requests      BOTTLES DRAWN AEROBIC AND ANAEROBIC Blood Culture adequate volume   Culture  Setup Time      GRAM NEGATIVE RODS GRAM  POSITIVE COCCI ANAEROBIC BOTTLE ONLY GRAM NEGATIVE RODS AEROBIC BOTTLE ONLY CRITICAL RESULT CALLED TO, READ BACK BY AND VERIFIED WITH: J WYLAND,PHARMD@0320  04/22/24 MK Performed at [facility], [address], [place], [place] [address]    Culture GRAM NEGATIVE RODS GRAM POSITIVE COCCI     Report Status PENDING   Culture, blood (Routine X 2) w Reflex to ID Panel     Status: None (Preliminary result)   Collection Time: 04/21/24 12:02 PM   Specimen: BLOOD RIGHT ARM  Result Value Ref Range   Specimen Description BLOOD RIGHT ARM    Special Requests      BOTTLES DRAWN AEROBIC ONLY Blood Culture adequate volume   Culture  Setup Time      GRAM NEGATIVE RODS GRAM POSITIVE COCCI AEROBIC BOTTLE ONLY Performed at [facility], [address], [place], [place] [address]    Culture GRAM NEGATIVE RODS GRAM POSITIVE COCCI     Report Status PENDING   Blood Culture ID Panel (Reflexed)     Status: Abnormal   Collection Time: 04/21/24 12:02 PM  Result Value Ref Range   Enterococcus faecalis NOT DETECTED NOT DETECTED   Enterococcus Faecium NOT DETECTED NOT DETECTED   Listeria monocytogenes NOT DETECTED NOT DETECTED   Staphylococcus species DETECTED (A) NOT DETECTED    Comment: CRITICAL RESULT CALLED TO, READ BACK BY AND VERIFIED WITH: J WYLAND,PHARMD@0319  04/22/24 MK    Staphylococcus aureus (BCID) NOT DETECTED NOT DETECTED   Staphylococcus epidermidis DETECTED (A) NOT DETECTED    Comment: CRITICAL RESULT CALLED TO, READ BACK BY AND VERIFIED WITH: J WYLAND,PHARMD@0319  04/22/24 MK    Staphylococcus lugdunensis NOT DETECTED NOT DETECTED   Streptococcus species NOT DETECTED NOT DETECTED   Streptococcus agalactiae NOT DETECTED NOT DETECTED   Streptococcus pneumoniae NOT DETECTED NOT DETECTED   Streptococcus pyogenes NOT DETECTED NOT DETECTED   A.calcoaceticus-baumannii NOT DETECTED NOT DETECTED   Bacteroides fragilis NOT DETECTED NOT DETECTED   Enterobacterales DETECTED (A) NOT  DETECTED    Comment: Enterobacterales represent a large order of gram negative bacteria, not a single organism. CRITICAL RESULT CALLED TO, READ BACK BY AND VERIFIED WITH: J [facility]  04/22/24 MK    Enterobacter cloacae complex NOT DETECTED NOT DETECTED   Escherichia coli DETECTED (A) NOT DETECTED    Comment: CRITICAL RESULT CALLED TO, READ BACK BY AND VERIFIED WITH: J WYLAND,PHARMD@0319  04/22/24 MK    Klebsiella aerogenes NOT DETECTED NOT DETECTED   Klebsiella oxytoca NOT DETECTED NOT DETECTED   Klebsiella pneumoniae NOT DETECTED NOT DETECTED   Proteus species NOT DETECTED NOT DETECTED   Salmonella species NOT DETECTED NOT DETECTED   Serratia marcescens NOT DETECTED NOT DETECTED   Haemophilus influenzae NOT DETECTED NOT DETECTED   Neisseria meningitidis NOT DETECTED NOT DETECTED   Pseudomonas aeruginosa NOT DETECTED NOT DETECTED   Stenotrophomonas maltophilia NOT DETECTED NOT DETECTED   Candida albicans NOT DETECTED NOT DETECTED   Candida auris NOT DETECTED NOT DETECTED  Candida glabrata NOT DETECTED NOT DETECTED   Candida krusei NOT DETECTED NOT DETECTED   Candida parapsilosis NOT DETECTED NOT DETECTED   Candida tropicalis NOT DETECTED NOT DETECTED   Cryptococcus neoformans/gattii NOT DETECTED NOT DETECTED   CTX-M ESBL NOT DETECTED NOT DETECTED   Carbapenem resistance IMP NOT DETECTED NOT DETECTED   Carbapenem resistance KPC NOT DETECTED NOT DETECTED   Methicillin resistance mecA/C NOT DETECTED NOT DETECTED   Carbapenem resistance NDM NOT DETECTED NOT DETECTED   Carbapenem resist OXA 48 LIKE NOT DETECTED NOT DETECTED   Carbapenem resistance VIM NOT DETECTED NOT DETECTED    Comment: Performed at [facility], [address], [place], [place] [address]  Basic metabolic panel     Status: Abnormal   Collection Time: 04/21/24  1:43 PM  Result Value Ref Range   Sodium 134 (L) 135 - 145 mmol/L   Potassium 3.7 3.5 - 5.1 mmol/L   Chloride 98 98 - 111 mmol/L   CO2 26  22 - 32 mmol/L   Glucose, Bld 152 (H) 70 - 99 mg/dL    Comment: Glucose reference range applies only to samples taken after fasting for at least 8 hours.   BUN 18 8 - 23 mg/dL   Creatinine, Ser 0.27 0.61 - 1.24 mg/dL   Calcium 8.2 (L) 8.9 - 10.3 mg/dL   GFR, Estimated >25 >36 mL/min    Comment: (NOTE) Calculated using the CKD-EPI Creatinine Equation (2021)    Anion gap 10 5 - 15    Comment: Performed at [facility], [address] [place], [place] [address]  Phosphorus     Status: Abnormal   Collection Time: 04/21/24  1:43 PM  Result Value Ref Range   Phosphorus 2.0 (L) 2.5 - 4.6 mg/dL    Comment: Performed at [facility], [address], [place], [place] [address]  CBC     Status: Abnormal   Collection Time: 04/22/24  3:29 AM  Result Value Ref Range   WBC 10.3 4.0 - 10.5 K/uL   RBC 2.53 (L) 4.22 - 5.81 MIL/uL   Hemoglobin 8.4 (L) 13.0 - 17.0 g/dL   HCT 95.6 (L) 38.7 - 56.4 %   MCV 95.7 80.0 - 100.0 fL   MCH 33.2 26.0 - 34.0 pg   MCHC 34.7 30.0 - 36.0 g/dL   RDW 33.2 95.1 - 88.4 %   Platelets 122 (L) 150 - 400 K/uL    Comment: REPEATED TO VERIFY   nRBC 0.6 (H) 0.0 - 0.2 %    Comment: Performed at [facility], [address], [place], [place] [address]  Comprehensive metabolic panel with GFR     Status: Abnormal   Collection Time: 04/22/24  3:29 AM  Result Value Ref Range   Sodium 135 135 - 145 mmol/L   Potassium 3.2 (L) 3.5 - 5.1 mmol/L   Chloride 101 98 - 111 mmol/L   CO2 25 22 - 32 mmol/L   Glucose, Bld 114 (H) 70 - 99 mg/dL    Comment: Glucose reference range applies only to samples taken after fasting for at least 8 hours.   BUN 14 8 - 23 mg/dL   Creatinine, Ser 3.01 0.61 - 1.24 mg/dL   Calcium 7.8 (L) 8.9 - 10.3 mg/dL   Total Protein 4.9 (L) 6.5 - 8.1 g/dL   Albumin 2.2 (L) 3.5 - 5.0 g/dL   AST 64 (H) 15 - 41 U/L   ALT 54 (H) 0 -  44 U/L   Alkaline Phosphatase 118 38 - 126 U/L   Total Bilirubin 2.5 (H) 0.0 - 1.2 mg/dL   GFR, Estimated >24  >40 mL/min    Comment: (NOTE) Calculated using the CKD-EPI Creatinine Equation (2021)    Anion gap 9 5 - 15    Comment: Performed at [facility], [address] [place], [place] [address]  Magnesium      Status: Abnormal   Collection Time: 04/22/24  3:29 AM  Result Value Ref Range   Magnesium  1.6 (L) 1.7 - 2.4 mg/dL    Comment: Performed at [facility], [address] [place], [place] [address]   DG Chest [place] 1V same Day Result Date: 04/21/2024 CLINICAL DATA:  [id number] Fever [id number] EXAM: PORTABLE CHEST 1 VIEW COMPARISON:  None Available. FINDINGS: The cardiomediastinal silhouette is upper limits of normal in contour. No pleural effusion. No pneumothorax. No acute pleuroparenchymal abnormality. IMPRESSION: No acute cardiopulmonary abnormality. Electronically Signed   By: Clancy Crimes M.D.   On: 04/21/2024 11:46      Assessment/Plan Bacteremia  RUQ abdominal pain Elevated LFTs - CT AP 5/23 with no acute inflammatory process in abdomen or pelvis, ?lesion in L kidney of unclear etiology  - RUQ US  5/24 with biliary sludge and probable small gallstones, no evidence of acute cholecystitis, no overt cirrhosis, diffuse hepatic steatosis - RUQ US  today distended gallbladder with cholelithiasis and sludge but no pericholecystic fluid or gallbladder wall thickening noted - Tbili was 6 on admit, currently 2.5, AST/ALT in 200s on admit and now down trending - no leukocytosis currently, WBC 11.5 on 5/25 - pt is ttp in RUQ but this could be secondary to liver dysfunction and acute alcoholic hepatitis  - at this point would recommend HIDA to better declare whether this is acute cholecystitis - if negative for acute cholecystitis would not recommend cholecystectomy and would recommend UCx to help clarify source of bacteremia. If HIDA positive will consider if patient is an operative candidate for cholecystectomy vs needs percutaneous cholecystostomy   FEN: Ok to have a diet from  surgical perspective but will need to be NPO  VTE: SCDs ID: [email or web address]    I reviewed ED provider notes, Consultant GI notes, hospitalist notes, last 24 h vitals and pain scores, last 48 h intake and output, last 24 h labs and trends, and last 24 h imaging results.  This care required high  level of medical decision making.   Annetta Killian, [facility] 04/22/2024, 1:08 PM Please see Amion for pager number during day hours 7:00am-4:30pm

## 2024-04-22 NOTE — Care Management Important Message (Signed)
 Important Message  Patient Details  Name: Tony Lopez MRN: [id number] Date of Birth: [birth year]   Important Message Given:  Yes - Medicare IM     Felix Host 04/22/2024, 3:53 PM

## 2024-04-23 ENCOUNTER — Inpatient Hospital Stay (HOSPITAL_COMMUNITY)

## 2024-04-23 DIAGNOSIS — K7010 Alcoholic hepatitis without ascites: Secondary | ICD-10-CM | POA: Diagnosis not present

## 2024-04-23 DIAGNOSIS — K922 Gastrointestinal hemorrhage, unspecified: Secondary | ICD-10-CM | POA: Diagnosis not present

## 2024-04-23 DIAGNOSIS — N179 Acute kidney failure, unspecified: Secondary | ICD-10-CM | POA: Diagnosis not present

## 2024-04-23 DIAGNOSIS — F10931 Alcohol use, unspecified with withdrawal delirium: Secondary | ICD-10-CM | POA: Diagnosis not present

## 2024-04-23 LAB — CBC
HCT: 24.3 % — ABNORMAL LOW (ref 39.0–52.0)
Hemoglobin: 8.3 g/dL — ABNORMAL LOW (ref 13.0–17.0)
MCH: 33.2 pg (ref 26.0–34.0)
MCHC: 34.2 g/dL (ref 30.0–36.0)
MCV: 97.2 fL (ref 80.0–100.0)
Platelets: 135 10*3/uL — ABNORMAL LOW (ref 150–400)
RBC: 2.5 MIL/uL — ABNORMAL LOW (ref 4.22–5.81)
RDW: 14.8 % (ref 11.5–15.5)
WBC: 11.2 10*3/uL — ABNORMAL HIGH (ref 4.0–10.5)
nRBC: 0.2 % (ref 0.0–0.2)

## 2024-04-23 LAB — COMPREHENSIVE METABOLIC PANEL WITH GFR
ALT: 40 U/L (ref 0–44)
AST: 45 U/L — ABNORMAL HIGH (ref 15–41)
Albumin: 2 g/dL — ABNORMAL LOW (ref 3.5–5.0)
Alkaline Phosphatase: 123 U/L (ref 38–126)
Anion gap: 8 (ref 5–15)
BUN: 11 mg/dL (ref 8–23)
CO2: 25 mmol/L (ref 22–32)
Calcium: 7.7 mg/dL — ABNORMAL LOW (ref 8.9–10.3)
Chloride: 102 mmol/L (ref 98–111)
Creatinine, Ser: 0.72 mg/dL (ref 0.61–1.24)
GFR, Estimated: >60 mL/min (ref >60)
Glucose, Bld: 101 mg/dL — ABNORMAL HIGH (ref 70–99)
Potassium: 3.9 mmol/L (ref 3.5–5.1)
Sodium: 135 mmol/L (ref 135–145)
Total Bilirubin: 2.1 mg/dL — ABNORMAL HIGH (ref 0.0–1.2)
Total Protein: 4.8 g/dL — ABNORMAL LOW (ref 6.5–8.1)

## 2024-04-23 LAB — PHOSPHORUS: Phosphorus: 3.4 mg/dL (ref 2.5–4.6)

## 2024-04-23 LAB — VITAMIN B1: Vitamin B1 (Thiamine): 81.5 nmol/L (ref 66.5–200.0)

## 2024-04-23 LAB — MAGNESIUM: Magnesium: 1.9 mg/dL (ref 1.7–2.4)

## 2024-04-23 MED ORDER — LEVALBUTEROL HCL 0.63 MG/3ML IN NEBU
0.6300 mg | INHALATION_SOLUTION | Freq: Three times a day (TID) | RESPIRATORY_TRACT | Status: DC
  Administered 2024-04-23 – 2024-04-24 (×3): 0.63 mg via RESPIRATORY_TRACT
  Filled 2024-04-23 (×3): qty 3

## 2024-04-23 MED ORDER — IPRATROPIUM BROMIDE 0.02 % IN SOLN: 0.5000 mg | Freq: Three times a day (TID) | RESPIRATORY_TRACT | Status: DC

## 2024-04-23 MED ORDER — SODIUM CHLORIDE 0.9 % IV SOLN
2.0000 g | Freq: Three times a day (TID) | INTRAVENOUS | Status: DC
  Administered 2024-04-23 – 2024-04-26 (×9): 2 g via INTRAVENOUS
  Filled 2024-04-23 (×10): qty 12.5

## 2024-04-23 MED ORDER — IPRATROPIUM BROMIDE 0.02 % IN SOLN
0.5000 mg | Freq: Three times a day (TID) | RESPIRATORY_TRACT | Status: DC
  Administered 2024-04-23 – 2024-04-24 (×4): 0.5 mg via RESPIRATORY_TRACT
  Filled 2024-04-23 (×3): qty 2.5

## 2024-04-23 MED ORDER — LEVALBUTEROL HCL 0.63 MG/3ML IN NEBU
0.6300 mg | INHALATION_SOLUTION | Freq: Three times a day (TID) | RESPIRATORY_TRACT | Status: DC
  Administered 2024-04-23: 0.63 mg via RESPIRATORY_TRACT
  Filled 2024-04-23: qty 3

## 2024-04-23 MED ORDER — METOPROLOL TARTRATE 25 MG PO TABS
25.0000 mg | ORAL_TABLET | Freq: Two times a day (BID) | ORAL | Status: DC
  Administered 2024-04-23 – 2024-05-02 (×17): 25 mg via ORAL
  Filled 2024-04-23 (×17): qty 1

## 2024-04-23 MED ORDER — IPRATROPIUM BROMIDE 0.02 % IN SOLN
0.2500 mg | Freq: Three times a day (TID) | RESPIRATORY_TRACT | Status: DC
  Filled 2024-04-23: qty 2.5

## 2024-04-23 MED ORDER — MORPHINE SULFATE (PF) 4 MG/ML IV SOLN
INTRAVENOUS | Status: AC
  Administered 2024-04-23: 3 mg via INTRAVENOUS
  Filled 2024-04-23: qty 1

## 2024-04-23 MED ORDER — MORPHINE SULFATE (PF) 4 MG/ML IV SOLN: 3.0000 mg | Freq: Once | INTRAVENOUS | Status: AC

## 2024-04-23 MED ORDER — TECHNETIUM TC 99M MEBROFENIN IV KIT
7.5000 | PACK | Freq: Once | INTRAVENOUS | Status: AC | PRN
Start: 2024-04-23 — End: 2024-04-23
  Administered 2024-04-23: 7.5 via INTRAVENOUS

## 2024-04-23 NOTE — Plan of Care (Signed)

## 2024-04-23 NOTE — Progress Notes (Signed)
 Morganella is now also growing in blood. D/w Dr Hilton Lucky, we will change ceftriaxone to cefepime since it could be an AmpC ESBL organism.   Cefepime 2g IV q8  Ivery Marking, PharmD, [place], AAHIVP, CPP Infectious Disease Pharmacist 04/23/2024 1:39 PM

## 2024-04-23 NOTE — Progress Notes (Addendum)
 PROGRESS NOTE        PATIENT DETAILS Name: Tony Lopez Age: 73 y.o. Sex: male Date of Birth: 08/15/1951 Admit Date: 04/18/2024 Admitting Physician [name], MD [id number], [name], [name]  Brief Summary: Patient is a 73 y.o.  male with history of HTN, prostate cancer, GERD, EtOH use-who was referred from [facility] to ED for alcohol withdrawal symptoms.  Significant events: 5/23>> admit to [facility]. 5/26>>Fever-blood culture- gm neg rod/gm +ve rod 5/27>> RUQ tender-CCS consulted.  Significant studies: 5/23>> CT head: No acute intracranial abnormality 5/23>> CT abdomen/pelvis: No acute abnormality. 5/24>> RUQ ultrasound: Biliary sludge/small gallstones.  No evidence of acute cholecystitis. 5/27>> RUQ ultrasound: Distended gallbladder with cholelithiasis/sludge.  Significant microbiology data: 5/25>> GI pathogen panel: Negative 5/26>> blood culture: E. coli/gram-positive cocci (BCID staph epi/E. coli-suspect staph epi is a contamination)  Procedures: None  Consults: GI CCS  Subjective: Awake-able to tell me name-his age-moves all 4 extremities when asked.  Follows most of my commands/questions this morning.  Objective: Vitals: Blood pressure 132/69, pulse 92, temperature 97.7 F (36.5 C), temperature source Axillary, resp. rate (!) 26, height 6' (1.829 m), weight 88.3 kg, SpO2 95%.   Exam: Awake/alert-less lethargic compared to the past several days Chest: Some scattered wheezing CVS: S1-S2 regular Abdomen: RUQ tenderness but soft-some guarding in the RUQ area. Extremities: No edema Nonfocal exam but has generalized weakness.   Pertinent Labs/Radiology:    Latest Ref Rng & Units 04/23/2024    3:25 AM 04/22/2024    3:29 AM 04/21/2024    3:32 AM  CBC  WBC 4.0 - 10.5 K/uL 11.2  10.3  10.1   Hemoglobin 13.0 - 17.0 g/dL 8.3  8.4  8.6   Hematocrit 39.0 - 52.0 % 24.3  24.2  24.3   Platelets 150 - 400 K/uL 135  122  95     Lab Results   Component Value Date   NA 135 04/23/2024   K 3.9 04/23/2024   CL 102 04/23/2024   CO2 25 04/23/2024      Assessment/Plan: EtOH withdrawal with DTs Per psych NP documentation-last drink was on 5/22 (1 quart of whiskey will last every 2 days)  Slowly improving-continues to be more awake/alert compared to the day before Tachycardia has resolved Completed Ativan CIWA protocol Should be almost out of the window for any further alcohol withdrawals at this point Continue supportive care.  Sinus tachycardia with PACs Secondary to DTs Tachycardia has essentially resolved-with treatment of underlying DTs-and with initiation of low-dose beta-blockers.  E. coli bacteremia Fever on 5/26-afebrile overnight Suspicion that bacteremia may be from biliary etiology.  UA really not consistent with UTI.  Also did have some transient diarrhea that could have translocated bacteria into bloodstream. Continues to have RUQ tenderness-although mild-on exam RUQ ultrasound on 5/27 essentially unchanged HIDA scan scheduled for today On IV Rocephin CCS following.  Repeat blood cultures  Note-Gram-positive cocci in blood cultures is likely a contamination  AKI Hemodynamically mediated-poor oral intake/use of lisinopril /HCTZ Resolved with supportive care.  Hyponatremia Secondary to dehydration/poor oral intake/HCTZ use Resolved.  Diarrhea Prn immodium GI pathogen panel negative  Elevated lipase level Initially not felt to have abdominal pain-no evidence of pancreatitis on CT However on 5/27-RUQ pain-see above regarding plans for repeat RUQ ultrasound given gram-negative bacteremia.    EtOH hepatitis Supportive care Per GI note-no indication for steroid  as discriminant function I<10.  Thrombocytopenia Secondary to EtOH use Continues to have mild thrombocytopenia-but this slowly improving. No evidence of bleeding  Normocytic anemia Probably related to EtOH related bone marrow  suppression No evidence of acute blood loss-although FOBT positive Trend CBC for now.  Guaiac positive stool Outpatient GI follow-up-as no evidence of active bleeding.  Hb stable.  Cholelithiasis Initially felt to be asymptomatic-but now having fever and RUQ tenderness-see above.  1.6 x 2.4 cm structure arising from left kidney Incidental finding on CT abdomen. Per radiology-favored to represent proteinaceous/hemorrhagic cyst Will need outpatient imaging postdischarge.  Tobacco abuse Transdermal nicotine Some wheezing today-longstanding history-unclear if he has underlying COPD-starting bronchodilators and seeing how he does.  BMI: Estimated body mass index is 26.4 kg/m as calculated from the following:   Height as of this encounter: 6' (1.829 m).   Weight as of this encounter: 88.3 kg.   Code status:   Code Status: Full Code   DVT Prophylaxis: SCDs Start: 04/18/24 2024   Family Communication: Ex wife-Nikki Turnage [phone] updated over the phone 5/27   Disposition Plan: Status is: Inpatient Remains inpatient appropriate because: Severity of illness   Planned Discharge Destination:Home health   Diet: Diet Order             Diet NPO time specified  Diet effective midnight                     Antimicrobial agents: Anti-infectives (From admission, onward)    Start     Dose/Rate Route Frequency Ordered Stop   04/22/24 0400  cefTRIAXone (ROCEPHIN) 2 g in sodium chloride  0.9 % 100 mL IVPB        2 g 200 mL/hr over 30 Minutes Intravenous Every 24 hours 04/22/24 0333          MEDICATIONS: Scheduled Meds:  allopurinol  100 mg Oral Daily   folic acid  1 mg Oral Daily   lidocaine   1 patch Transdermal Q24H   LORazepam  0-4 mg Intravenous Q8H   metoprolol tartrate  5 mg Intravenous Q8H    morphine injection  3 mg Intravenous Once   morphine (PF)       multivitamin with minerals  1 tablet Oral Daily   nicotine  21 mg Transdermal Daily    pantoprazole (PROTONIX) IV  40 mg Intravenous Q12H   thiamine  100 mg Oral Daily   Or   thiamine  100 mg Intravenous Daily   Continuous Infusions:  cefTRIAXone (ROCEPHIN)  IV Stopped (04/23/24 0422)   lactated ringers  Stopped (04/23/24 0716)   PRN Meds:.loperamide, LORazepam, morphine (PF), ondansetron  **OR** ondansetron  (ZOFRAN ) IV   I have personally reviewed following labs and imaging studies  LABORATORY DATA: CBC: Recent Labs  Lab 04/18/24 1354 04/18/24 2128 04/20/24 0528 04/20/24 1846 04/21/24 0332 04/22/24 0329 04/23/24 0325  WBC 10.7*   < > 11.5* 9.6 10.1 10.3 11.2*  NEUTROABS 9.1*  --   --   --   --   --   --   HGB 11.1*   < > 9.3* 9.7* 8.6* 8.4* 8.3*  HCT 31.3*   < > 26.6* 27.1* 24.3* 24.2* 24.3*  MCV 94.8   < > 93.0 94.1 94.2 95.7 97.2  PLT 66*   < > 65* 87* 95* 122* 135*   < > = values in this interval not displayed.    Basic Metabolic Panel: Recent Labs  Lab 04/20/24 0528 04/21/24 0332 04/21/24 1343 04/22/24 0329 04/23/24  0325  NA 132* 133* 134* 135 135  K 3.2* 3.0* 3.7 3.2* 3.9  CL 92* 96* 98 101 102  CO2 23 26 26 25 25   GLUCOSE 111* 138* 152* 114* 101*  BUN 25* 21 18 14 11   CREATININE 1.25* 0.90 0.80 0.71 0.72  CALCIUM 8.6* 8.4* 8.2* 7.8* 7.7*  MG 1.4* 1.8  --  1.6* 1.9  PHOS  --  <1.0* 2.0*  --  3.4    GFR: Estimated Creatinine Clearance: 93 mL/min (by C-G formula based on SCr of 0.72 mg/dL).  Liver Function Tests: Recent Labs  Lab 04/19/24 0217 04/20/24 0528 04/21/24 0332 04/22/24 0329 04/23/24 0325  AST 178* 128* 82* 64* 45*  ALT 103* 88* 65* 54* 40  ALKPHOS 123 123 109 118 123  BILITOT 5.5* 4.6* 3.1* 2.5* 2.1*  PROT 5.0* 5.4* 5.0* 4.9* 4.8*  ALBUMIN 2.7* 2.7* 2.4* 2.2* 2.0*   Recent Labs  Lab 04/18/24 1354 04/20/24 0528  LIPASE 253* 266*   No results for input(s): "AMMONIA" in the last 168 hours.  Coagulation Profile: Recent Labs  Lab 04/18/24 1437  INR 0.9    Cardiac Enzymes: No results for input(s): "CKTOTAL",  "CKMB", "CKMBINDEX", "TROPONINI" in the last 168 hours.  BNP (last 3 results) No results for input(s): "PROBNP" in the last 8760 hours.  Lipid Profile: No results for input(s): "CHOL", "HDL", "LDLCALC", "TRIG", "CHOLHDL", "LDLDIRECT" in the last 72 hours.  Thyroid Function Tests: No results for input(s): "TSH", "T4TOTAL", "FREET4", "T3FREE", "THYROIDAB" in the last 72 hours.  Anemia Panel: No results for input(s): "VITAMINB12", "FOLATE", "FERRITIN", "TIBC", "IRON", "RETICCTPCT" in the last 72 hours.  Urine analysis:    Component Value Date/Time   COLORURINE AMBER (A) 04/21/2024 1114   APPEARANCEUR CLOUDY (A) 04/21/2024 1114   LABSPEC 1.027 04/21/2024 1114   PHURINE 5.0 04/21/2024 1114   GLUCOSEU NEGATIVE 04/21/2024 1114   HGBUR MODERATE (A) 04/21/2024 1114   BILIRUBINUR NEGATIVE 04/21/2024 1114   KETONESUR 5 (A) 04/21/2024 1114   PROTEINUR 30 (A) 04/21/2024 1114   NITRITE NEGATIVE 04/21/2024 1114   LEUKOCYTESUR NEGATIVE 04/21/2024 1114    Sepsis Labs: Lactic Acid, Venous No results found for: "LATICACIDVEN"  MICROBIOLOGY: Recent Results (from the past 240 hours)  Gastrointestinal Panel by PCR , Stool     Status: None   Collection Time: 04/20/24  6:39 PM   Specimen: Stool  Result Value Ref Range Status   Campylobacter species NOT DETECTED NOT DETECTED Final   Plesimonas shigelloides NOT DETECTED NOT DETECTED Final   Salmonella species NOT DETECTED NOT DETECTED Final   Yersinia enterocolitica NOT DETECTED NOT DETECTED Final   Vibrio species NOT DETECTED NOT DETECTED Final   Vibrio cholerae NOT DETECTED NOT DETECTED Final   Enteroaggregative E coli (EAEC) NOT DETECTED NOT DETECTED Final   Enteropathogenic E coli (EPEC) NOT DETECTED NOT DETECTED Final   Enterotoxigenic E coli (ETEC) NOT DETECTED NOT DETECTED Final   Shiga like toxin producing E coli (STEC) NOT DETECTED NOT DETECTED Final   Shigella/Enteroinvasive E coli (EIEC) NOT DETECTED NOT DETECTED Final    Cryptosporidium NOT DETECTED NOT DETECTED Final   Cyclospora cayetanensis NOT DETECTED NOT DETECTED Final   Entamoeba histolytica NOT DETECTED NOT DETECTED Final   Giardia lamblia NOT DETECTED NOT DETECTED Final   Adenovirus F40/41 NOT DETECTED NOT DETECTED Final   Astrovirus NOT DETECTED NOT DETECTED Final   Norovirus GI/GII NOT DETECTED NOT DETECTED Final   Rotavirus A NOT DETECTED NOT DETECTED Final   Sapovirus (  I, II, IV, and V) NOT DETECTED NOT DETECTED Final    Comment: Performed at [facility], [address] [place], [place] [address]  Culture, blood (Routine X 2) w Reflex to ID Panel     Status: Abnormal (Preliminary result)   Collection Time: 04/21/24 12:02 PM   Specimen: BLOOD LEFT HAND  Result Value Ref Range Status   Specimen Description BLOOD LEFT HAND  Final   Special Requests   Final    BOTTLES DRAWN AEROBIC AND ANAEROBIC Blood Culture adequate volume   Culture  Setup Time   Final    GRAM NEGATIVE RODS GRAM POSITIVE COCCI ANAEROBIC BOTTLE ONLY GRAM NEGATIVE RODS AEROBIC BOTTLE ONLY CRITICAL RESULT CALLED TO, READ BACK BY AND VERIFIED WITH: J WYLAND,PHARMD@0320  04/22/24 MK    Culture (A)  Final    ESCHERICHIA COLI GRAM POSITIVE COCCI CULTURE REINCUBATED FOR BETTER GROWTH Performed at [facility], [address] [place], [place] [address]    Report Status PENDING  Incomplete  Culture, blood (Routine X 2) w Reflex to ID Panel     Status: None (Preliminary result)   Collection Time: 04/21/24 12:02 PM   Specimen: BLOOD RIGHT ARM  Result Value Ref Range Status   Specimen Description BLOOD RIGHT ARM  Final   Special Requests   Final    BOTTLES DRAWN AEROBIC ONLY Blood Culture adequate volume   Culture  Setup Time   Final    GRAM NEGATIVE RODS GRAM POSITIVE COCCI AEROBIC BOTTLE ONLY Performed at [facility], [address] [place], [place] [address]    Culture Hillis Lu NEGATIVE RODS GRAM POSITIVE COCCI   Final   Report Status PENDING   Incomplete  Blood Culture ID Panel (Reflexed)     Status: Abnormal   Collection Time: 04/21/24 12:02 PM  Result Value Ref Range Status   Enterococcus faecalis NOT DETECTED NOT DETECTED Final   Enterococcus Faecium NOT DETECTED NOT DETECTED Final   Listeria monocytogenes NOT DETECTED NOT DETECTED Final   Staphylococcus species DETECTED (A) NOT DETECTED Final    Comment: CRITICAL RESULT CALLED TO, READ BACK BY AND VERIFIED WITH: J WYLAND,PHARMD@0319  04/22/24 MK    Staphylococcus aureus (BCID) NOT DETECTED NOT DETECTED Final   Staphylococcus epidermidis DETECTED (A) NOT DETECTED Final    Comment: CRITICAL RESULT CALLED TO, READ BACK BY AND VERIFIED WITH: J WYLAND,PHARMD@0319  04/22/24 MK    Staphylococcus lugdunensis NOT DETECTED NOT DETECTED Final   Streptococcus species NOT DETECTED NOT DETECTED Final   Streptococcus agalactiae NOT DETECTED NOT DETECTED Final   Streptococcus pneumoniae NOT DETECTED NOT DETECTED Final   Streptococcus pyogenes NOT DETECTED NOT DETECTED Final   A.calcoaceticus-baumannii NOT DETECTED NOT DETECTED Final   Bacteroides fragilis NOT DETECTED NOT DETECTED Final   Enterobacterales DETECTED (A) NOT DETECTED Final    Comment: Enterobacterales represent a large order of gram negative bacteria, not a single organism. CRITICAL RESULT CALLED TO, READ BACK BY AND VERIFIED WITH: J WYLAND,PHARMD@0319  04/22/24 MK    Enterobacter cloacae complex NOT DETECTED NOT DETECTED Final   Escherichia coli DETECTED (A) NOT DETECTED Final    Comment: CRITICAL RESULT CALLED TO, READ BACK BY AND VERIFIED WITH: [facility]  04/22/24 MK    Klebsiella aerogenes NOT DETECTED NOT DETECTED Final   Klebsiella oxytoca NOT DETECTED NOT DETECTED Final   Klebsiella pneumoniae NOT DETECTED NOT DETECTED Final   Proteus species NOT DETECTED NOT DETECTED Final   Salmonella species NOT DETECTED NOT DETECTED Final   Serratia  marcescens NOT DETECTED NOT DETECTED Final   Haemophilus  influenzae NOT DETECTED NOT DETECTED Final   Neisseria meningitidis NOT DETECTED NOT DETECTED Final   Pseudomonas aeruginosa NOT DETECTED NOT DETECTED Final   Stenotrophomonas maltophilia NOT DETECTED NOT DETECTED Final   Candida albicans NOT DETECTED NOT DETECTED Final   Candida auris NOT DETECTED NOT DETECTED Final   Candida glabrata NOT DETECTED NOT DETECTED Final   Candida krusei NOT DETECTED NOT DETECTED Final   Candida parapsilosis NOT DETECTED NOT DETECTED Final   Candida tropicalis NOT DETECTED NOT DETECTED Final   Cryptococcus neoformans/gattii NOT DETECTED NOT DETECTED Final   CTX-M ESBL NOT DETECTED NOT DETECTED Final   Carbapenem resistance IMP NOT DETECTED NOT DETECTED Final   Carbapenem resistance KPC NOT DETECTED NOT DETECTED Final   Methicillin resistance mecA/C NOT DETECTED NOT DETECTED Final   Carbapenem resistance NDM NOT DETECTED NOT DETECTED Final   Carbapenem resist OXA 48 LIKE NOT DETECTED NOT DETECTED Final   Carbapenem resistance VIM NOT DETECTED NOT DETECTED Final    Comment: Performed at [facility], [address], [place], [place] [address]    RADIOLOGY STUDIES/RESULTS: US  Abdomen Limited RUQ (LIVER/GB) Result Date: 04/22/2024 CLINICAL DATA:  151471 RUQ pain 151471 EXAM: ULTRASOUND ABDOMEN LIMITED RIGHT UPPER QUADRANT COMPARISON:  None Available. FINDINGS: Gallbladder: Distended up to 5 x 4.8 x 13.5 cm. Tumefactive slide and a few gallstones up to 7 mm in the lumen of the gallbladder Common bile duct: Diameter: 3.6 mm Liver: Mildly echogenic parenchyma without focal lesion. Portal vein is patent on color Doppler imaging with normal direction of blood flow towards the liver. Other: Technologist describes technically difficult study secondary to body habitus, patient immobility, patient sleeping. IMPRESSION: 1. Distended gallbladder with cholelithiasis and tumefactive sludge. 2. No biliary dilatation. 3. Mildly echogenic liver parenchyma suggesting steatosis.  Electronically Signed   By: Nicoletta Barrier M.D.   On: 04/22/2024 13:29   DG Chest Port 1V same Day Result Date: 04/21/2024 CLINICAL DATA:  [id number] Fever [id number] EXAM: PORTABLE CHEST 1 VIEW COMPARISON:  None Available. FINDINGS: The cardiomediastinal silhouette is upper limits of normal in contour. No pleural effusion. No pneumothorax. No acute pleuroparenchymal abnormality. IMPRESSION: No acute cardiopulmonary abnormality. Electronically Signed   By: Clancy Crimes M.D.   On: 04/21/2024 11:46      LOS: 5 days   Kimberly Penna, MD  [facility]    To contact the attending provider between 7A-7P or the covering provider during after hours 7P-7A, please log into the web site [email or web address] and access using universal [place] password for that web site. If you do not have the password, please call the hospital operator.  04/23/2024, 10:02 AM

## 2024-04-23 NOTE — Care Management Important Message (Signed)
 Important Message  Patient Details  Name: Tony Lopez MRN: [id number] Date of Birth: March 21, 1951   Important Message Given:  Yes - Medicare IM     Wynonia Hedges 04/23/2024, 5:12 PM

## 2024-04-23 NOTE — Progress Notes (Signed)
 Progress Note     Subjective: Pt up in chair. Able to tell me we are in [employer] and in a hospital and able to determine that we are at [facility]. Reports some RUQ pain. Consents to me speaking with his ex-wife in regards to his care.   Objective: Vital signs in last 24 hours: Temp:  [97.3 F (36.3 C)-99.2 F (37.3 C)] 97.7 F (36.5 C) (05/28 0737) Pulse Rate:  [86-212] 92 (05/28 0737) Resp:  [20-30] 26 (05/28 0737) BP: (98-132)/(52-98) 132/69 (05/28 0737) SpO2:  [91 %-95 %] 95 % (05/28 0737) Weight:  [88.3 kg] 88.3 kg (05/28 0426) Last BM Date : 04/21/24  Intake/Output from previous day: 05/27 0701 - 05/28 0700 In: 2249.6 [I.V.:1337.7; IV Piggyback:911.9] Out: 850 [Urine:850] Intake/Output this shift: Total I/O In: 544.7 [I.V.:544.7] Out: -   PE: General: WD, chronically ill appearing male who is up in chair NAD HEENT: sclera anicteric  Heart: regular, rate, and rhythm.  Lungs: Respiratory effort nonlabored Abd: soft, mildly TTP in RUQ, protuberant abdomen MS: all 4 extremities are symmetrical with no cyanosis, clubbing, or edema. Skin: warm and dry with no masses, lesions, or rashes Psych/neuro: alert more oriented today and following commands some    Lab Results:  Recent Labs    04/22/24 0329 04/23/24 0325  WBC 10.3 11.2*  HGB 8.4* 8.3*  HCT 24.2* 24.3*  PLT 122* 135*   BMET Recent Labs    04/22/24 0329 04/23/24 0325  NA 135 135  K 3.2* 3.9  CL 101 102  CO2 25 25  GLUCOSE 114* 101*  BUN 14 11  CREATININE 0.71 0.72  CALCIUM 7.8* 7.7*   PT/INR No results for input(s): "LABPROT", "INR" in the last 72 hours. CMP     Component Value Date/Time   NA 135 04/23/2024 0325   K 3.9 04/23/2024 0325   CL 102 04/23/2024 0325   CO2 25 04/23/2024 0325   GLUCOSE 101 (H) 04/23/2024 0325   BUN 11 04/23/2024 0325   CREATININE 0.72 04/23/2024 0325   CALCIUM 7.7 (L) 04/23/2024 0325   PROT 4.8 (L) 04/23/2024 0325   ALBUMIN 2.0 (L) 04/23/2024 0325   AST  45 (H) 04/23/2024 0325   ALT 40 04/23/2024 0325   ALKPHOS 123 04/23/2024 0325   BILITOT 2.1 (H) 04/23/2024 0325   GFRNONAA >60 04/23/2024 0325   GFRAA >60 10/06/2017 0501   Lipase     Component Value Date/Time   LIPASE 266 (H) 04/20/2024 0528       Studies/Results: US  Abdomen Limited RUQ (LIVER/GB) Result Date: 04/22/2024 CLINICAL DATA:  151471 RUQ pain 151471 EXAM: ULTRASOUND ABDOMEN LIMITED RIGHT UPPER QUADRANT COMPARISON:  None Available. FINDINGS: Gallbladder: Distended up to 5 x 4.8 x 13.5 cm. Tumefactive slide and a few gallstones up to 7 mm in the lumen of the gallbladder Common bile duct: Diameter: 3.6 mm Liver: Mildly echogenic parenchyma without focal lesion. Portal vein is patent on color Doppler imaging with normal direction of blood flow towards the liver. Other: Technologist describes technically difficult study secondary to body habitus, patient immobility, patient sleeping. IMPRESSION: 1. Distended gallbladder with cholelithiasis and tumefactive sludge. 2. No biliary dilatation. 3. Mildly echogenic liver parenchyma suggesting steatosis. Electronically Signed   By: Nicoletta Barrier M.D.   On: 04/22/2024 13:29    Anti-infectives: Anti-infectives (From admission, onward)    Start     Dose/Rate Route Frequency Ordered Stop   04/22/24 0400  cefTRIAXone (ROCEPHIN) 2 g in sodium chloride  0.9 %  100 mL IVPB        2 g 200 mL/hr over 30 Minutes Intravenous Every 24 hours 04/22/24 0333          Assessment/Plan  E. Coli Bacteremia  Acute cholecystitis  - CT AP 5/23 with no acute inflammatory process in abdomen or pelvis, ?lesion in L kidney of unclear etiology  - RUQ US  5/24 with biliary sludge and probable small gallstones, no evidence of acute cholecystitis, no overt cirrhosis, diffuse hepatic steatosis - RUQ US  5/27 distended gallbladder with cholelithiasis and sludge but no pericholecystic fluid or gallbladder wall thickening noted - Tbili was 6 on admit, currently 2.1,  AST/ALT in 200s on admit and now down trending - WBC 11.2, HD stable and afebrile  - HIDA today pending final read but I have reviewed and do not see any filling of the gallbladder consistent with acute cholecystitis  - I spoke with patient's ex-wife, [place], I let her know HIDA findings and we discussed operative vs non-operative management of cholecystitis. I think it is appropriate at this time to consider cholecystectomy. I have explained the procedure, risks, and aftercare of Laparoscopic cholecystectomy.  Risks include but are not limited to anesthesia (MI, CVA, death, prolonged intubation and aspiration), bleeding, infection, wound problems, hernia, bile leak, injury to common bile duct/liver/intestine, possible need for subtotal cholecystectomy or open cholecystectomy, increased risk of DVT/PE and diarrhea post op.  Landa Pine  seems to understand and agrees to proceed.   FEN: CLD, NPO after MN tomorrow for possible OR  VTE: SCDs ID: Rocephin   - per [facility] -  Acute alcohol withdrawal with DTs AKI, resolved Chronic thrombocytopenia - plts 135K Left renal lesion - outpatient follow up imaging recommended  Tobacco abuse   LOS: 5 days   I reviewed hospitalist notes, last 24 h vitals and pain scores, last 48 h intake and output, last 24 h labs and trends, and last 24 h imaging results.  This care required high  level of medical decision making.    Annetta Killian, PA-C  [facility] 04/23/2024, 1:30 PM Please see Amion for pager number during day hours 7:00am-4:30pm

## 2024-04-23 NOTE — Progress Notes (Signed)
 Physical Therapy Treatment Patient Details Name: Tony Lopez MRN: [id number] DOB: 06/23/1951 Today's Date: 04/23/2024   History of Present Illness Pt is a 73 y/o male presenting on 5/23 with ETOH withdrawal with DTs. Complicated by tachycardia, hyponatermia, and AKI. PMH includes: arthritis, Hep C, HTN, prostate cancer.    PT Comments  Pt is slowly progressing towards goals and improving in functional mobility. Pt continues to require multi modal cues for initiation of movement. Currently pt is Max A for bed mobility and 2 person Max A for sit to stand/transfers. Pt had difficulty with RW due to difficulty navigating. Due to pt current functional status, home set up and available assistance at home recommending skilled physical therapy services < 3 hours/day in order to address strength, balance and functional mobility to decrease risk for falls, injury, immobility, skin break down and re-hospitalization.     If plan is discharge home, recommend the following: Two people to help with walking and/or transfers;Assistance with cooking/housework;Assist for transportation;Help with stairs or ramp for entrance   Can travel by private vehicle     No  Equipment Recommendations  Hospital bed;Wheelchair cushion (measurements PT);Wheelchair (measurements PT);BSC/3in1       Precautions / Restrictions Precautions Precautions: Fall Recall of Precautions/Restrictions: Impaired Restrictions Weight Bearing Restrictions Per Provider Order: No     Mobility  Bed Mobility Overal bed mobility: Needs Assistance Bed Mobility: Supine to Sit     Supine to sit: Max assist     General bed mobility comments: Max A with step by step multi modal cues including getting legs to EOB and reaching for rail then pushing up to mid line.    Transfers Overall transfer level: Needs assistance Equipment used: Rolling walker (2 wheels), 2 person hand held assist Transfers: Sit to/from Stand, Bed to  chair/wheelchair/BSC Sit to Stand: Max assist, +2 physical assistance, +2 safety/equipment   Step pivot transfers: +2 physical assistance, +2 safety/equipment, Max assist       General transfer comment: multi modal cues for each step, attempted with stand with RW with poor body mechanics and ability to navigate AD. Performed stand pivot transfer with 2 person assist with multi modal cues for reaching for contralateral arm rest on chair with 90% ability to follow commands for safe transfers, Very short steps with reciprocal pattern, low foot clearance.    Ambulation/Gait   General Gait Details: deferred at this time due to pt current functional status        Balance Overall balance assessment: Needs assistance Sitting-balance support: No upper extremity supported, Feet supported, Single extremity supported Sitting balance-Leahy Scale: Poor Sitting balance - Comments: Pt able to sit EOB with CGA to occasional MIn A   Standing balance support: Bilateral upper extremity supported, Reliant on assistive device for balance, During functional activity Standing balance-Leahy Scale: Poor Standing balance comment: Requires external support        Communication Communication Communication: Impaired Factors Affecting Communication: Reduced clarity of speech  Cognition Arousal: Lethargic, Alert Behavior During Therapy: Flat affect   PT - Cognitive impairments: No family/caregiver present to determine baseline       PT - Cognition Comments: Pt only briefly holds eyes opens.  Does follow some simple commands with increased time, multimodal cues Following commands: Impaired Following commands impaired: Follows one step commands inconsistently, Follows one step commands with increased time    Cueing Cueing Techniques: Verbal cues, Tactile cues, Visual cues     General Comments General comments (skin integrity, edema, etc.): vital  signs stable on room air. Nursing assisting with  mobilizing pt for bathing and nursing asissted as +2      Pertinent Vitals/Pain Pain Assessment Pain Assessment: Faces Faces Pain Scale: No hurt Breathing: normal Negative Vocalization: none Facial Expression: smiling or inexpressive Body Language: relaxed Consolability: no need to console PAINAD Score: 0 Pain Intervention(s): Monitored during session     PT Goals (current goals can now be found in the care plan section) Acute Rehab PT Goals Patient Stated Goal: unable to state PT Goal Formulation: Patient unable to participate in goal setting Time For Goal Achievement: 05/05/24 Potential to Achieve Goals: Fair Progress towards PT goals: Progressing toward goals    Frequency    Min 2X/week      PT Plan  Continue with current POC        AM-PAC PT "6 Clicks" Mobility   Outcome Measure  Help needed turning from your back to your side while in a flat bed without using bedrails?: A Lot Help needed moving from lying on your back to sitting on the side of a flat bed without using bedrails?: A Lot Help needed moving to and from a bed to a chair (including a wheelchair)?: Total Help needed standing up from a chair using your arms (e.g., wheelchair or bedside chair)?: Total Help needed to walk in hospital room?: Total Help needed climbing 3-5 steps with a railing? : Total 6 Click Score: 8    End of Session Equipment Utilized During Treatment: Gait belt Activity Tolerance: Patient tolerated treatment well;Patient limited by fatigue Patient left: with call bell/phone within reach;in chair;with chair alarm set Nurse Communication: Mobility status PT Visit Diagnosis: Muscle weakness (generalized) (M62.81);Other abnormalities of gait and mobility (R26.89)     Time: 4098-1191 PT Time Calculation (min) (ACUTE ONLY): 16 min  Charges:    $Therapeutic Activity: 8-22 mins PT General Charges $$ ACUTE PT VISIT: 1 Visit                    Sloan Duncans, DPT, CLT  Acute  Rehabilitation Services Office: [phone] (Secure chat preferred)    Jenice Mitts 04/23/2024, 3:55 PM

## 2024-04-24 DIAGNOSIS — K922 Gastrointestinal hemorrhage, unspecified: Secondary | ICD-10-CM | POA: Diagnosis not present

## 2024-04-24 DIAGNOSIS — F10931 Alcohol use, unspecified with withdrawal delirium: Secondary | ICD-10-CM | POA: Diagnosis not present

## 2024-04-24 DIAGNOSIS — K7010 Alcoholic hepatitis without ascites: Secondary | ICD-10-CM | POA: Diagnosis not present

## 2024-04-24 DIAGNOSIS — N179 Acute kidney failure, unspecified: Secondary | ICD-10-CM | POA: Diagnosis not present

## 2024-04-24 LAB — CBC
HCT: 22.4 % — ABNORMAL LOW (ref 39.0–52.0)
Hemoglobin: 7.5 g/dL — ABNORMAL LOW (ref 13.0–17.0)
MCH: 32.8 pg (ref 26.0–34.0)
MCHC: 33.5 g/dL (ref 30.0–36.0)
MCV: 97.8 fL (ref 80.0–100.0)
Platelets: 136 10*3/uL — ABNORMAL LOW (ref 150–400)
RBC: 2.29 MIL/uL — ABNORMAL LOW (ref 4.22–5.81)
RDW: 15.6 % — ABNORMAL HIGH (ref 11.5–15.5)
WBC: 12.5 10*3/uL — ABNORMAL HIGH (ref 4.0–10.5)
nRBC: 0.2 % (ref 0.0–0.2)

## 2024-04-24 LAB — PHOSPHORUS: Phosphorus: 2.7 mg/dL (ref 2.5–4.6)

## 2024-04-24 LAB — COMPREHENSIVE METABOLIC PANEL WITH GFR
ALT: 33 U/L (ref 0–44)
AST: 36 U/L (ref 15–41)
Albumin: 1.8 g/dL — ABNORMAL LOW (ref 3.5–5.0)
Alkaline Phosphatase: 123 U/L (ref 38–126)
Anion gap: 10 (ref 5–15)
BUN: 12 mg/dL (ref 8–23)
CO2: 23 mmol/L (ref 22–32)
Calcium: 7.7 mg/dL — ABNORMAL LOW (ref 8.9–10.3)
Chloride: 106 mmol/L (ref 98–111)
Creatinine, Ser: 0.83 mg/dL (ref 0.61–1.24)
GFR, Estimated: >60 mL/min (ref >60)
Glucose, Bld: 99 mg/dL (ref 70–99)
Potassium: 3.6 mmol/L (ref 3.5–5.1)
Sodium: 139 mmol/L (ref 135–145)
Total Bilirubin: 2.1 mg/dL — ABNORMAL HIGH (ref 0.0–1.2)
Total Protein: 4.7 g/dL — ABNORMAL LOW (ref 6.5–8.1)

## 2024-04-24 LAB — MAGNESIUM: Magnesium: 1.8 mg/dL (ref 1.7–2.4)

## 2024-04-24 MED ORDER — IPRATROPIUM BROMIDE 0.02 % IN SOLN
0.5000 mg | Freq: Two times a day (BID) | RESPIRATORY_TRACT | Status: DC
  Administered 2024-04-24 – 2024-05-02 (×15): 0.5 mg via RESPIRATORY_TRACT
  Filled 2024-04-24 (×15): qty 2.5

## 2024-04-24 MED ORDER — LEVALBUTEROL HCL 0.63 MG/3ML IN NEBU
0.6300 mg | INHALATION_SOLUTION | Freq: Two times a day (BID) | RESPIRATORY_TRACT | Status: DC
  Administered 2024-04-24 – 2024-05-02 (×15): 0.63 mg via RESPIRATORY_TRACT
  Filled 2024-04-24 (×15): qty 3

## 2024-04-24 MED ORDER — ACETAMINOPHEN 325 MG PO TABS
650.0000 mg | ORAL_TABLET | Freq: Four times a day (QID) | ORAL | Status: DC | PRN
  Administered 2024-04-24: 650 mg via ORAL
  Filled 2024-04-24 (×2): qty 2

## 2024-04-24 MED ORDER — ENSURE PLUS HIGH PROTEIN PO LIQD
237.0000 mL | Freq: Two times a day (BID) | ORAL | Status: DC
  Administered 2024-04-24 – 2024-05-02 (×6): 237 mL via ORAL

## 2024-04-24 NOTE — Progress Notes (Signed)
 Speech Language Pathology Treatment: Dysphagia  Patient Details Name: Tony Lopez MRN: [id number] DOB: 1951/08/07 Today's Date: 04/24/2024 Time: 0820-0831 SLP Time Calculation (min) (ACUTE ONLY): 11 min  Assessment / Plan / Recommendation Clinical Impression  Pt's mentation has improved from prior session. He was cooperative, knows he is in the hospital, upper denture plate donned. Pt able to attend to po's, use straw, masticate regular texture. There were no signs of aspiration with multiple sips thin via straw. Mastication was timely without residue over multiple trials regular texture. Pt's diet had been upgraded to regular prior and he can continue this diet- make sure dentures are donned. He can likely feed himself but may need assist with set up. Pills whole with liquid. No further ST needed.    HPI HPI: Patient is a 73 y.o.  male with history of HTN, prostate cancer, GERD, EtOH use-who was referred from [facility] to ED for alcohol withdrawal symptoms      SLP Plan  All goals met;Discharge SLP treatment due to (comment)      Recommendations for follow up therapy are one component of a multi-disciplinary discharge planning process, led by the attending physician.  Recommendations may be updated based on patient status, additional functional criteria and insurance authorization.    Recommendations  Diet recommendations: Regular;Thin liquid Liquids provided via: Straw;Cup Medication Administration: Whole meds with liquid Supervision: Patient able to self feed Compensations: Slow rate;Small sips/bites Postural Changes and/or Swallow Maneuvers: Seated upright 90 degrees                  Oral care BID     Dysphagia, unspecified (R13.10)     All goals met;Discharge SLP treatment due to (comment)     Naomia Bachelor  04/24/2024, 8:39 AM

## 2024-04-24 NOTE — Progress Notes (Signed)
 Progress Note     Subjective: No new complaints except he has had a BM in bed.  Objective: Vital signs in last 24 hours: Temp:  [97.1 F (36.2 C)-99.4 F (37.4 C)] 98.8 F (37.1 C) (05/29 0756) Pulse Rate:  [90-109] 108 (05/29 0800) Resp:  [18-27] 20 (05/29 0800) BP: (119-147)/(63-87) 136/86 (05/29 0800) SpO2:  [90 %-100 %] 100 % (05/29 0800) Last BM Date : 04/21/24  Intake/Output from previous day: 05/28 0701 - 05/29 0700 In: 744.7 [I.V.:544.7; IV Piggyback:200] Out: 750 [Urine:750] Intake/Output this shift: No intake/output data recorded.  PE: Heart: regular, rate, and rhythm.  Lungs: Respiratory effort nonlabored Abd: soft, mildly TTP in RUQ, protuberant abdomen  Lab Results:  Recent Labs    04/23/24 0325 04/24/24 0451  WBC 11.2* 12.5*  HGB 8.3* 7.5*  HCT 24.3* 22.4*  PLT 135* 136*   BMET Recent Labs    04/23/24 0325 04/24/24 0451  NA 135 139  K 3.9 3.6  CL 102 106  CO2 25 23  GLUCOSE 101* 99  BUN 11 12  CREATININE 0.72 0.83  CALCIUM 7.7* 7.7*   PT/INR No results for input(s): "LABPROT", "INR" in the last 72 hours. CMP     Component Value Date/Time   NA 139 04/24/2024 0451   K 3.6 04/24/2024 0451   CL 106 04/24/2024 0451   CO2 23 04/24/2024 0451   GLUCOSE 99 04/24/2024 0451   BUN 12 04/24/2024 0451   CREATININE 0.83 04/24/2024 0451   CALCIUM 7.7 (L) 04/24/2024 0451   PROT 4.7 (L) 04/24/2024 0451   ALBUMIN 1.8 (L) 04/24/2024 0451   AST 36 04/24/2024 0451   ALT 33 04/24/2024 0451   ALKPHOS 123 04/24/2024 0451   BILITOT 2.1 (H) 04/24/2024 0451   GFRNONAA >60 04/24/2024 0451   GFRAA >60 10/06/2017 0501   Lipase     Component Value Date/Time   LIPASE 266 (H) 04/20/2024 0528       Studies/Results: NM Hepatobiliary Liver Func Result Date: 04/23/2024 CLINICAL DATA:  Right upper quadrant abdominal pain concern for acalculous cholecystitis. EXAM: NUCLEAR MEDICINE HEPATOBILIARY IMAGING TECHNIQUE: Sequential images of the abdomen were  obtained out to 60 minutes following intravenous administration of radiopharmaceutical. RADIOPHARMACEUTICALS:  7.5 mCi Tc-5m  Choletec IV COMPARISON:  Ultrasound Apr 22, 2024 FINDINGS: Prompt uptake and biliary excretion of activity by the liver is seen. Gallbladder activity is not visualized. Biliary activity passes into small bowel, consistent with patent common bile duct. IMPRESSION: Non visualization of the gallbladder compatible with occluded cystic duct/cholecystitis. Electronically Signed   By: Tama Fails M.D.   On: 04/23/2024 15:39    Anti-infectives: Anti-infectives (From admission, onward)    Start     Dose/Rate Route Frequency Ordered Stop   04/23/24 1430  ceFEPIme (MAXIPIME) 2 g in sodium chloride  0.9 % 100 mL IVPB        2 g 200 mL/hr over 30 Minutes Intravenous Every 8 hours 04/23/24 1337     04/22/24 0400  cefTRIAXone (ROCEPHIN) 2 g in sodium chloride  0.9 % 100 mL IVPB  Status:  Discontinued        2 g 200 mL/hr over 30 Minutes Intravenous Every 24 hours 04/22/24 0333 04/23/24 1337        Assessment/Plan  E. Coli Bacteremia  Acute cholecystitis  - HIDA positive for cholecystitis -unable to do surgery today, plan for diet today and NPO p MN for OR tomorrow.  FEN: HH, NPO after MN for OR tomorrow VTE: SCDs  ID: Rocephin   - per TRH -  Acute alcohol withdrawal with DTs AKI, resolved Chronic thrombocytopenia - plts 135K Left renal lesion - outpatient follow up imaging recommended  Tobacco abuse   LOS: 6 days   I reviewed hospitalist notes, last 24 h vitals and pain scores, last 48 h intake and output, last 24 h labs and trends, and last 24 h imaging results.   Leone Ralphs, [facility] 04/24/2024, 10:52 AM Please see Amion for pager number during day hours 7:00am-4:30pm

## 2024-04-24 NOTE — Progress Notes (Addendum)
 Occupational Therapy Treatment Patient Details Name: Tony Lopez MRN: [id number] DOB: Aug 30, 1951 Today's Date: 04/24/2024   History of present illness Pt is a 73 y/o male presenting on 5/23 with ETOH withdrawal with DTs. Complicated by tachycardia, hyponatermia, and AKI. PMH includes: arthritis, Hep C, HTN, prostate cancer.   OT comments  Pt making slow but gradual progress towards OT goals. Pt remains lethargic but awakens with stimuli. Pt continues to require extensive +2 assist for bed mobility but able to maintain balance once EOB. With +2 assist, pt able to transfer OOB to recliner (lift pad placed in chair) and assist with basic grooming tasks with overall Mod A. Continue to recommend continued inpatient follow up therapy, <3 hours/day at DC as pt is significantly below functional baseline and at high risk for falls.   HR low 100s Spo2 92-94% on RA      If plan is discharge home, recommend the following:  Two people to help with walking and/or transfers;Two people to help with bathing/dressing/bathroom;Assistance with cooking/housework;Supervision due to cognitive status;Help with stairs or ramp for entrance;Assist for transportation;Direct supervision/assist for financial management;Direct supervision/assist for medications management;Assistance with feeding   Equipment Recommendations  Other (comment) (TBD)    Recommendations for Other Services      Precautions / Restrictions Precautions Precautions: Fall Recall of Precautions/Restrictions: Impaired Restrictions Weight Bearing Restrictions Per Provider Order: No       Mobility Bed Mobility Overal bed mobility: Needs Assistance Bed Mobility: Supine to Sit, Rolling Rolling: Max assist   Supine to sit: Max assist, +2 for safety/equipment, +2 for physical assistance     General bed mobility comments: assist to initiate rolling to remove bedpan, heavy Max A x 2 to sit EOB w/ poor initiation from pt     Transfers Overall transfer level: Needs assistance Equipment used: 2 person hand held assist Transfers: Sit to/from Stand, Bed to chair/wheelchair/BSC Sit to Stand: Mod assist, +2 physical assistance, +2 safety/equipment, From elevated surface Stand pivot transfers: Max assist, +2 physical assistance, +2 safety/equipment         General transfer comment: Pt able to assist to lift bottom but required knee blocking to stand fully upright. Stood twice and on second attempt, used bed pad and gait belt for transfer to recliner. lift pad placed in chair for safe transfer back to bed when ready     Balance Overall balance assessment: Needs assistance Sitting-balance support: No upper extremity supported, Feet supported, Single extremity supported Sitting balance-Leahy Scale: Fair Sitting balance - Comments: able to statically sit without support   Standing balance support: Bilateral upper extremity supported, Reliant on assistive device for balance, During functional activity Standing balance-Leahy Scale: Poor                             ADL either performed or assessed with clinical judgement   ADL Overall ADL's : Needs assistance/impaired Eating/Feeding: Set up;Sitting Eating/Feeding Details (indicate cue type and reason): to drink from cup in chair Grooming: Moderate assistance;Sitting;Wash/dry face;Brushing hair Grooming Details (indicate cue type and reason): assist to bring washcloth to face with hand over hand. Pt able to comb front sides of hair but unable to reach top and assist needed for back                                    Extremity/Trunk Assessment Upper Extremity Assessment  Upper Extremity Assessment: Generalized weakness;Right hand dominant   Lower Extremity Assessment Lower Extremity Assessment: Defer to PT evaluation        Vision   Vision Assessment?: No apparent visual deficits   Perception     Praxis     Communication  Communication Communication: Impaired Factors Affecting Communication: Reduced clarity of speech   Cognition Arousal: Lethargic, Alert Behavior During Therapy: Flat affect Cognition: Cognition impaired   Orientation impairments: Place, Time, Situation Awareness: Intellectual awareness impaired, Online awareness impaired Memory impairment (select all impairments): Short-term memory, Working [employer] fast streamer, Engineer, structural memory Attention impairment (select first level of impairment): Sustained attention Executive functioning impairment (select all impairments): Initiation, Organization, Sequencing, Reasoning, Problem solving OT - Cognition Comments: flat affect, lethargic but easily awakens to stimuli. unaware of where he was despite given choices, slow to initiate and follow commands.                 Following commands: Impaired Following commands impaired: Follows one step commands inconsistently, Follows one step commands with increased time      Cueing   Cueing Techniques: Verbal cues, Tactile cues, Visual cues  Exercises      Shoulder Instructions       General Comments HR low 100s    Pertinent Vitals/ Pain       Pain Assessment Pain Assessment: No/denies pain  Home Living                                          Prior Functioning/Environment              Frequency  Min 2X/week        Progress Toward Goals  OT Goals(current goals can now be found in the care plan section)  Progress towards OT goals: OT to reassess next treatment  Acute Rehab OT Goals Patient Stated Goal: none stated OT Goal Formulation: Patient unable to participate in goal setting Time For Goal Achievement: 05/05/24 Potential to Achieve Goals: Fair ADL Goals Pt Will Perform Grooming: with min assist;sitting Pt Will Perform Upper Body Bathing: with min assist;sitting Pt Will Transfer to Toilet: with max assist;with +2 assist;bedside commode Additional ADL  Goal #1: Pt will completed bed mobility with mod assist. Additional ADL Goal #2: Pt will attend to and follow 1 step commands with 90% accuracy.  Plan      Co-evaluation                 AM-PAC OT "6 Clicks" Daily Activity     Outcome Measure   Help from another person eating meals?: A Little Help from another person taking care of personal grooming?: A Lot Help from another person toileting, which includes using toliet, bedpan, or urinal?: Total Help from another person bathing (including washing, rinsing, drying)?: A Lot Help from another person to put on and taking off regular upper body clothing?: A Lot Help from another person to put on and taking off regular lower body clothing?: Total 6 Click Score: 11    End of Session Equipment Utilized During Treatment: Gait belt  OT Visit Diagnosis: Other abnormalities of gait and mobility (R26.89);Muscle weakness (generalized) (M62.81);Other symptoms and signs involving cognitive function   Activity Tolerance Patient limited by lethargy   Patient Left in chair;with call bell/phone within reach;with chair alarm set   Nurse Communication Mobility status;Need for lift equipment;Other (comment) (discussed with NT;  who also when to place mitts back on pt (were not on on OT entry))        Time: 1034-1059 OT Time Calculation (min): 25 min  Charges: OT General Charges $OT Visit: 1 Visit OT Treatments $Self Care/Home Management : 8-22 mins $Therapeutic Activity: 8-22 mins  Lawrence Pretty, OTR/L Acute Rehab Services Office: [phone]   Shireen Dory 04/24/2024, 12:53 PM

## 2024-04-24 NOTE — Plan of Care (Signed)
  Problem: Education: Goal: Knowledge of General Education information will improve Description: Including pain rating scale, medication(s)/side effects and non-pharmacologic comfort measures Outcome: Progressing   Problem: Health Behavior/Discharge Planning: Goal: Ability to manage health-related needs will improve Outcome: Progressing   Problem: Activity: Goal: Risk for activity intolerance will decrease Outcome: Progressing   Problem: Coping: Goal: Level of anxiety will decrease Outcome: Progressing   Problem: Elimination: Goal: Will not experience complications related to bowel motility Outcome: Progressing   

## 2024-04-24 NOTE — Progress Notes (Signed)
 PROGRESS NOTE        PATIENT DETAILS Name: Tony Lopez Age: 73 y.o. Sex: male Date of Birth: [birth year] Admit Date: 04/18/2024 Admitting Physician [name], MD BJY:NWGNFA, [name], [name]  Brief Summary: Patient is a 73 y.o.  male with history of HTN, prostate cancer, GERD, EtOH use-who was admitted for alcohol withdrawal with delirium tremens.  Hospital course complicated by gram-negative bacteremia and acute calculus cholecystitis.  Significant events: 5/23>> admit to [facility]. 5/26>>Fever-blood culture- gm neg rod/gm +ve rod 5/27>> RUQ tender-CCS consulted. 5/28>> HIDA scan +ve  Significant studies: 5/23>> CT head: No acute intracranial abnormality 5/23>> CT abdomen/pelvis: No acute abnormality. 5/24>> RUQ ultrasound: Biliary sludge/small gallstones.  No evidence of acute cholecystitis. 5/27>> RUQ ultrasound: Distended gallbladder with cholelithiasis/sludge. 5/28>> HIDA scan: Nonvisualization of the gallbladder  Significant microbiology data: 5/25>> GI pathogen panel: Negative 5/26>> blood culture: E. coli/Morganella/gram-positive cocci (BCID staph epi/E. coli-suspect staph epi is a contamination) 5/28>> blood cultures: No growth  Procedures: None  Consults: GI CCS  Subjective: Frail but much more awake and alert compared to yesterday.  Continues to have RUQ pain although mild.  Objective: Vitals: Blood pressure (!) 98/56, pulse 84, temperature (!) 101.3 F (38.5 C), temperature source Axillary, resp. rate (!) 24, height 6' (1.829 m), weight 88.3 kg, SpO2 96%.   Exam: Much more awake and alert today Chest: Some scattered wheezing but moving air well bilaterally CVS: S1-S2 regular Abdomen: Soft-mild RUQ tenderness with guarding. Extremity: No edema Nonfocal but has generalized weakness    Pertinent Labs/Radiology:    Latest Ref Rng & Units 04/24/2024    4:51 AM 04/23/2024    3:25 AM 04/22/2024    3:29 AM  CBC  WBC 4.0 - 10.5  K/uL 12.5  11.2  10.3   Hemoglobin 13.0 - 17.0 g/dL 7.5  8.3  8.4   Hematocrit 39.0 - 52.0 % 22.4  24.3  24.2   Platelets 150 - 400 K/uL 136  135  122     Lab Results  Component Value Date   NA 139 04/24/2024   K 3.6 04/24/2024   CL 106 04/24/2024   CO2 23 04/24/2024      Assessment/Plan: EtOH withdrawal with DTs Per psych NP documentation-last drink was on 5/22 (1 quart of whiskey will last every 2 days)  Significantly better with Ativan per CIWA protocol which she has completed He is much more awake and alert today compared to the past several days He should be almost out of the window for any further withdrawal symptoms Continue supportive care.   Sinus tachycardia with PACs Secondary to DTs Tachycardia has essentially resolved-with treatment of underlying DTs-and with initiation of low-dose beta-blockers.  E. coli/Morganella bacteremia with acute calculus cholecystitis Likely secondary to biliary source-likely acute calculus cholecystitis Repeat cultures negative CCS following with plans to proceed with laparoscopic cholecystectomy 5/30. Continue IV cefepime.    Note-Gram-positive cocci in blood cultures is likely a contamination  AKI Hemodynamically mediated-poor oral intake/use of lisinopril /HCTZ Resolved with supportive care.  Hyponatremia Secondary to dehydration/poor oral intake/HCTZ use Resolved.  Diarrhea Prn immodium GI pathogen panel negative Thankfully diarrhea has resolved.  Elevated lipase level Initially not felt to have abdominal pain-no evidence of pancreatitis on CT However on 5/27-RUQ pain-see above regarding plans for repeat RUQ ultrasound given gram-negative bacteremia.    EtOH hepatitis Transaminases have essentially  normalized with supportive care Per GI note-no indication for steroid as discriminant function I<10.  Thrombocytopenia Secondary to EtOH use Continues to have mild thrombocytopenia-but this slowly improving. No evidence of  bleeding  Normocytic anemia Probably related to EtOH related bone marrow suppression No evidence of acute blood loss-although FOBT positive Trend CBC for now.  Guaiac positive stool Outpatient GI follow-up-as no evidence of active bleeding.  Hb stable.  Cholelithiasis Initially felt to be asymptomatic-but now having fever and RUQ tenderness-see above.  1.6 x 2.4 cm structure arising from left kidney Incidental finding on CT abdomen. Per radiology-favored to represent proteinaceous/hemorrhagic cyst Will need outpatient imaging postdischarge.  Tobacco abuse Transdermal nicotine Some wheezing today-longstanding history-unclear if he has underlying COPD-starting bronchodilators and seeing how he does.  BMI: Estimated body mass index is 26.4 kg/m as calculated from the following:   Height as of this encounter: 6' (1.829 m).   Weight as of this encounter: 88.3 kg.   Code status:   Code Status: Full Code   DVT Prophylaxis: SCDs Start: 04/18/24 2024   Family Communication: Ex wife-Nikki Saindon [phone] updated over the phone 5/27   Disposition Plan: Status is: Inpatient Remains inpatient appropriate because: Severity of illness   Planned Discharge Destination: SNF   Diet: Diet Order             Diet NPO time specified Except for: Sips with Meds  Diet effective midnight           Diet Heart Room service appropriate? Yes; Fluid consistency: Thin  Diet effective now                     Antimicrobial agents: Anti-infectives (From admission, onward)    Start     Dose/Rate Route Frequency Ordered Stop   04/23/24 1430  ceFEPIme (MAXIPIME) 2 g in sodium chloride  0.9 % 100 mL IVPB        2 g 200 mL/hr over 30 Minutes Intravenous Every 8 hours 04/23/24 1337     04/22/24 0400  cefTRIAXone (ROCEPHIN) 2 g in sodium chloride  0.9 % 100 mL IVPB  Status:  Discontinued        2 g 200 mL/hr over 30 Minutes Intravenous Every 24 hours 04/22/24 0333 04/23/24 1337         MEDICATIONS: Scheduled Meds:  allopurinol  100 mg Oral Daily   feeding supplement  237 mL Oral BID BM   folic acid  1 mg Oral Daily   ipratropium  0.5 mg Nebulization Q8H   levalbuterol  0.63 mg Nebulization Q8H   lidocaine   1 patch Transdermal Q24H   metoprolol tartrate  25 mg Oral BID   multivitamin with minerals  1 tablet Oral Daily   nicotine  21 mg Transdermal Daily   pantoprazole (PROTONIX) IV  40 mg Intravenous Q12H   thiamine  100 mg Oral Daily   Or   thiamine  100 mg Intravenous Daily   Continuous Infusions:  ceFEPime (MAXIPIME) IV 2 g (04/24/24 0531)   PRN Meds:.loperamide, LORazepam, ondansetron  **OR** ondansetron  (ZOFRAN ) IV   I have personally reviewed following labs and imaging studies  LABORATORY DATA: CBC: Recent Labs  Lab 04/18/24 1354 04/18/24 2128 04/20/24 1846 04/21/24 0332 04/22/24 0329 04/23/24 0325 04/24/24 0451  WBC 10.7*   < > 9.6 10.1 10.3 11.2* 12.5*  NEUTROABS 9.1*  --   --   --   --   --   --   HGB 11.1*   < >  9.7* 8.6* 8.4* 8.3* 7.5*  HCT 31.3*   < > 27.1* 24.3* 24.2* 24.3* 22.4*  MCV 94.8   < > 94.1 94.2 95.7 97.2 97.8  PLT 66*   < > 87* 95* 122* 135* 136*   < > = values in this interval not displayed.    Basic Metabolic Panel: Recent Labs  Lab 04/20/24 0528 04/21/24 0332 04/21/24 1343 04/22/24 0329 04/23/24 0325 04/24/24 0451  NA 132* 133* 134* 135 135 139  K 3.2* 3.0* 3.7 3.2* 3.9 3.6  CL 92* 96* 98 101 102 106  CO2 23 26 26 25 25 23   GLUCOSE 111* 138* 152* 114* 101* 99  BUN 25* 21 18 14 11 12   CREATININE 1.25* 0.90 0.80 0.71 0.72 0.83  CALCIUM 8.6* 8.4* 8.2* 7.8* 7.7* 7.7*  MG 1.4* 1.8  --  1.6* 1.9 1.8  PHOS  --  <1.0* 2.0*  --  3.4 2.7    GFR: Estimated Creatinine Clearance: 89.6 mL/min (by C-G formula based on SCr of 0.83 mg/dL).  Liver Function Tests: Recent Labs  Lab 04/20/24 0528 04/21/24 0332 04/22/24 0329 04/23/24 0325 04/24/24 0451  AST 128* 82* 64* 45* 36  ALT 88* 65* 54* 40 33  ALKPHOS  123 109 118 123 123  BILITOT 4.6* 3.1* 2.5* 2.1* 2.1*  PROT 5.4* 5.0* 4.9* 4.8* 4.7*  ALBUMIN 2.7* 2.4* 2.2* 2.0* 1.8*   Recent Labs  Lab 04/18/24 1354 04/20/24 0528  LIPASE 253* 266*   No results for input(s): "AMMONIA" in the last 168 hours.  Coagulation Profile: Recent Labs  Lab 04/18/24 1437  INR 0.9    Cardiac Enzymes: No results for input(s): "CKTOTAL", "CKMB", "CKMBINDEX", "TROPONINI" in the last 168 hours.  BNP (last 3 results) No results for input(s): "PROBNP" in the last 8760 hours.  Lipid Profile: No results for input(s): "CHOL", "HDL", "LDLCALC", "TRIG", "CHOLHDL", "LDLDIRECT" in the last 72 hours.  Thyroid Function Tests: No results for input(s): "TSH", "T4TOTAL", "FREET4", "T3FREE", "THYROIDAB" in the last 72 hours.  Anemia Panel: No results for input(s): "VITAMINB12", "FOLATE", "FERRITIN", "TIBC", "IRON", "RETICCTPCT" in the last 72 hours.  Urine analysis:    Component Value Date/Time   COLORURINE AMBER (A) 04/21/2024 1114   APPEARANCEUR CLOUDY (A) 04/21/2024 1114   LABSPEC 1.027 04/21/2024 1114   PHURINE 5.0 04/21/2024 1114   GLUCOSEU NEGATIVE 04/21/2024 1114   HGBUR MODERATE (A) 04/21/2024 1114   BILIRUBINUR NEGATIVE 04/21/2024 1114   KETONESUR 5 (A) 04/21/2024 1114   PROTEINUR 30 (A) 04/21/2024 1114   NITRITE NEGATIVE 04/21/2024 1114   LEUKOCYTESUR NEGATIVE 04/21/2024 1114    Sepsis Labs: Lactic Acid, Venous No results found for: "LATICACIDVEN"  MICROBIOLOGY: Recent Results (from the past 240 hours)  Gastrointestinal Panel by PCR , Stool     Status: None   Collection Time: 04/20/24  6:39 PM   Specimen: Stool  Result Value Ref Range Status   Campylobacter species NOT DETECTED NOT DETECTED Final   Plesimonas shigelloides NOT DETECTED NOT DETECTED Final   Salmonella species NOT DETECTED NOT DETECTED Final   Yersinia enterocolitica NOT DETECTED NOT DETECTED Final   Vibrio species NOT DETECTED NOT DETECTED Final   Vibrio cholerae NOT  DETECTED NOT DETECTED Final   Enteroaggregative E coli (EAEC) NOT DETECTED NOT DETECTED Final   Enteropathogenic E coli (EPEC) NOT DETECTED NOT DETECTED Final   Enterotoxigenic E coli (ETEC) NOT DETECTED NOT DETECTED Final   Shiga like toxin producing E coli (STEC) NOT DETECTED NOT DETECTED Final  Shigella/Enteroinvasive E coli (EIEC) NOT DETECTED NOT DETECTED Final   Cryptosporidium NOT DETECTED NOT DETECTED Final   Cyclospora cayetanensis NOT DETECTED NOT DETECTED Final   Entamoeba histolytica NOT DETECTED NOT DETECTED Final   Giardia lamblia NOT DETECTED NOT DETECTED Final   Adenovirus F40/41 NOT DETECTED NOT DETECTED Final   Astrovirus NOT DETECTED NOT DETECTED Final   Norovirus GI/GII NOT DETECTED NOT DETECTED Final   Rotavirus A NOT DETECTED NOT DETECTED Final   Sapovirus (I, II, IV, and V) NOT DETECTED NOT DETECTED Final    Comment: Performed at [facility], [address] [place], [place] [address]  Culture, blood (Routine X 2) w Reflex to ID Panel     Status: Abnormal (Preliminary result)   Collection Time: 04/21/24 12:02 PM   Specimen: BLOOD LEFT HAND  Result Value Ref Range Status   Specimen Description BLOOD LEFT HAND  Final   Special Requests   Final    BOTTLES DRAWN AEROBIC AND ANAEROBIC Blood Culture adequate volume   Culture  Setup Time   Final    GRAM NEGATIVE RODS GRAM POSITIVE COCCI ANAEROBIC BOTTLE ONLY GRAM NEGATIVE RODS AEROBIC BOTTLE ONLY CRITICAL RESULT CALLED TO, READ BACK BY AND VERIFIED WITH: J [facility]  04/22/24 MK    Culture (A)  Final    ESCHERICHIA COLI GRAM POSITIVE COCCI CULTURE REINCUBATED FOR BETTER GROWTH    Report Status PENDING  Incomplete   Organism ID, Bacteria ESCHERICHIA COLI  Final   Organism ID, Bacteria ESCHERICHIA COLI  Final      Susceptibility   Escherichia coli - KIRBY BAUER*    CEFAZOLIN  Value in next row Sensitive      SENSITIVEPerformed at [facility], [address] [place], [place] [address]    Escherichia coli - MIC*    AMPICILLIN Value in next row Sensitive      SENSITIVEPerformed at [facility], [address] [place], [place] [address]    CEFEPIME Value in next row Sensitive      SENSITIVEPerformed at [facility], [address] [place], [place] [address]    CEFTAZIDIME Value in next row Sensitive      SENSITIVEPerformed at [facility], [address], [place], [place] [address]    CEFTRIAXONE Value in next row Sensitive      SENSITIVEPerformed at [facility], [address] [place], [place] [address]    CIPROFLOXACIN Value in next row Sensitive      SENSITIVEPerformed at [facility], [address] [place], [place] [address]    GENTAMICIN Value in next row Sensitive      SENSITIVEPerformed at [facility], [address] [place], [place] [address]    IMIPENEM Value in next row Sensitive      SENSITIVEPerformed at [facility], [address] [place], [place] [address]    TRIMETH /SULFA  Value in next row Sensitive      SENSITIVEPerformed at [facility], [address] [place], [place] [address]    AMPICILLIN/SULBACTAM Value in next row Sensitive      SENSITIVEPerformed at [facility], [address], [place], [place] [address]    PIP/TAZO Value in next row Sensitive ug/mL     SENSITIVEPerformed at [facility], [address], [place], [place] [address]    * ESCHERICHIA COLI    ESCHERICHIA COLI  Culture, blood (Routine X 2) w Reflex to ID Panel  Status: Abnormal (Preliminary result)   Collection Time: 04/21/24 12:02 PM   Specimen: BLOOD RIGHT ARM  Result Value Ref Range Status   Specimen Description BLOOD RIGHT ARM  Final   Special Requests   Final    BOTTLES DRAWN AEROBIC ONLY Blood Culture adequate volume   Culture  Setup Time   Final    GRAM NEGATIVE RODS GRAM POSITIVE COCCI AEROBIC BOTTLE ONLY    Culture (A)  Final    MORGANELLA MORGANII ESCHERICHIA COLI SUSCEPTIBILITIES PERFORMED ON  PREVIOUS CULTURE WITHIN THE LAST 5 DAYS. CULTURE REINCUBATED FOR BETTER GROWTH Performed at [facility], [address] [place], [place] [address]    Report Status PENDING  Incomplete   Organism ID, Bacteria MORGANELLA MORGANII  Final      Susceptibility   Morganella morganii - MIC*    AMPICILLIN >=32 RESISTANT Resistant     CEFTAZIDIME <=1 SENSITIVE Sensitive     CIPROFLOXACIN <=0.25 SENSITIVE Sensitive     GENTAMICIN <=1 SENSITIVE Sensitive     IMIPENEM 1 SENSITIVE Sensitive     TRIMETH /SULFA  <=20 SENSITIVE Sensitive     AMPICILLIN/SULBACTAM >=32 RESISTANT Resistant     PIP/TAZO <=4 SENSITIVE Sensitive ug/mL    * MORGANELLA MORGANII  Blood Culture ID Panel (Reflexed)     Status: Abnormal   Collection Time: 04/21/24 12:02 PM  Result Value Ref Range Status   Enterococcus faecalis NOT DETECTED NOT DETECTED Final   Enterococcus Faecium NOT DETECTED NOT DETECTED Final   Listeria monocytogenes NOT DETECTED NOT DETECTED Final   Staphylococcus species DETECTED (A) NOT DETECTED Final    Comment: CRITICAL RESULT CALLED TO, READ BACK BY AND VERIFIED WITH: J WYLAND,PHARMD@0319  04/22/24 MK    Staphylococcus aureus (BCID) NOT DETECTED NOT DETECTED Final   Staphylococcus epidermidis DETECTED (A) NOT DETECTED Final    Comment: CRITICAL RESULT CALLED TO, READ BACK BY AND VERIFIED WITH: J [facility]  04/22/24 MK    Staphylococcus lugdunensis NOT DETECTED NOT DETECTED Final   Streptococcus species NOT DETECTED NOT DETECTED Final   Streptococcus agalactiae NOT DETECTED NOT DETECTED Final   Streptococcus pneumoniae NOT DETECTED NOT DETECTED Final   Streptococcus pyogenes NOT DETECTED NOT DETECTED Final   A.calcoaceticus-baumannii NOT DETECTED NOT DETECTED Final   Bacteroides fragilis NOT DETECTED NOT DETECTED Final   Enterobacterales DETECTED (A) NOT DETECTED Final    Comment: Enterobacterales represent a large order of gram negative bacteria, not a single organism. CRITICAL  RESULT CALLED TO, READ BACK BY AND VERIFIED WITH: J WYLAND,PHARMD@0319  04/22/24 MK    Enterobacter cloacae complex NOT DETECTED NOT DETECTED Final   Escherichia coli DETECTED (A) NOT DETECTED Final    Comment: CRITICAL RESULT CALLED TO, READ BACK BY AND VERIFIED WITH: J WYLAND,PHARMD@0319  04/22/24 MK    Klebsiella aerogenes NOT DETECTED NOT DETECTED Final   Klebsiella oxytoca NOT DETECTED NOT DETECTED Final   Klebsiella pneumoniae NOT DETECTED NOT DETECTED Final   Proteus species NOT DETECTED NOT DETECTED Final   Salmonella species NOT DETECTED NOT DETECTED Final   Serratia marcescens NOT DETECTED NOT DETECTED Final   Haemophilus influenzae NOT DETECTED NOT DETECTED Final   Neisseria meningitidis NOT DETECTED NOT DETECTED Final   Pseudomonas aeruginosa NOT DETECTED NOT DETECTED Final   Stenotrophomonas maltophilia NOT DETECTED NOT DETECTED Final   Candida albicans NOT DETECTED NOT DETECTED Final   Candida auris NOT DETECTED NOT DETECTED Final   Candida glabrata NOT DETECTED NOT DETECTED Final   Candida krusei NOT DETECTED NOT DETECTED Final  Candida parapsilosis NOT DETECTED NOT DETECTED Final   Candida tropicalis NOT DETECTED NOT DETECTED Final   Cryptococcus neoformans/gattii NOT DETECTED NOT DETECTED Final   CTX-M ESBL NOT DETECTED NOT DETECTED Final   Carbapenem resistance IMP NOT DETECTED NOT DETECTED Final   Carbapenem resistance KPC NOT DETECTED NOT DETECTED Final   Methicillin resistance mecA/C NOT DETECTED NOT DETECTED Final   Carbapenem resistance NDM NOT DETECTED NOT DETECTED Final   Carbapenem resist OXA 48 LIKE NOT DETECTED NOT DETECTED Final   Carbapenem resistance VIM NOT DETECTED NOT DETECTED Final    Comment: Performed at [facility], [address] [place], [place] [address]  Culture, blood (Routine X 2) w Reflex to ID Panel     Status: None (Preliminary result)   Collection Time: 04/23/24  7:55 AM   Specimen: BLOOD RIGHT ARM  Result Value Ref Range  Status   Specimen Description BLOOD RIGHT ARM  Final   Special Requests   Final    BOTTLES DRAWN AEROBIC AND ANAEROBIC Blood Culture results may not be optimal due to an inadequate volume of blood received in culture bottles   Culture   Final    NO GROWTH < 24 HOURS Performed at [facility], [address], [place], [place] [address]    Report Status PENDING  Incomplete  Culture, blood (Routine X 2) w Reflex to ID Panel     Status: None (Preliminary result)   Collection Time: 04/23/24  7:57 AM   Specimen: BLOOD LEFT HAND  Result Value Ref Range Status   Specimen Description BLOOD LEFT HAND  Final   Special Requests   Final    BOTTLES DRAWN AEROBIC AND ANAEROBIC Blood Culture adequate volume   Culture   Final    NO GROWTH < 24 HOURS Performed at [facility], [address], [place], [place] [address]    Report Status PENDING  Incomplete    RADIOLOGY STUDIES/RESULTS: NM Hepatobiliary Liver Func Result Date: 04/23/2024 CLINICAL DATA:  Right upper quadrant abdominal pain concern for acalculous cholecystitis. EXAM: NUCLEAR MEDICINE HEPATOBILIARY IMAGING TECHNIQUE: Sequential images of the abdomen were obtained out to 60 minutes following intravenous administration of radiopharmaceutical. RADIOPHARMACEUTICALS:  7.5 mCi Tc-64m  Choletec IV COMPARISON:  Ultrasound Apr 22, 2024 FINDINGS: Prompt uptake and biliary excretion of activity by the liver is seen. Gallbladder activity is not visualized. Biliary activity passes into small bowel, consistent with patent common bile duct. IMPRESSION: Non visualization of the gallbladder compatible with occluded cystic duct/cholecystitis. Electronically Signed   By: Tama Fails M.D.   On: 04/23/2024 15:39      LOS: 6 days   Kimberly Penna, MD  [facility]    To contact the attending provider between 7A-7P or the covering provider during after hours 7P-7A, please log into the web site [email or web address] and access using universal  Assumption password for that web site. If you do not have the password, please call the hospital operator.  04/24/2024, 11:40 AM

## 2024-04-24 NOTE — TOC Progression Note (Signed)
 Transition of Care [facility]) - Progression Note    Patient Details  Name: Tony Lopez MRN: [id number] Date of Birth: 1951-10-24  [facility]) CM/SW Contact  Jannice Mends, LCSW Phone Number: 04/24/2024, 3:18 PM  Clinical Narrative:    CSW continuing to follow.      Barriers to Discharge: Continued Medical Work up, English as a second language teacher, SNF Pending bed offer  Expected Discharge Plan and Services In-house Referral: Clinical Social Work     Living arrangements for the past 2 months: Single Family Home                                       Social Determinants of Health (SDOH) Interventions SDOH Screenings   Food Insecurity: No Food Insecurity (04/19/2024)  Housing: Low Risk  (04/19/2024)  Transportation Needs: No Transportation Needs (04/19/2024)  Utilities: Not At Risk (04/19/2024)  Tobacco Use: High Risk (04/18/2024)    Readmission Risk Interventions     No data to display

## 2024-04-25 ENCOUNTER — Inpatient Hospital Stay (HOSPITAL_COMMUNITY): Admitting: Anesthesiology

## 2024-04-25 ENCOUNTER — Encounter (HOSPITAL_COMMUNITY): Payer: Self-pay | Admitting: Internal Medicine

## 2024-04-25 ENCOUNTER — Other Ambulatory Visit: Payer: Self-pay

## 2024-04-25 ENCOUNTER — Encounter (HOSPITAL_COMMUNITY)
Admission: EM | Disposition: A | Discharge status: Skilled Nursing Facility | Payer: Self-pay | Source: Home / Self Care | Attending: Internal Medicine

## 2024-04-25 DIAGNOSIS — F17210 Nicotine dependence, cigarettes, uncomplicated: Secondary | ICD-10-CM | POA: Diagnosis not present

## 2024-04-25 DIAGNOSIS — K7010 Alcoholic hepatitis without ascites: Secondary | ICD-10-CM | POA: Diagnosis not present

## 2024-04-25 DIAGNOSIS — K812 Acute cholecystitis with chronic cholecystitis: Secondary | ICD-10-CM | POA: Diagnosis not present

## 2024-04-25 DIAGNOSIS — I10 Essential (primary) hypertension: Secondary | ICD-10-CM

## 2024-04-25 DIAGNOSIS — F10931 Alcohol use, unspecified with withdrawal delirium: Secondary | ICD-10-CM | POA: Diagnosis not present

## 2024-04-25 DIAGNOSIS — K821 Hydrops of gallbladder: Secondary | ICD-10-CM

## 2024-04-25 DIAGNOSIS — K922 Gastrointestinal hemorrhage, unspecified: Secondary | ICD-10-CM | POA: Diagnosis not present

## 2024-04-25 DIAGNOSIS — N179 Acute kidney failure, unspecified: Secondary | ICD-10-CM | POA: Diagnosis not present

## 2024-04-25 HISTORY — PX: CHOLECYSTECTOMY: SHX55

## 2024-04-25 LAB — CBC
HCT: 22.8 % — ABNORMAL LOW (ref 39.0–52.0)
Hemoglobin: 7.8 g/dL — ABNORMAL LOW (ref 13.0–17.0)
MCH: 33.3 pg (ref 26.0–34.0)
MCHC: 34.2 g/dL (ref 30.0–36.0)
MCV: 97.4 fL (ref 80.0–100.0)
Platelets: 209 10*3/uL (ref 150–400)
RBC: 2.34 MIL/uL — ABNORMAL LOW (ref 4.22–5.81)
RDW: 15.5 % (ref 11.5–15.5)
WBC: 16.7 10*3/uL — ABNORMAL HIGH (ref 4.0–10.5)
nRBC: 0.1 % (ref 0.0–0.2)

## 2024-04-25 LAB — CULTURE, BLOOD (ROUTINE X 2): Special Requests: ADEQUATE

## 2024-04-25 LAB — COMPREHENSIVE METABOLIC PANEL WITH GFR
ALT: 31 U/L (ref 0–44)
AST: 38 U/L (ref 15–41)
Albumin: 1.8 g/dL — ABNORMAL LOW (ref 3.5–5.0)
Alkaline Phosphatase: 126 U/L (ref 38–126)
Anion gap: 9 (ref 5–15)
BUN: 13 mg/dL (ref 8–23)
CO2: 22 mmol/L (ref 22–32)
Calcium: 7.7 mg/dL — ABNORMAL LOW (ref 8.9–10.3)
Chloride: 105 mmol/L (ref 98–111)
Creatinine, Ser: 0.79 mg/dL (ref 0.61–1.24)
GFR, Estimated: >60 mL/min (ref >60)
Glucose, Bld: 107 mg/dL — ABNORMAL HIGH (ref 70–99)
Potassium: 3.5 mmol/L (ref 3.5–5.1)
Sodium: 136 mmol/L (ref 135–145)
Total Bilirubin: 1.9 mg/dL — ABNORMAL HIGH (ref 0.0–1.2)
Total Protein: 5.2 g/dL — ABNORMAL LOW (ref 6.5–8.1)

## 2024-04-25 LAB — PHOSPHORUS: Phosphorus: 2.2 mg/dL — ABNORMAL LOW (ref 2.5–4.6)

## 2024-04-25 LAB — MAGNESIUM: Magnesium: 1.7 mg/dL (ref 1.7–2.4)

## 2024-04-25 LAB — GLUCOSE, CAPILLARY: Glucose-Capillary: 112 mg/dL — ABNORMAL HIGH (ref 70–99)

## 2024-04-25 SURGERY — LAPAROSCOPIC CHOLECYSTECTOMY
Anesthesia: General

## 2024-04-25 MED ORDER — CHLORHEXIDINE GLUCONATE 0.12 % MT SOLN
OROMUCOSAL | Status: AC
  Administered 2024-04-25: 15 mL via OROMUCOSAL
  Filled 2024-04-25: qty 15

## 2024-04-25 MED ORDER — HEMOSTATIC AGENTS (NO CHARGE) OPTIME
TOPICAL | Status: DC | PRN
  Administered 2024-04-25: 1 via TOPICAL

## 2024-04-25 MED ORDER — 0.9 % SODIUM CHLORIDE (POUR BTL) OPTIME
TOPICAL | Status: DC | PRN
  Administered 2024-04-25: 1000 mL

## 2024-04-25 MED ORDER — PHENYLEPHRINE 80 MCG/ML (10ML) SYRINGE FOR IV PUSH (FOR BLOOD PRESSURE SUPPORT)
PREFILLED_SYRINGE | INTRAVENOUS | Status: AC
  Filled 2024-04-25: qty 30

## 2024-04-25 MED ORDER — ACETAMINOPHEN 500 MG PO TABS
ORAL_TABLET | ORAL | Status: AC
  Filled 2024-04-25: qty 2

## 2024-04-25 MED ORDER — ALBUMIN HUMAN 5 % IV SOLN: INTRAVENOUS | Status: DC | PRN

## 2024-04-25 MED ORDER — ACETAMINOPHEN 500 MG PO TABS: 1000.0000 mg | ORAL_TABLET | Freq: Once | ORAL | Status: DC

## 2024-04-25 MED ORDER — BUPIVACAINE-EPINEPHRINE (PF) 0.25% -1:200000 IJ SOLN
INTRAMUSCULAR | Status: AC
  Filled 2024-04-25: qty 30

## 2024-04-25 MED ORDER — MAGNESIUM SULFATE 2 GM/50ML IV SOLN
2.0000 g | Freq: Once | INTRAVENOUS | Status: AC
  Administered 2024-04-25: 2 g via INTRAVENOUS
  Filled 2024-04-25: qty 50

## 2024-04-25 MED ORDER — DAPTOMYCIN-SODIUM CHLORIDE 700-0.9 MG/100ML-% IV SOLN
8.0000 mg/kg | Freq: Every day | INTRAVENOUS | Status: DC
  Administered 2024-04-26: 700 mg via INTRAVENOUS
  Filled 2024-04-25: qty 100

## 2024-04-25 MED ORDER — FENTANYL CITRATE (PF) 100 MCG/2ML IJ SOLN: 25.0000 ug | INTRAMUSCULAR | Status: DC | PRN

## 2024-04-25 MED ORDER — BUPIVACAINE-EPINEPHRINE 0.25% -1:200000 IJ SOLN
INTRAMUSCULAR | Status: DC | PRN
  Administered 2024-04-25: 13 mL

## 2024-04-25 MED ORDER — PROPOFOL 10 MG/ML IV BOLUS
INTRAVENOUS | Status: DC | PRN
  Administered 2024-04-25: 100 mg via INTRAVENOUS

## 2024-04-25 MED ORDER — LACTATED RINGERS IV SOLN: INTRAVENOUS | Status: DC

## 2024-04-25 MED ORDER — ONDANSETRON HCL 4 MG/2ML IJ SOLN: 4.0000 mg | Freq: Once | INTRAMUSCULAR | Status: DC | PRN

## 2024-04-25 MED ORDER — PROPOFOL 10 MG/ML IV BOLUS
INTRAVENOUS | Status: AC
  Filled 2024-04-25: qty 20

## 2024-04-25 MED ORDER — PHENYLEPHRINE HCL (PRESSORS) 10 MG/ML IV SOLN
INTRAVENOUS | Status: AC
  Filled 2024-04-25: qty 1

## 2024-04-25 MED ORDER — ONDANSETRON HCL 4 MG/2ML IJ SOLN
INTRAMUSCULAR | Status: DC | PRN
  Administered 2024-04-25: 4 mg via INTRAVENOUS

## 2024-04-25 MED ORDER — ORAL CARE MOUTH RINSE: 15.0000 mL | Freq: Once | OROMUCOSAL | Status: AC

## 2024-04-25 MED ORDER — ROCURONIUM BROMIDE 10 MG/ML (PF) SYRINGE
PREFILLED_SYRINGE | INTRAVENOUS | Status: AC
  Filled 2024-04-25: qty 20

## 2024-04-25 MED ORDER — ONDANSETRON HCL 4 MG/2ML IJ SOLN
INTRAMUSCULAR | Status: AC
  Filled 2024-04-25: qty 6

## 2024-04-25 MED ORDER — LIDOCAINE 2% (20 MG/ML) 5 ML SYRINGE
INTRAMUSCULAR | Status: AC
  Filled 2024-04-25: qty 15

## 2024-04-25 MED ORDER — DEXAMETHASONE SODIUM PHOSPHATE 10 MG/ML IJ SOLN
INTRAMUSCULAR | Status: AC
  Filled 2024-04-25: qty 3

## 2024-04-25 MED ORDER — DAPTOMYCIN-SODIUM CHLORIDE 700-0.9 MG/100ML-% IV SOLN
8.0000 mg/kg | INTRAVENOUS | Status: AC
  Administered 2024-04-25: 706.37 mg via INTRAVENOUS
  Filled 2024-04-25: qty 100

## 2024-04-25 MED ORDER — STERILE WATER FOR IRRIGATION IR SOLN
Status: DC | PRN
  Administered 2024-04-25: 1000 mL

## 2024-04-25 MED ORDER — FENTANYL CITRATE (PF) 250 MCG/5ML IJ SOLN
INTRAMUSCULAR | Status: AC
  Filled 2024-04-25: qty 5

## 2024-04-25 MED ORDER — LIDOCAINE 2% (20 MG/ML) 5 ML SYRINGE
INTRAMUSCULAR | Status: DC | PRN
  Administered 2024-04-25: 100 mg via INTRAVENOUS

## 2024-04-25 MED ORDER — PHENYLEPHRINE HCL-NACL 20-0.9 MG/250ML-% IV SOLN
INTRAVENOUS | Status: DC | PRN
  Administered 2024-04-25: 45 ug/min via INTRAVENOUS

## 2024-04-25 MED ORDER — CHLORHEXIDINE GLUCONATE 0.12 % MT SOLN: 15.0000 mL | Freq: Once | OROMUCOSAL | Status: AC

## 2024-04-25 MED ORDER — POTASSIUM PHOSPHATES 15 MMOLE/5ML IV SOLN
30.0000 mmol | Freq: Once | INTRAVENOUS | Status: AC
  Administered 2024-04-25: 30 mmol via INTRAVENOUS
  Filled 2024-04-25: qty 10

## 2024-04-25 MED ORDER — ROCURONIUM BROMIDE 10 MG/ML (PF) SYRINGE
PREFILLED_SYRINGE | INTRAVENOUS | Status: DC | PRN
  Administered 2024-04-25: 20 mg via INTRAVENOUS
  Administered 2024-04-25: 60 mg via INTRAVENOUS

## 2024-04-25 MED ORDER — SODIUM CHLORIDE 0.9 % IR SOLN
Status: DC | PRN
  Administered 2024-04-25: 1000 mL

## 2024-04-25 MED ORDER — FENTANYL CITRATE (PF) 250 MCG/5ML IJ SOLN
INTRAMUSCULAR | Status: DC | PRN
  Administered 2024-04-25: 50 ug via INTRAVENOUS

## 2024-04-25 MED ORDER — DEXAMETHASONE SODIUM PHOSPHATE 10 MG/ML IJ SOLN
INTRAMUSCULAR | Status: DC | PRN
  Administered 2024-04-25: 4 mg via INTRAVENOUS

## 2024-04-25 MED ORDER — SUGAMMADEX SODIUM 200 MG/2ML IV SOLN
INTRAVENOUS | Status: DC | PRN
  Administered 2024-04-25: 200 mg via INTRAVENOUS

## 2024-04-25 MED ORDER — SPY AGENT GREEN - (INDOCYANINE FOR INJECTION)
INTRAMUSCULAR | Status: DC | PRN
  Administered 2024-04-25: 2.5 mg via INTRAVENOUS

## 2024-04-25 MED ORDER — PHENYLEPHRINE 80 MCG/ML (10ML) SYRINGE FOR IV PUSH (FOR BLOOD PRESSURE SUPPORT)
PREFILLED_SYRINGE | INTRAVENOUS | Status: DC | PRN
  Administered 2024-04-25 (×4): 160 ug via INTRAVENOUS
  Administered 2024-04-25: 80 ug via INTRAVENOUS

## 2024-04-25 SURGICAL SUPPLY — 41 items
BAG COUNTER SPONGE SURGICOUNT (BAG) ×2 IMPLANT
BLADE CLIPPER SURG (BLADE) IMPLANT
BLADE HEX COATED 2.75 (ELECTRODE) IMPLANT
CANISTER SUCTION 3000ML PPV (SUCTIONS) ×2 IMPLANT
CATH ROBINSON RED A/P 16FR (CATHETERS) IMPLANT
CHLORAPREP W/TINT 26 (MISCELLANEOUS) ×2 IMPLANT
CLIP APPLIE 5 13 M/L LIGAMAX5 (MISCELLANEOUS) ×2 IMPLANT
CNTNR URN SCR LID CUP LEK RST (MISCELLANEOUS) IMPLANT
COVER SURGICAL LIGHT HANDLE (MISCELLANEOUS) ×2 IMPLANT
DERMABOND ADVANCED .7 DNX12 (GAUZE/BANDAGES/DRESSINGS) ×2 IMPLANT
DISSECTOR BLUNT TIP ENDO 5MM (MISCELLANEOUS) IMPLANT
DRAIN CHANNEL 19F RND (DRAIN) IMPLANT
ELECTRODE REM PT RTRN 9FT ADLT (ELECTROSURGICAL) ×2 IMPLANT
EVACUATOR SILICONE 100CC (DRAIN) IMPLANT
GLOVE BIO SURGEON STRL SZ7.5 (GLOVE) ×2 IMPLANT
GLOVE INDICATOR 8.0 STRL GRN (GLOVE) ×2 IMPLANT
GOWN STRL REUS W/ TWL LRG LVL3 (GOWN DISPOSABLE) ×4 IMPLANT
GOWN STRL REUS W/ TWL XL LVL3 (GOWN DISPOSABLE) ×2 IMPLANT
HEMOSTAT HEMOBLAST BELLOWS (HEMOSTASIS) IMPLANT
IRRIGATION SUCT STRKRFLW 2 WTP (MISCELLANEOUS) ×2 IMPLANT
KIT BASIN OR (CUSTOM PROCEDURE TRAY) ×2 IMPLANT
KIT IMAGING PINPOINTPAQ (MISCELLANEOUS) IMPLANT
KIT TURNOVER KIT B (KITS) ×2 IMPLANT
NS IRRIG 1000ML POUR BTL (IV SOLUTION) ×2 IMPLANT
PAD ARMBOARD POSITIONER FOAM (MISCELLANEOUS) ×2 IMPLANT
PENCIL BUTTON HOLSTER BLD 10FT (ELECTRODE) ×2 IMPLANT
POUCH RETRIEVAL ECOSAC 10 (ENDOMECHANICALS) IMPLANT
SCISSORS LAP 5X35 DISP (ENDOMECHANICALS) ×2 IMPLANT
SET TUBE SMOKE EVAC HIGH FLOW (TUBING) ×2 IMPLANT
SHEARS HARMONIC 36 ACE (MISCELLANEOUS) IMPLANT
SPONGE DRAIN TRACH 4X4 STRL 2S (GAUZE/BANDAGES/DRESSINGS) IMPLANT
SUT MNCRL AB 4-0 PS2 18 (SUTURE) ×2 IMPLANT
SUT VICRYL 0 UR6 27IN ABS (SUTURE) IMPLANT
SYSTEM BAG RETRIEVAL 10MM (BASKET) IMPLANT
TAPE CLOTH SURG 4X10 WHT LF (GAUZE/BANDAGES/DRESSINGS) IMPLANT
TOWEL GREEN STERILE FF (TOWEL DISPOSABLE) ×2 IMPLANT
TRAY LAPAROSCOPIC MC (CUSTOM PROCEDURE TRAY) ×2 IMPLANT
TROCAR ADV FIXATION 5X100MM (TROCAR) ×6 IMPLANT
TROCAR BALLN 12MMX100 BLUNT (TROCAR) ×2 IMPLANT
WARMER LAPAROSCOPE (MISCELLANEOUS) ×2 IMPLANT
WATER STERILE IRR 1000ML POUR (IV SOLUTION) ×2 IMPLANT

## 2024-04-25 NOTE — Transfer of Care (Signed)
 Immediate Anesthesia Transfer of Care Note  Patient: Tony Lopez  Procedure(s) Performed: SUBTOTAL CHOLECYSTECTOMY  Patient Location: PACU  Anesthesia Type:General  Level of Consciousness: sedated  Airway & Oxygen Therapy: Patient Spontanous Breathing and Patient connected to face mask oxygen  Post-op Assessment: Report given to RN and Post -op Vital signs reviewed and stable  Post vital signs: Reviewed and stable  Last Vitals:  Vitals Value Taken Time  BP    Temp 36.6 C 04/25/24 1449  Pulse 89 04/25/24 1452  Resp 24 04/25/24 1452  SpO2 94 % 04/25/24 1452  Vitals shown include unfiled device data.  Last Pain:  Vitals:   04/25/24 1136  TempSrc:   PainSc: Asleep         Complications: No notable events documented.

## 2024-04-25 NOTE — Plan of Care (Signed)
 Id brief note  Polymicrobial bacteremia Cholecystitis  E avium on cx as well    Patient currently in OR for cholecystectomy   -continue abx including daptomycin for current polymicrobial bacteremia -repeat bcx tomorrow -will formally see tomorrow

## 2024-04-25 NOTE — TOC Progression Note (Signed)
 Transition of Care [facility]) - Progression Note    Patient Details  Name: Tony Lopez MRN: [id number] Date of Birth: August 01, 1951  Transition of Care [facility]) CM/SW Contact  Jannice Mends, LCSW Phone Number: 04/25/2024, 4:57 PM  Clinical Narrative:    CSW continuing to follow for medical stability for SNF referrals.     Barriers to Discharge: Continued Medical Work up, English as a second language teacher, SNF Pending bed offer  Expected Discharge Plan and Services In-house Referral: Clinical Social Work     Living arrangements for the past 2 months: Single Family Home                                       Social Determinants of Health (SDOH) Interventions SDOH Screenings   Food Insecurity: No Food Insecurity (04/19/2024)  Housing: Low Risk  (04/19/2024)  Transportation Needs: No Transportation Needs (04/19/2024)  Utilities: Not At Risk (04/19/2024)  Tobacco Use: High Risk (04/25/2024)    Readmission Risk Interventions     No data to display

## 2024-04-25 NOTE — Anesthesia Preprocedure Evaluation (Addendum)
 Anesthesia Evaluation  Patient identified by MRN, date of birth, ID band Patient confused    Reviewed: Allergy & Precautions, NPO status , Patient's Chart, lab work & pertinent test results, Unable to perform ROS - Chart review only  Airway Mallampati: II  TM Distance: >3 FB Neck ROM: Full    Dental  (+) Dental Advisory Given, Lower Dentures, Upper Dentures   Pulmonary Current Smoker and Patient abstained from smoking.   Pulmonary exam normal breath sounds clear to auscultation       Cardiovascular hypertension, Pt. on medications Normal cardiovascular exam Rhythm:Regular Rate:Normal     Neuro/Psych negative neurological ROS  negative psych ROS   GI/Hepatic ,GERD  Medicated,,(+)     substance abuse  alcohol use, Hepatitis -, CAcute cholecystitis   Endo/Other  negative endocrine ROS    Renal/GU    Prostate cancer     Musculoskeletal  (+) Arthritis ,    Abdominal   Peds  Hematology  (+) Blood dyscrasia, anemia Chronic thrombocytopenia   Anesthesia Other Findings Day of surgery medications reviewed with the patient.  Reproductive/Obstetrics                             Anesthesia Physical Anesthesia Plan  ASA: 3  Anesthesia Plan: General   Post-op Pain Management: Tylenol  PO (pre-op)*   Induction: Intravenous  PONV Risk Score and Plan: 2 and Dexamethasone , Ondansetron  and Treatment may vary due to age or medical condition  Airway Management Planned: Oral ETT  Additional Equipment:   Intra-op Plan:   Post-operative Plan: Extubation in OR  Informed Consent: I have reviewed the patients History and Physical, chart, labs and discussed the procedure including the risks, benefits and alternatives for the proposed anesthesia with the patient or authorized representative who has indicated his/her understanding and acceptance.     Dental advisory given and Consent reviewed with  POA  Plan Discussed with: CRNA  Anesthesia Plan Comments: (Consent with ex-wife over telephone.)       Anesthesia Quick Evaluation

## 2024-04-25 NOTE — Plan of Care (Signed)
   Problem: Nutrition: Goal: Adequate nutrition will be maintained Outcome: Progressing   Problem: Elimination: Goal: Will not experience complications related to bowel motility Outcome: Progressing   Problem: Safety: Goal: Ability to remain free from injury will improve Outcome: Progressing   Problem: Skin Integrity: Goal: Risk for impaired skin integrity will decrease Outcome: Progressing

## 2024-04-25 NOTE — Progress Notes (Signed)
 PROGRESS NOTE        PATIENT DETAILS Name: Tony Lopez Age: 73 y.o. Sex: male Date of Birth: May 08, 1951 Admit Date: 04/18/2024 Admitting Physician [name], MD WUJ:[id number], [name], [name]  Brief Summary: Patient is a 73 y.o.  male with history of HTN, prostate cancer, GERD, EtOH use-who was admitted for alcohol withdrawal with delirium tremens.  Hospital course complicated by gram-negative bacteremia and acute calculus cholecystitis.  Significant events: 5/23>> admit to [facility]. 5/26>>Fever-blood culture- gm neg rod/gm +ve rod 5/27>> RUQ tender-[facility] consulted. 5/28>> HIDA scan +ve  Significant studies: 5/23>> CT head: No acute intracranial abnormality 5/23>> CT abdomen/pelvis: No acute abnormality. 5/24>> RUQ ultrasound: Biliary sludge/small gallstones.  No evidence of acute cholecystitis. 5/27>> RUQ ultrasound: Distended gallbladder with cholelithiasis/sludge. 5/28>> HIDA scan: Nonvisualization of the gallbladder  Significant microbiology data: 5/25>> GI pathogen panel: Negative 5/26>> blood culture: E. coli/Morganella/gram-positive cocci (BCID staph epi/E. coli-suspect staph epi is a contamination) 5/28>> blood cultures: No growth  Procedures: None  Consults: GI CCS  Subjective: Had some episodes of delirium last night but completely awake and alert this morning.  Febrile to 101 F yesterday afternoon.  Objective: Vitals: Blood pressure 111/65, pulse 68, temperature 98.4 F (36.9 C), temperature source Oral, resp. rate 20, height 6' (1.829 m), weight 88.3 kg, SpO2 91%.   Exam: Sleepy but awake/alert-answers questions appropriately Not in any distress Chest: Clear to auscultation CVS: S1-S2 regular\ abdomen: Soft-mildly tender RUQ area Extremities: No edema Nonfocal exam but has generalized weakness.   Pertinent Labs/Radiology:    Latest Ref Rng & Units 04/25/2024    6:10 AM 04/24/2024    4:51 AM 04/23/2024    3:25 AM  CBC   WBC 4.0 - 10.5 K/uL 16.7  12.5  11.2   Hemoglobin 13.0 - 17.0 g/dL 7.8  7.5  8.3   Hematocrit 39.0 - 52.0 % 22.8  22.4  24.3   Platelets 150 - 400 K/uL 209  136  135     Lab Results  Component Value Date   NA 136 04/25/2024   K 3.5 04/25/2024   CL 105 04/25/2024   CO2 22 04/25/2024      Assessment/Plan: EtOH withdrawal with DTs Per psych NP documentation-last drink was on 5/22 (1 quart of whiskey will last every 2 days)  Clinically improved-much more awake and alert-DTs/withdrawal symptoms have essentially resolved He should be out of the window for alcohol withdrawal at this point-delirium last night was probably hospital delirium Continue supportive care-if hospital delirium worsens-will schedule low-dose Seroquel at night.    Sinus tachycardia with PACs Secondary to DTs Tachycardia has essentially resolved-with treatment of underlying DTs-and with initiation of low-dose beta-blockers.  E. coli/Morganella bacteremia with acute calculus cholecystitis Likely secondary to biliary source-likely acute calculus cholecystitis Febrile yesterday-worsening leukocytosis however repeat cultures negative Remains on IV cefepime Laparoscopic cholecystectomy scheduled for 5/30.  Note-Gram-positive cocci in blood cultures is likely a contamination  AKI Hemodynamically mediated-poor oral intake/use of lisinopril /HCTZ Resolved with supportive care.  Hyponatremia Secondary to dehydration/poor oral intake/HCTZ use Resolved.  Hypomagnesemia/hypophosphatemia Replete/recheck.  Diarrhea Prn immodium GI pathogen panel negative Thankfully diarrhea has resolved.  Elevated lipase level Initially not felt to have abdominal pain-no evidence of pancreatitis on CT However on 5/27-RUQ pain-see above regarding plans for repeat RUQ ultrasound given gram-negative bacteremia.    EtOH hepatitis Transaminases have essentially normalized with  supportive care Per GI note-no indication for steroid as  discriminant function I<10.  Thrombocytopenia Secondary to EtOH use Resolved.  Normocytic anemia Probably related to EtOH related bone marrow suppression No evidence of acute blood loss-although FOBT positive Trend CBC for now.  Guaiac positive stool Outpatient GI follow-up-as no evidence of active bleeding.  Hb stable.  Cholelithiasis Initially felt to be asymptomatic-but now having fever and RUQ tenderness-see above.  1.6 x 2.4 cm structure arising from left kidney Incidental finding on CT abdomen. Per radiology-favored to represent proteinaceous/hemorrhagic cyst Will need outpatient imaging postdischarge.  Tobacco abuse Transdermal nicotine Some wheezing today-longstanding history-unclear if he has underlying COPD-starting bronchodilators and seeing how he does.  BMI: Estimated body mass index is 26.4 kg/m as calculated from the following:   Height as of this encounter: 6' (1.829 m).   Weight as of this encounter: 88.3 kg.   Code status:   Code Status: Full Code   DVT Prophylaxis: SCDs Start: 04/18/24 2024   Family Communication: Ex wife-Nikki Spark [phone] updated over the phone 5/27   Disposition Plan: Status is: Inpatient Remains inpatient appropriate because: Severity of illness   Planned Discharge Destination: SNF   Diet: Diet Order             Diet NPO time specified Except for: Sips with Meds  Diet effective midnight                     Antimicrobial agents: Anti-infectives (From admission, onward)    Start     Dose/Rate Route Frequency Ordered Stop   04/23/24 1430  ceFEPIme (MAXIPIME) 2 g in sodium chloride  0.9 % 100 mL IVPB        2 g 200 mL/hr over 30 Minutes Intravenous Every 8 hours 04/23/24 1337     04/22/24 0400  cefTRIAXone (ROCEPHIN) 2 g in sodium chloride  0.9 % 100 mL IVPB  Status:  Discontinued        2 g 200 mL/hr over 30 Minutes Intravenous Every 24 hours 04/22/24 0333 04/23/24 1337         MEDICATIONS: Scheduled Meds:  allopurinol  100 mg Oral Daily   feeding supplement  237 mL Oral BID BM   folic acid  1 mg Oral Daily   ipratropium  0.5 mg Nebulization BID   levalbuterol  0.63 mg Nebulization BID   lidocaine   1 patch Transdermal Q24H   metoprolol tartrate  25 mg Oral BID   multivitamin with minerals  1 tablet Oral Daily   nicotine  21 mg Transdermal Daily   pantoprazole (PROTONIX) IV  40 mg Intravenous Q12H   thiamine  100 mg Oral Daily   Or   thiamine  100 mg Intravenous Daily   Continuous Infusions:  ceFEPime (MAXIPIME) IV 2 g (04/25/24 0508)   PRN Meds:.acetaminophen , loperamide, LORazepam, ondansetron  **OR** ondansetron  (ZOFRAN ) IV   I have personally reviewed following labs and imaging studies  LABORATORY DATA: CBC: Recent Labs  Lab 04/18/24 1354 04/18/24 2128 04/21/24 0332 04/22/24 0329 04/23/24 0325 04/24/24 0451 04/25/24 0610  WBC 10.7*   < > 10.1 10.3 11.2* 12.5* 16.7*  NEUTROABS 9.1*  --   --   --   --   --   --   HGB 11.1*   < > 8.6* 8.4* 8.3* 7.5* 7.8*  HCT 31.3*   < > 24.3* 24.2* 24.3* 22.4* 22.8*  MCV 94.8   < > 94.2 95.7 97.2 97.8 97.4  PLT 66*   < >  95* 122* 135* 136* 209   < > = values in this interval not displayed.    Basic Metabolic Panel: Recent Labs  Lab 04/21/24 0332 04/21/24 1343 04/22/24 0329 04/23/24 0325 04/24/24 0451 04/25/24 0610  NA 133* 134* 135 135 139 136  K 3.0* 3.7 3.2* 3.9 3.6 3.5  CL 96* 98 101 102 106 105  CO2 26 26 25 25 23 22   GLUCOSE 138* 152* 114* 101* 99 107*  BUN 21 18 14 11 12 13   CREATININE 0.90 0.80 0.71 0.72 0.83 0.79  CALCIUM 8.4* 8.2* 7.8* 7.7* 7.7* 7.7*  MG 1.8  --  1.6* 1.9 1.8 1.7  PHOS <1.0* 2.0*  --  3.4 2.7 2.2*    GFR: Estimated Creatinine Clearance: 93 mL/min (by C-G formula based on SCr of 0.79 mg/dL).  Liver Function Tests: Recent Labs  Lab 04/21/24 0332 04/22/24 0329 04/23/24 0325 04/24/24 0451 04/25/24 0610  AST 82* 64* 45* 36 38  ALT 65* 54* 40 33 31   ALKPHOS 109 118 123 123 126  BILITOT 3.1* 2.5* 2.1* 2.1* 1.9*  PROT 5.0* 4.9* 4.8* 4.7* 5.2*  ALBUMIN 2.4* 2.2* 2.0* 1.8* 1.8*   Recent Labs  Lab 04/18/24 1354 04/20/24 0528  LIPASE 253* 266*   No results for input(s): "AMMONIA" in the last 168 hours.  Coagulation Profile: Recent Labs  Lab 04/18/24 1437  INR 0.9    Cardiac Enzymes: No results for input(s): "CKTOTAL", "CKMB", "CKMBINDEX", "TROPONINI" in the last 168 hours.  BNP (last 3 results) No results for input(s): "PROBNP" in the last 8760 hours.  Lipid Profile: No results for input(s): "CHOL", "HDL", "LDLCALC", "TRIG", "CHOLHDL", "LDLDIRECT" in the last 72 hours.  Thyroid Function Tests: No results for input(s): "TSH", "T4TOTAL", "FREET4", "T3FREE", "THYROIDAB" in the last 72 hours.  Anemia Panel: No results for input(s): "VITAMINB12", "FOLATE", "FERRITIN", "TIBC", "IRON", "RETICCTPCT" in the last 72 hours.  Urine analysis:    Component Value Date/Time   COLORURINE AMBER (A) 04/21/2024 1114   APPEARANCEUR CLOUDY (A) 04/21/2024 1114   LABSPEC 1.027 04/21/2024 1114   PHURINE 5.0 04/21/2024 1114   GLUCOSEU NEGATIVE 04/21/2024 1114   HGBUR MODERATE (A) 04/21/2024 1114   BILIRUBINUR NEGATIVE 04/21/2024 1114   KETONESUR 5 (A) 04/21/2024 1114   PROTEINUR 30 (A) 04/21/2024 1114   NITRITE NEGATIVE 04/21/2024 1114   LEUKOCYTESUR NEGATIVE 04/21/2024 1114    Sepsis Labs: Lactic Acid, Venous No results found for: "LATICACIDVEN"  MICROBIOLOGY: Recent Results (from the past 240 hours)  Gastrointestinal Panel by PCR , Stool     Status: None   Collection Time: 04/20/24  6:39 PM   Specimen: Stool  Result Value Ref Range Status   Campylobacter species NOT DETECTED NOT DETECTED Final   Plesimonas shigelloides NOT DETECTED NOT DETECTED Final   Salmonella species NOT DETECTED NOT DETECTED Final   Yersinia enterocolitica NOT DETECTED NOT DETECTED Final   Vibrio species NOT DETECTED NOT DETECTED Final   Vibrio  cholerae NOT DETECTED NOT DETECTED Final   Enteroaggregative E coli (EAEC) NOT DETECTED NOT DETECTED Final   Enteropathogenic E coli (EPEC) NOT DETECTED NOT DETECTED Final   Enterotoxigenic E coli (ETEC) NOT DETECTED NOT DETECTED Final   Shiga like toxin producing E coli (STEC) NOT DETECTED NOT DETECTED Final   Shigella/Enteroinvasive E coli (EIEC) NOT DETECTED NOT DETECTED Final   Cryptosporidium NOT DETECTED NOT DETECTED Final   Cyclospora cayetanensis NOT DETECTED NOT DETECTED Final   Entamoeba histolytica NOT DETECTED NOT DETECTED Final  Giardia lamblia NOT DETECTED NOT DETECTED Final   Adenovirus F40/41 NOT DETECTED NOT DETECTED Final   Astrovirus NOT DETECTED NOT DETECTED Final   Norovirus GI/GII NOT DETECTED NOT DETECTED Final   Rotavirus A NOT DETECTED NOT DETECTED Final   Sapovirus (I, II, IV, and V) NOT DETECTED NOT DETECTED Final    Comment: Performed at [facility], [address] [place], [place] [address]  Culture, blood (Routine X 2) w Reflex to ID Panel     Status: Abnormal (Preliminary result)   Collection Time: 04/21/24 12:02 PM   Specimen: BLOOD LEFT HAND  Result Value Ref Range Status   Specimen Description BLOOD LEFT HAND  Final   Special Requests   Final    BOTTLES DRAWN AEROBIC AND ANAEROBIC Blood Culture adequate volume   Culture  Setup Time   Final    GRAM NEGATIVE RODS GRAM POSITIVE COCCI ANAEROBIC BOTTLE ONLY GRAM NEGATIVE RODS AEROBIC BOTTLE ONLY CRITICAL RESULT CALLED TO, READ BACK BY AND VERIFIED WITH: J WYLAND,PHARMD@0320  04/22/24 MK    Culture (A)  Final    ESCHERICHIA COLI GRAM POSITIVE COCCI CULTURE REINCUBATED FOR BETTER GROWTH    Report Status PENDING  Incomplete   Organism ID, Bacteria ESCHERICHIA COLI  Final   Organism ID, Bacteria ESCHERICHIA COLI  Final      Susceptibility   Escherichia coli - KIRBY BAUER*    CEFAZOLIN  Value in next row Sensitive      SENSITIVEPerformed at [facility], [address],  [place], [place] [address]   Escherichia coli - MIC*    AMPICILLIN Value in next row Sensitive      SENSITIVEPerformed at [facility], [address] [place], [place] [address]    CEFEPIME Value in next row Sensitive      SENSITIVEPerformed at [facility], [address], [place], [place] [address]    CEFTAZIDIME Value in next row Sensitive      SENSITIVEPerformed at [facility], [address], [place], [place] [address]    CEFTRIAXONE Value in next row Sensitive      SENSITIVEPerformed at [facility], [address], [place], [place] [address]    CIPROFLOXACIN Value in next row Sensitive      SENSITIVEPerformed at [facility], [address], [place], [place] [address]    GENTAMICIN Value in next row Sensitive      SENSITIVEPerformed at [facility], [address] [place] [place], [place] [address]    IMIPENEM Value in next row Sensitive      SENSITIVEPerformed at [facility], [address], [place], [place] [address]    TRIMETH /SULFA  Value in next row Sensitive      SENSITIVEPerformed at [facility], [address] [place], [place], [place] [address]    AMPICILLIN/SULBACTAM Value in next row Sensitive      SENSITIVEPerformed at [facility], [address] [place], [place] [address]    PIP/TAZO Value in next row Sensitive ug/mL     SENSITIVEPerformed at [facility], [address] [place], [place] [address]    * ESCHERICHIA COLI    ESCHERICHIA COLI  Culture, blood (Routine X 2) w Reflex to ID Panel     Status: Abnormal (Preliminary result)   Collection Time: 04/21/24 12:02 PM   Specimen: BLOOD RIGHT ARM  Result Value Ref Range Status   Specimen Description BLOOD RIGHT ARM  Final  Special Requests   Final    BOTTLES DRAWN AEROBIC ONLY Blood Culture adequate volume   Culture  Setup Time   Final    GRAM NEGATIVE RODS GRAM POSITIVE COCCI AEROBIC BOTTLE ONLY    Culture (A)  Final    MORGANELLA MORGANII ESCHERICHIA  COLI SUSCEPTIBILITIES PERFORMED ON PREVIOUS CULTURE WITHIN THE LAST 5 DAYS. CULTURE REINCUBATED FOR BETTER GROWTH Performed at [facility], [address] [place], [place] [address]    Report Status PENDING  Incomplete   Organism ID, Bacteria MORGANELLA MORGANII  Final      Susceptibility   Morganella morganii - MIC*    AMPICILLIN >=32 RESISTANT Resistant     CEFTAZIDIME <=1 SENSITIVE Sensitive     CIPROFLOXACIN <=0.25 SENSITIVE Sensitive     GENTAMICIN <=1 SENSITIVE Sensitive     IMIPENEM 1 SENSITIVE Sensitive     TRIMETH /SULFA  <=20 SENSITIVE Sensitive     AMPICILLIN/SULBACTAM >=32 RESISTANT Resistant     PIP/TAZO <=4 SENSITIVE Sensitive ug/mL    * MORGANELLA MORGANII  Blood Culture ID Panel (Reflexed)     Status: Abnormal   Collection Time: 04/21/24 12:02 PM  Result Value Ref Range Status   Enterococcus faecalis NOT DETECTED NOT DETECTED Final   Enterococcus Faecium NOT DETECTED NOT DETECTED Final   Listeria monocytogenes NOT DETECTED NOT DETECTED Final   Staphylococcus species DETECTED (A) NOT DETECTED Final    Comment: CRITICAL RESULT CALLED TO, READ BACK BY AND VERIFIED WITH: J WYLAND,PHARMD@0319  04/22/24 MK    Staphylococcus aureus (BCID) NOT DETECTED NOT DETECTED Final   Staphylococcus epidermidis DETECTED (A) NOT DETECTED Final    Comment: CRITICAL RESULT CALLED TO, READ BACK BY AND VERIFIED WITH: J WYLAND,PHARMD@0319  04/22/24 MK    Staphylococcus lugdunensis NOT DETECTED NOT DETECTED Final   Streptococcus species NOT DETECTED NOT DETECTED Final   Streptococcus agalactiae NOT DETECTED NOT DETECTED Final   Streptococcus pneumoniae NOT DETECTED NOT DETECTED Final   Streptococcus pyogenes NOT DETECTED NOT DETECTED Final   A.calcoaceticus-baumannii NOT DETECTED NOT DETECTED Final   Bacteroides fragilis NOT DETECTED NOT DETECTED Final   Enterobacterales DETECTED (A) NOT DETECTED Final    Comment: Enterobacterales represent a large order of gram negative bacteria,  not a single organism. CRITICAL RESULT CALLED TO, READ BACK BY AND VERIFIED WITH: J WYLAND,PHARMD@0319  04/22/24 MK    Enterobacter cloacae complex NOT DETECTED NOT DETECTED Final   Escherichia coli DETECTED (A) NOT DETECTED Final    Comment: CRITICAL RESULT CALLED TO, READ BACK BY AND VERIFIED WITH: J WYLAND,PHARMD@0319  04/22/24 MK    Klebsiella aerogenes NOT DETECTED NOT DETECTED Final   Klebsiella oxytoca NOT DETECTED NOT DETECTED Final   Klebsiella pneumoniae NOT DETECTED NOT DETECTED Final   Proteus species NOT DETECTED NOT DETECTED Final   Salmonella species NOT DETECTED NOT DETECTED Final   Serratia marcescens NOT DETECTED NOT DETECTED Final   Haemophilus influenzae NOT DETECTED NOT DETECTED Final   Neisseria meningitidis NOT DETECTED NOT DETECTED Final   Pseudomonas aeruginosa NOT DETECTED NOT DETECTED Final   Stenotrophomonas maltophilia NOT DETECTED NOT DETECTED Final   Candida albicans NOT DETECTED NOT DETECTED Final   Candida auris NOT DETECTED NOT DETECTED Final   Candida glabrata NOT DETECTED NOT DETECTED Final   Candida krusei NOT DETECTED NOT DETECTED Final   Candida parapsilosis NOT DETECTED NOT DETECTED Final   Candida tropicalis NOT DETECTED NOT DETECTED Final   Cryptococcus neoformans/gattii NOT DETECTED NOT DETECTED Final   CTX-M ESBL NOT DETECTED NOT DETECTED Final  Carbapenem resistance IMP NOT DETECTED NOT DETECTED Final   Carbapenem resistance KPC NOT DETECTED NOT DETECTED Final   Methicillin resistance mecA/C NOT DETECTED NOT DETECTED Final   Carbapenem resistance NDM NOT DETECTED NOT DETECTED Final   Carbapenem resist OXA 48 LIKE NOT DETECTED NOT DETECTED Final   Carbapenem resistance VIM NOT DETECTED NOT DETECTED Final    Comment: Performed at [facility], [address] [place], [place] [address]  Culture, blood (Routine X 2) w Reflex to ID Panel     Status: None (Preliminary result)   Collection Time: 04/23/24  7:55 AM   Specimen: BLOOD RIGHT  ARM  Result Value Ref Range Status   Specimen Description BLOOD RIGHT ARM  Final   Special Requests   Final    BOTTLES DRAWN AEROBIC AND ANAEROBIC Blood Culture results may not be optimal due to an inadequate volume of blood received in culture bottles   Culture   Final    NO GROWTH < 24 HOURS Performed at [facility], [address] [place], [place] [address]    Report Status PENDING  Incomplete  Culture, blood (Routine X 2) w Reflex to ID Panel     Status: None (Preliminary result)   Collection Time: 04/23/24  7:57 AM   Specimen: BLOOD LEFT HAND  Result Value Ref Range Status   Specimen Description BLOOD LEFT HAND  Final   Special Requests   Final    BOTTLES DRAWN AEROBIC AND ANAEROBIC Blood Culture adequate volume   Culture   Final    NO GROWTH < 24 HOURS Performed at [facility], [address] [place], [place] [address]    Report Status PENDING  Incomplete    RADIOLOGY STUDIES/RESULTS: NM Hepatobiliary Liver Func Result Date: 04/23/2024 CLINICAL DATA:  Right upper quadrant abdominal pain concern for acalculous cholecystitis. EXAM: NUCLEAR MEDICINE HEPATOBILIARY IMAGING TECHNIQUE: Sequential images of the abdomen were obtained out to 60 minutes following intravenous administration of radiopharmaceutical. RADIOPHARMACEUTICALS:  7.5 mCi Tc-23m  Choletec IV COMPARISON:  Ultrasound Apr 22, 2024 FINDINGS: Prompt uptake and biliary excretion of activity by the liver is seen. Gallbladder activity is not visualized. Biliary activity passes into small bowel, consistent with patent common bile duct. IMPRESSION: Non visualization of the gallbladder compatible with occluded cystic duct/cholecystitis. Electronically Signed   By: Tama Fails M.D.   On: 04/23/2024 15:39      LOS: 7 days   Kimberly Penna, MD  [facility]    To contact the attending provider between 7A-7P or the covering provider during after hours 7P-7A, please log into the web site [email or web address]  and access using universal [place] password for that web site. If you do not have the password, please call the hospital operator.  04/25/2024, 7:47 AM

## 2024-04-25 NOTE — Progress Notes (Signed)
 Patient is more alert and conversant but needs to be given with Ativan  due to increased agitation (pulling out all the chest leads, taking off his gown, taking off his purewick, and trying to get off the bed as he is seeing smoke inside the room and somebody lying on the recliner) probably due to some visual hallucinations. Patient reassured that he is safe and there's nobody else inside the room aside from the 2 of us . He also keeps on repeating that he wants to drink but explained that he has a planned procedure yet saying that he will not be going and will be moved to another day. Pt kept NPO post MN. Will continue to monitor the patient.

## 2024-04-25 NOTE — Op Note (Signed)
 04/25/2024 2:36 PM  PATIENT: Tony Lopez  73 y.o. male  Patient Care Team: Redmon, Noelle, [name] as PCP - General (Nurse Practitioner)  PRE-OPERATIVE DIAGNOSIS: Acute cholecystitis  POST-OPERATIVE DIAGNOSIS: Acute on chronic cholecystitis with hydrops gallbladder, empyema  PROCEDURE: Laparoscopic subtotal cholecystectomy  SURGEON: Beatris Lincoln, MD  ANESTHESIA: General endotracheal  EBL: 100 mL  DRAINS: 19 Fr round blake drain left draining right upper quadrant  SPECIMEN: Gallbladder wall and stones  COUNTS: Sponge, needle and instrument counts were reported correct x2 at the conclusion of the operation  DISPOSITION: PACU in satisfactory condition  COMPLICATIONS: none  FINDINGS: Acute on chronic cholecystitis with hydrops gallbladder.  Additionally, empyema.  Gallbladder still with clear fluid but additionally significant amounts of pus.  There is pus exuding from the wall of the gallbladder.  Significant inflammation of the pericholecystic tissues with an enlarged liver making cholecystectomy potentially dangerous.  For these reasons, we opted to proceed with a subtotal cholecystectomy.  Substantial portion of the gallbladder wall was removed.  Gallstones were from the gallbladder and a 19 French round Blake drain was placed at completion.  DESCRIPTION:   The patient was identified & brought into the operating room.  He was then positioned supine on the OR table. SCDs were in place and active during the entire case.  He then underwent general endotracheal anesthesia. Pressure points were padded. Hair on the abdomen was clipped by the OR team. The abdomen was prepped and draped in the standard sterile fashion. Antibiotics were administered. A surgical timeout was performed and confirmed our plan.   A periumbilical incision was made. The umbilical stalk was grasped and retracted outwardly. The infraumbilical fascia was identified and incised. The peritoneal cavity was gently  entered bluntly. A purse-string 0 Vicryl suture was placed. The Hasson cannula was inserted into the peritoneal cavity and insufflation with CO2 commenced to . A laparoscope was inserted into the peritoneal cavity and inspection confirmed no evidence of trocar site complications. The patient was then positioned in reverse Trendelenburg with slight left side down. 3 additional 5mm trocars were placed along the right subcostal line - one 5mm port in mid subcostal region, another 5mm port in the right flank near the anterior axillary line, and a third 5mm port in the left subxiphoid region obliquely near the falciform ligament.  The liver and gallbladder were inspected.  The liver is enlarged.  The gallbladder is initially not visible as it is coated in omentum.  This was able to be swept down carefully.  The gallbladder was tensely inflamed and injected.  With attempts at manipulation, the gallbladder did begin to spill purulent clear fluid.  CASE DATA:  Type of patient?: DOW CASE (Surgical Hospitalist [facility])  Status of Case? URGENT Add On  Infection Present At Time Of Surgery (PATOS)?  PURULENCE      There is no significant bile within the gallbladder.  This was consistent with a hydrops gallbladder with empyema.  I attempted to elevate the gallbladder in order to expose the infundibulum but with the size of his liver, this was exceedingly difficult and the infundibulum dove into the transverse mesocolon.  We felt that attempts at cholecystectomy at this point for significantly increase the risk for vascular biliary injury.  We opted to therefore proceed with a subtotal cholecystectomy.  The gallbladder wall was open.  Additional purulent fluid was evacuated.  We then used the harmonic scalpel to further open the gallbladder wall and to aid in hemostasis as the  wall is quite inflamed.  The anterior wall of the gallbladder was taken off approximately two thirds of the way down the body of  the gallbladder.  We did not want to go any lower so as to avoid injury to any surrounding structures and additionally this was the area where the gallbladder began to intimately associated with the transverse mesocolon.  The wall of the gallbladder removed the robotic scalpel.  There were a few stones within the lumen of the gallbladder.  Annika sack was placed into the abdomen.  The stones and gallbladder wall were placed within this.  This was removed and passed off as specimen.  The right upper quadrant is irrigated.  Hemostasis is verified.  Given the friable nature of his tissue, we opted to place some hemoblast spray in the gallbladder fossa.  A 19 French round Blake drain is brought to the field and inserted through our lateralmost right upper quadrant port site.  This was left draining the right upper quadrant and gallbladder fossa.  This was secured with a 2-0 nylon suture.  The rest of the abdomen was inspected no injury nor bleeding elsewhere was identified.  The RUQ ports were removed under direct visualization and noted to be hemostatic. The umbilical fascia was then closed using the 0 Vicryl purse-string suture. The fascia was palpated and noted to be completely closed. The skin of all incision sites was approximated with 4-0 monocryl subcuticular suture and dermabond applied.  He was then awakened from anesthesia, extubated, and transferred to a stretcher for transport to PACU in satisfactory condition.

## 2024-04-25 NOTE — Progress Notes (Signed)
 Progress Note  * Day of Surgery *  Subjective: Nocomplaints. Still with vague RUQ pain.  Objective: Vital signs in last 24 hours: Temp:  [98.1 F (36.7 C)-101.3 F (38.5 C)] 98.5 F (36.9 C) (05/30 0820) Pulse Rate:  [68-92] 92 (05/30 0820) Resp:  [20-29] 29 (05/30 0820) BP: (98-130)/(56-71) 130/67 (05/30 0820) SpO2:  [60 %-97 %] 93 % (05/30 0820) Last BM Date : 04/21/24  Intake/Output from previous day: 05/29 0701 - 05/30 0700 In: 100 [P.O.:100] Out: 400 [Urine:400] Intake/Output this shift: No intake/output data recorded.  PE: Heart: regular, rate, and rhythm.  Lungs: Respiratory effort nonlabored Abd: soft, mildly TTP in RUQ, protuberant abdomen  Lab Results:  Recent Labs    04/24/24 0451 04/25/24 0610  WBC 12.5* 16.7*  HGB 7.5* 7.8*  HCT 22.4* 22.8*  PLT 136* 209   BMET Recent Labs    04/24/24 0451 04/25/24 0610  NA 139 136  K 3.6 3.5  CL 106 105  CO2 23 22  GLUCOSE 99 107*  BUN 12 13  CREATININE 0.83 0.79  CALCIUM 7.7* 7.7*   PT/INR No results for input(s): "LABPROT", "INR" in the last 72 hours. CMP     Component Value Date/Time   NA 136 04/25/2024 0610   K 3.5 04/25/2024 0610   CL 105 04/25/2024 0610   CO2 22 04/25/2024 0610   GLUCOSE 107 (H) 04/25/2024 0610   BUN 13 04/25/2024 0610   CREATININE 0.79 04/25/2024 0610   CALCIUM 7.7 (L) 04/25/2024 0610   PROT 5.2 (L) 04/25/2024 0610   ALBUMIN  1.8 (L) 04/25/2024 0610   AST 38 04/25/2024 0610   ALT 31 04/25/2024 0610   ALKPHOS 126 04/25/2024 0610   BILITOT 1.9 (H) 04/25/2024 0610   GFRNONAA >60 04/25/2024 0610   GFRAA >60 10/06/2017 0501   Lipase     Component Value Date/Time   LIPASE 266 (H) 04/20/2024 0528       Studies/Results: NM Hepatobiliary Liver Func Result Date: 04/23/2024 CLINICAL DATA:  Right upper quadrant abdominal pain concern for acalculous cholecystitis. EXAM: NUCLEAR MEDICINE HEPATOBILIARY IMAGING TECHNIQUE: Sequential images of the abdomen were obtained out  to 60 minutes following intravenous administration of radiopharmaceutical. RADIOPHARMACEUTICALS:  7.5 mCi Tc-41m  Choletec  IV COMPARISON:  Ultrasound Apr 22, 2024 FINDINGS: Prompt uptake and biliary excretion of activity by the liver is seen. Gallbladder activity is not visualized. Biliary activity passes into small bowel, consistent with patent common bile duct. IMPRESSION: Non visualization of the gallbladder compatible with occluded cystic duct/cholecystitis. Electronically Signed   By: Tama Fails M.D.   On: 04/23/2024 15:39    Anti-infectives: Anti-infectives (From admission, onward)    Start     Dose/Rate Route Frequency Ordered Stop   04/23/24 1430  ceFEPIme  (MAXIPIME ) 2 g in sodium chloride  0.9 % 100 mL IVPB        2 g 200 mL/hr over 30 Minutes Intravenous Every 8 hours 04/23/24 1337     04/22/24 0400  cefTRIAXone  (ROCEPHIN ) 2 g in sodium chloride  0.9 % 100 mL IVPB  Status:  Discontinued        2 g 200 mL/hr over 30 Minutes Intravenous Every 24 hours 04/22/24 0333 04/23/24 1337        Assessment/Plan  E. Coli Bacteremia  Acute cholecystitis  - HIDA positive for cholecystitis  FEN: HH, NPO for planned surgery today VTE: SCDs ID: Rocephin    - per TR[facility] -  Acute alcohol withdrawal with DTs AKI, resolved Chronic thrombocytopenia - plts  135K Left renal lesion - outpatient follow up imaging recommended  Tobacco abuse  -He tells me that his ex-wife, Landa Pine, has been making medical decisions for him and his absence and he would like for me to discuss surgical plans with her as well..  Currently, is certainly more cognitively "with it" and when asked plans for today, he indicated to me that he knew he was having his gallbladder removed.  I did go ahead and discuss everything though over the phone with [place].  She indicates to me that they are still close and that she has been making medical decisions at least earlier in his hospitalization when he was more disoriented.  They  share a daughter together, Eldonna Greenspan.  Landa Pine indicates she has a hospital bed in her house and indicates that there are plans already in place for him to come to her home after he is discharged for further recovery.  -The anatomy and physiology of the hepatobiliary system was discussed with the patient. The pathophysiology of gallbladder disease was then reviewed as well. -The options for treatment were discussed including ongoing observation which may result in subsequent gallbladder complications (infection, pancreatitis, choledocholithiasis, etc), drainage procedures, and surgery - laparoscopic cholecystectomy with indocyanine green cholangiography  -The planned procedure, material risks (including, but not limited to, pain, bleeding, infection, scarring, need for blood transfusion, damage to surrounding structures- blood vessels/nerves/viscus/organs, damage to bile duct, bile leak, chronic diarrhea, conversion to a 'subtotal' cholecystectomy and general expectations therein, post-cholecystectomy diarrhea, potential need for additional procedures including EGD/ERCP, hernia, worsening of pre-existing medical conditions, pancreatitis, pneumonia, heart attack, stroke, death) benefits and alternatives to surgery were discussed at length. We have noted a good probability that the procedure would help improve their symptoms. The patient's questions were answered to his satisfaction, he voiced understanding and elected to proceed with surgery. Additionally, we discussed typical postoperative expectations and the recovery process.  I spent a total of 50 minutes today in both face-to-face and non-face-to-face activities to perform the following: review records, take and update history, examine the patient, counsel the patient on the diagnosis, and document encounter, findings, and plan in the EHR  for this visit on the date of this encounter.    LOS: 7 days   I reviewed hospitalist notes, last 24 h vitals and  pain scores, last 48 h intake and output, last 24 h labs and trends, and last 24 h imaging results.   Melvenia Stabs, MD  [facility] 04/25/2024, 10:43 AM Please see Amion for pager number during day hours 7:00am-4:30pm

## 2024-04-25 NOTE — Progress Notes (Signed)
 PT Cancellation Note  Patient Details Name: Tony Lopez MRN: [id number] DOB: 1951/02/26   Cancelled Treatment:    Reason Eval/Treat Not Completed: Patient at procedure or test/unavailable  Patient currently off unit to OR for surgery.  Will continue to follow acutely.  Jory Ng, PT, DPT [facility]  Rehabilitation Services Physical Therapist Office: [phone] Website: [email or web address]   Tony Lopez 04/25/2024, 1:15 PM

## 2024-04-25 NOTE — Anesthesia Procedure Notes (Signed)
 Procedure Name: Intubation Date/Time: 04/25/2024 1:16 PM  Performed by: Rochelle Chu, CRNAPre-anesthesia Checklist: Patient identified, Emergency Drugs available, Suction available and Patient being monitored Patient Re-evaluated:Patient Re-evaluated prior to induction Oxygen Delivery Method: Circle system utilized Preoxygenation: Pre-oxygenation with 100% oxygen Induction Type: IV induction Ventilation: Mask ventilation without difficulty and Oral airway inserted - appropriate to patient size Laryngoscope Size: Mac and 4 Grade View: Grade I Tube type: Oral Tube size: 7.5 mm Number of attempts: 1 Airway Equipment and Method: Stylet and Oral airway Placement Confirmation: ETT inserted through vocal cords under direct vision, positive ETCO2 and breath sounds checked- equal and bilateral Secured at: 22 cm Tube secured with: Tape Dental Injury: Teeth and Oropharynx as per pre-operative assessment

## 2024-04-25 NOTE — Plan of Care (Signed)
  Problem: Education: Goal: Knowledge of General Education information will improve Description: Including pain rating scale, medication(s)/side effects and non-pharmacologic comfort measures Outcome: Progressing   Problem: Clinical Measurements: Goal: Will remain free from infection Outcome: Progressing Goal: Diagnostic test results will improve Outcome: Progressing Goal: Respiratory complications will improve Outcome: Progressing   Problem: Activity: Goal: Risk for activity intolerance will decrease Outcome: Progressing   Problem: Nutrition: Goal: Adequate nutrition will be maintained Outcome: Progressing   Problem: Coping: Goal: Level of anxiety will decrease Outcome: Progressing   Problem: Safety: Goal: Ability to remain free from injury will improve Outcome: Progressing

## 2024-04-26 DIAGNOSIS — R7881 Bacteremia: Secondary | ICD-10-CM | POA: Diagnosis not present

## 2024-04-26 DIAGNOSIS — K812 Acute cholecystitis with chronic cholecystitis: Secondary | ICD-10-CM | POA: Insufficient documentation

## 2024-04-26 DIAGNOSIS — K7010 Alcoholic hepatitis without ascites: Secondary | ICD-10-CM | POA: Diagnosis not present

## 2024-04-26 DIAGNOSIS — K8000 Calculus of gallbladder with acute cholecystitis without obstruction: Secondary | ICD-10-CM

## 2024-04-26 DIAGNOSIS — K810 Acute cholecystitis: Secondary | ICD-10-CM | POA: Insufficient documentation

## 2024-04-26 DIAGNOSIS — F10931 Alcohol use, unspecified with withdrawal delirium: Secondary | ICD-10-CM | POA: Diagnosis not present

## 2024-04-26 DIAGNOSIS — N179 Acute kidney failure, unspecified: Secondary | ICD-10-CM | POA: Diagnosis not present

## 2024-04-26 DIAGNOSIS — K922 Gastrointestinal hemorrhage, unspecified: Secondary | ICD-10-CM | POA: Diagnosis not present

## 2024-04-26 LAB — CULTURE, BLOOD (ROUTINE X 2): Special Requests: ADEQUATE

## 2024-04-26 LAB — BASIC METABOLIC PANEL WITH GFR
Anion gap: 8 (ref 5–15)
BUN: 12 mg/dL (ref 8–23)
CO2: 23 mmol/L (ref 22–32)
Calcium: 7.5 mg/dL — ABNORMAL LOW (ref 8.9–10.3)
Chloride: 108 mmol/L (ref 98–111)
Creatinine, Ser: 0.76 mg/dL (ref 0.61–1.24)
GFR, Estimated: >60 mL/min (ref >60)
Glucose, Bld: 104 mg/dL — ABNORMAL HIGH (ref 70–99)
Potassium: 3.8 mmol/L (ref 3.5–5.1)
Sodium: 139 mmol/L (ref 135–145)

## 2024-04-26 LAB — CBC
HCT: 20.7 % — ABNORMAL LOW (ref 39.0–52.0)
Hemoglobin: 7 g/dL — ABNORMAL LOW (ref 13.0–17.0)
MCH: 34.3 pg — ABNORMAL HIGH (ref 26.0–34.0)
MCHC: 33.8 g/dL (ref 30.0–36.0)
MCV: 101.5 fL — ABNORMAL HIGH (ref 80.0–100.0)
Platelets: 260 10*3/uL (ref 150–400)
RBC: 2.04 MIL/uL — ABNORMAL LOW (ref 4.22–5.81)
RDW: 15.4 % (ref 11.5–15.5)
WBC: 15.1 10*3/uL — ABNORMAL HIGH (ref 4.0–10.5)
nRBC: 0 % (ref 0.0–0.2)

## 2024-04-26 LAB — PHOSPHORUS: Phosphorus: 2.7 mg/dL (ref 2.5–4.6)

## 2024-04-26 LAB — MAGNESIUM: Magnesium: 2.1 mg/dL (ref 1.7–2.4)

## 2024-04-26 LAB — CK: Total CK: 62 U/L (ref 49–397)

## 2024-04-26 MED ORDER — METHOCARBAMOL 1000 MG/10ML IJ SOLN: 1000.0000 mg | Freq: Four times a day (QID) | INTRAMUSCULAR | Status: DC | PRN

## 2024-04-26 MED ORDER — SIMETHICONE 40 MG/0.6ML PO SUSP: 80.0000 mg | Freq: Four times a day (QID) | ORAL | Status: DC | PRN

## 2024-04-26 MED ORDER — SODIUM CHLORIDE 0.9% FLUSH
3.0000 mL | Freq: Two times a day (BID) | INTRAVENOUS | Status: DC
  Administered 2024-04-26 – 2024-04-27 (×2): 3 mL via INTRAVENOUS

## 2024-04-26 MED ORDER — DIPHENHYDRAMINE HCL 50 MG/ML IJ SOLN
12.5000 mg | Freq: Four times a day (QID) | INTRAMUSCULAR | Status: DC | PRN
  Administered 2024-04-29: 12.5 mg via INTRAVENOUS
  Filled 2024-04-26: qty 1

## 2024-04-26 MED ORDER — MAGIC MOUTHWASH: 15.0000 mL | Freq: Four times a day (QID) | ORAL | Status: DC | PRN

## 2024-04-26 MED ORDER — PHENOL 1.4 % MT LIQD: 2.0000 | OROMUCOSAL | Status: DC | PRN

## 2024-04-26 MED ORDER — MENTHOL 3 MG MT LOZG: 1.0000 | LOZENGE | OROMUCOSAL | Status: DC | PRN

## 2024-04-26 MED ORDER — NAPHAZOLINE-GLYCERIN 0.012-0.25 % OP SOLN: 1.0000 [drp] | Freq: Four times a day (QID) | OPHTHALMIC | Status: DC | PRN

## 2024-04-26 MED ORDER — ALUM & MAG HYDROXIDE-SIMETH 200-200-20 MG/5ML PO SUSP: 30.0000 mL | Freq: Four times a day (QID) | ORAL | Status: DC | PRN

## 2024-04-26 MED ORDER — METHOCARBAMOL 500 MG PO TABS: 1000.0000 mg | ORAL_TABLET | Freq: Four times a day (QID) | ORAL | Status: DC | PRN

## 2024-04-26 MED ORDER — PIPERACILLIN-TAZOBACTAM 3.375 G IVPB
3.3750 g | Freq: Three times a day (TID) | INTRAVENOUS | Status: DC
  Administered 2024-04-26: 3.375 g via INTRAVENOUS
  Filled 2024-04-26: qty 50

## 2024-04-26 MED ORDER — ACETAMINOPHEN 500 MG PO TABS
500.0000 mg | ORAL_TABLET | Freq: Three times a day (TID) | ORAL | Status: DC
  Administered 2024-04-26 – 2024-04-29 (×13): 500 mg via ORAL
  Filled 2024-04-26 (×13): qty 1

## 2024-04-26 MED ORDER — OXYCODONE HCL 5 MG PO TABS
5.0000 mg | ORAL_TABLET | ORAL | Status: DC | PRN
  Administered 2024-04-29 (×2): 10 mg via ORAL
  Administered 2024-04-30: 5 mg via ORAL
  Filled 2024-04-26: qty 2
  Filled 2024-04-26: qty 1
  Filled 2024-04-26: qty 2

## 2024-04-26 MED ORDER — METHOCARBAMOL 500 MG PO TABS
1000.0000 mg | ORAL_TABLET | Freq: Four times a day (QID) | ORAL | Status: DC | PRN
  Administered 2024-04-28 – 2024-04-30 (×3): 1000 mg via ORAL
  Filled 2024-04-26 (×3): qty 2

## 2024-04-26 MED ORDER — BISACODYL 10 MG RE SUPP: 10.0000 mg | Freq: Two times a day (BID) | RECTAL | Status: DC | PRN

## 2024-04-26 MED ORDER — CIPROFLOXACIN IN D5W 400 MG/200ML IV SOLN
400.0000 mg | Freq: Two times a day (BID) | INTRAVENOUS | Status: DC
  Administered 2024-04-26 – 2024-04-28 (×4): 400 mg via INTRAVENOUS
  Filled 2024-04-26 (×4): qty 200

## 2024-04-26 MED ORDER — CALCIUM POLYCARBOPHIL 625 MG PO TABS
625.0000 mg | ORAL_TABLET | Freq: Two times a day (BID) | ORAL | Status: DC
  Administered 2024-04-26 – 2024-04-29 (×8): 625 mg via ORAL
  Filled 2024-04-26 (×9): qty 1

## 2024-04-26 MED ORDER — SODIUM CHLORIDE 0.9 % IV SOLN: 250.0000 mL | INTRAVENOUS | Status: DC | PRN

## 2024-04-26 MED ORDER — SODIUM CHLORIDE 0.9% FLUSH: 3.0000 mL | INTRAVENOUS | Status: DC | PRN

## 2024-04-26 MED ORDER — HYDROMORPHONE HCL 1 MG/ML IJ SOLN
0.5000 mg | INTRAMUSCULAR | Status: DC | PRN
  Administered 2024-04-27: 1 mg via INTRAVENOUS
  Filled 2024-04-26: qty 1

## 2024-04-26 MED ORDER — SODIUM CHLORIDE 0.9 % IV SOLN
3.0000 g | Freq: Four times a day (QID) | INTRAVENOUS | Status: DC
  Administered 2024-04-26 – 2024-04-28 (×7): 3 g via INTRAVENOUS
  Filled 2024-04-26 (×7): qty 8

## 2024-04-26 MED ORDER — PROCHLORPERAZINE EDISYLATE 10 MG/2ML IJ SOLN: 5.0000 mg | INTRAMUSCULAR | Status: DC | PRN

## 2024-04-26 MED ORDER — LACTATED RINGERS IV BOLUS: 1000.0000 mL | Freq: Three times a day (TID) | INTRAVENOUS | Status: DC | PRN

## 2024-04-26 MED ORDER — SALINE SPRAY 0.65 % NA SOLN: 1.0000 | Freq: Four times a day (QID) | NASAL | Status: DC | PRN

## 2024-04-26 NOTE — Progress Notes (Signed)
 Physical Therapy Treatment Patient Details Name: Tony Lopez MRN: [id number] DOB: 02-18-1951 Today's Date: 04/26/2024   History of Present Illness Pt is a 73 y/o male presenting on 5/23 with ETOH withdrawal with DTs.  Complicated by tachycardia, hyponatermia, and AKI. Polymicrobial bacteremia 5/26. S/p  laparoscopic subtotal cholecystectomy with drain placement 5/30. PMH includes: arthritis, Hep C, HTN, prostate cancer.    PT Comments  Reassessed post op day #1 with notable improvement in function. Previously up to max assist +2 for bed mobility and transfers, pt now required min-mod assist for these tasks. LEs still very weak but able to tolerate standing, step pivot transfer, and some pre-gait activities. Good awareness of limitations after standing upright. Tolerated LE exercises well. Likely able to progress with gait training next visit (recommend +2 for safety with chair follow.) Patient will continue to benefit from skilled physical therapy services to further improve independence with functional mobility. Patient will benefit from continued inpatient follow up therapy, <3 hours/day     If plan is discharge home, recommend the following: Assistance with cooking/housework;Assist for transportation;Help with stairs or ramp for entrance;A lot of help with bathing/dressing/bathroom;Two people to help with walking and/or transfers;Direct supervision/assist for financial management;Direct supervision/assist for medications management;Supervision due to cognitive status   Can travel by private vehicle     No (Likely soon)  Equipment Recommendations  Hospital bed;Wheelchair cushion (measurements PT);Wheelchair (measurements PT);BSC/3in1    Recommendations for Other Services       Precautions / Restrictions Precautions Precautions: Fall Recall of Precautions/Restrictions: Impaired Precaution/Restrictions Comments: Rt JP drain Restrictions Weight Bearing Restrictions Per Provider  Order: No     Mobility  Bed Mobility Overal bed mobility: Needs Assistance Bed Mobility: Supine to Sit     Supine to sit: Mod assist, HOB elevated, Used rails     General bed mobility comments: Mod assist, nearly min, with heavy cues for rail use and sequencing of LEs off bed. Able to pull with RUE through therapist's hand to rise.    Transfers Overall transfer level: Needs assistance Equipment used: Rolling walker (2 wheels) Transfers: Sit to/from Stand, Bed to chair/wheelchair/BSC Sit to Stand: From elevated surface, Min assist   Step pivot transfers: Min assist       General transfer comment: Min assist for boost and balance to rise and stabilize at edge of bed with RW for support. LEs tremulous at first but gradually resolved. Min assist for RW control and cues for step sequencing to chair. No overt buckling.    Ambulation/Gait             Pre-gait activities: Pre-gait with min assist for balance and RW placement, static march, weight shift (some difficulty fully clearing feet from floor initially but improved with repetition.)     Stairs             Wheelchair Mobility     Tilt Bed    Modified Rankin (Stroke Patients Only)       Balance Overall balance assessment: Needs assistance Sitting-balance support: No upper extremity supported, Feet supported, Single extremity supported Sitting balance-Leahy Scale: Fair Sitting balance - Comments: supervision for safety EOB no support.   Standing balance support: Bilateral upper extremity supported, Reliant on assistive device for balance Standing balance-Leahy Scale: Poor Standing balance comment: Requires external support                            Communication Communication Communication: No apparent difficulties  Cognition Arousal: Alert Behavior During Therapy: Flat affect   PT - Cognitive impairments: No family/caregiver present to determine baseline, Sequencing, Awareness,  Problem solving, Safety/Judgement                         Following commands: Intact Following commands impaired: Follows multi-step commands with increased time, Follows multi-step commands inconsistently    Cueing Cueing Techniques: Verbal cues, Gestural cues  Exercises General Exercises - Lower Extremity Ankle Circles/Pumps: AROM, Both, 10 reps, Seated Quad Sets: Strengthening, Both, 10 reps, Seated Gluteal Sets: Strengthening, Both, 10 reps, Seated Long Arc Quad: Strengthening, Both, 10 reps, Seated Hip Flexion/Marching: Strengthening, Both, 10 reps, Seated Mini-Sqauts: Strengthening, Both, 10 reps, Standing    General Comments General comments (skin integrity, edema, etc.): SpO2 92% on RA, HR 75, BP 118/69.      Pertinent Vitals/Pain Pain Assessment Pain Assessment: Faces Faces Pain Scale: Hurts little more Pain Location: abdomen when coughing Pain Descriptors / Indicators: Operative site guarding, Sore Pain Intervention(s): Limited activity within patient's tolerance, Monitored during session, Repositioned    Home Living Family/patient expects to be discharged to:: Private residence Living Arrangements: Children                 Additional Comments: pt unable to report    Prior Function            PT Goals (current goals can now be found in the care plan section) Acute Rehab PT Goals Patient Stated Goal: unable to state PT Goal Formulation: With patient/family Time For Goal Achievement: 05/05/24 Potential to Achieve Goals: Good Progress towards PT goals: Progressing toward goals    Frequency    Min 2X/week      PT Plan      Co-evaluation              AM-PAC PT "6 Clicks" Mobility   Outcome Measure  Help needed turning from your back to your side while in a flat bed without using bedrails?: A Little Help needed moving from lying on your back to sitting on the side of a flat bed without using bedrails?: A Lot Help needed moving  to and from a bed to a chair (including a wheelchair)?: A Little Help needed standing up from a chair using your arms (e.g., wheelchair or bedside chair)?: A Little Help needed to walk in hospital room?: Total Help needed climbing 3-5 steps with a railing? : Total 6 Click Score: 13    End of Session Equipment Utilized During Treatment: Gait belt Activity Tolerance: Patient tolerated treatment well (Gait progression limited by fatigue/weakness.) Patient left: with call bell/phone within reach;in chair;with chair alarm set;with SCD's reapplied Nurse Communication: Mobility status PT Visit Diagnosis: Muscle weakness (generalized) (M62.81);Other abnormalities of gait and mobility (R26.89);Unsteadiness on feet (R26.81);Difficulty in walking, not elsewhere classified (R26.2);Pain Pain - part of body:  (abdomen)     Time: 1140-1201 PT Time Calculation (min) (ACUTE ONLY): 21 min  Charges:    $Therapeutic Activity: 8-22 mins PT General Charges $$ ACUTE PT VISIT: 1 Visit                     Jory Ng, PT, DPT [facility] [name] [facility]  Rehabilitation Services Physical Therapist Office: [phone] Website: [email or web address]    Alinda Irani 04/26/2024, 12:56 PM

## 2024-04-26 NOTE — Progress Notes (Signed)
 04/26/2024  Tony Lopez [id number] [birth year]  CARE TEAM: PCP: Diamond Formica, PA  Outpatient Care Team: Patient Care Team: Redmon, Noelle, [name] as PCP - General (Nurse Practitioner)  Inpatient Treatment Team: Treatment Team:  Burton Casey, MD Tyrone Gallop, MD [facility], Md, MD Alinda Irani, PT Kathaleen Pale, RN Adaline Ada, [facility] Kalman Ores, [place] Richardine Chancy, RN Audrea Blender, RN Waunita Haff, RN Swist, Debbie, RN Duffy, Florene Huntington, LCSWA   Problem List:   Principal Problem:   Empyema of gallbladder Active Problems:   Prostatic adenocarcinoma (HCC)   Hepatitis C antibody positive in blood   Alcohol withdrawal (HCC)   Alcoholic hepatitis   Hyponatremia   AKI (acute kidney injury) (HCC)   GI bleed   Acute cholecystitis with chronic cholecystitis   04/25/2024  PRE-OPERATIVE DIAGNOSIS: Acute cholecystitis   POST-OPERATIVE DIAGNOSIS: Acute on chronic cholecystitis with hydrops gallbladder, empyema   PROCEDURE: Laparoscopic subtotal cholecystectomy   SURGEON: Beatris Lincoln, MD   FINDINGS: Acute on chronic cholecystitis with hydrops gallbladder.  Additionally, empyema.  Gallbladder still with clear fluid but additionally significant amounts of pus.  There is pus exuding from the wall of the gallbladder.  Significant inflammation of the pericholecystic tissues with an enlarged liver making cholecystectomy potentially dangerous.  For these reasons, we opted to proceed with a subtotal cholecystectomy.  Substantial portion of the gallbladder wall was removed.  Gallstones were from the gallbladder and a 19 French round Blake drain was placed at completion.      Assessment [facility] Stay = 8 days) 1 Day Post-Op    Stable    Assessment/Plan  E. Coli Bacteremia  Acute cholecystitis with empyema of gallbladder - HIDA positive for cholecystitis - Laparoscopic subtotal cholecystectomy with drain placement 5/23 Dr. Camilo Cella -No nausea or  vomiting.  Try clear liquid diets advance to full's as tolerated Antibiotics x 5 days postop.  Switch to piperacillin/tazobactam. Keep surgical drain for now.  Most likely need need to leave beyond the next few days since high risk of bile leak.  If concern for leak repeat hide and possible ERCP/stenting.  FEN: Clear liquid diet and advance gradually VTE: SCDs ID: Rocephin  -5/31.  Zosyn.   - per [facility] -  Acute alcohol withdrawal with DTs AKI, resolved Chronic thrombocytopenia - plts 135K Left renal lesion - outpatient follow up imaging recommended  Tobacco abuse  I updated the patient's status to the patient  Recommendations were made.  Questions were answered.  He expressed understanding & appreciation.  -Disposition: To be determined.      I reviewed nursing notes, hospitalist notes, last 24 h vitals and pain scores, last 48 h intake and output, last 24 h labs and trends, and last 24 h imaging results.  I have reviewed this patient's available data, including medical history, events of note, test results, etc as part of my evaluation.   A significant portion of that time was spent in counseling. Care during the described time interval was provided by me.  This care required moderate level of medical decision making.  04/26/2024    Subjective: (Chief complaint)  Patient denies much pain or nausea.  On phone.  Tolerating sips.  Thirsty/hungry  Objective:  Vital signs:  Vitals:   04/25/24 2350 04/26/24 0418 04/26/24 0802 04/26/24 0925  BP: 130/82 (!) 114/46 121/70 118/69  Pulse: 72 72 79 87  Resp: 16 18 17  (!) 22  Temp: 98 F (36.7 C) (!) 97.1 F (36.2 C) (!)  97.3 F (36.3 C)   TempSrc: Axillary Axillary Axillary   SpO2: 96% 98% 100% 93%  Weight:      Height:        Last BM Date : 04/21/24  Intake/Output   Yesterday:  05/30 0701 - 05/31 0700 In: 1814.5 [I.V.:900; IV Piggyback:914.5] Out: 795 [Urine:600; Drains:195] This shift:  No intake/output data  recorded.  Bowel function:  Flatus: YES  BM:  No  Drain: Serosanguinous   Physical Exam:  General: Pt awake/alert in no acute distress Eyes: PERRL, normal EOM.  Sclera clear.  No icterus Neuro: CN II-XII intact w/o focal sensory/motor deficits. Lymph: No head/neck/groin lymphadenopathy Psych:  No delerium/psychosis/paranoia.  Oriented x 4 HENT: Normocephalic, Mucus membranes moist.  No thrush Neck: Supple, No tracheal deviation.  No obvious thyromegaly Chest: No pain to chest wall compression.  Good respiratory excursion.  No audible wheezing CV:  Pulses intact.  Regular rhythm.  No major extremity edema MS: Normal AROM mjr joints.  No obvious deformity  Abdomen: Soft.  Mildy distended.  Tenderness at right sided drain site only.  No evidence of peritonitis.  No incarcerated hernias.  Ext:   No deformity.  No mjr edema.  No cyanosis Skin: No petechiae / purpurea.  No major sores.  Warm and dry    Results:   Cultures: Recent Results (from the past 720 hours)  Gastrointestinal Panel by PCR , Stool     Status: None   Collection Time: 04/20/24  6:39 PM   Specimen: Stool  Result Value Ref Range Status   Campylobacter species NOT DETECTED NOT DETECTED Final   Plesimonas shigelloides NOT DETECTED NOT DETECTED Final   Salmonella species NOT DETECTED NOT DETECTED Final   Yersinia enterocolitica NOT DETECTED NOT DETECTED Final   Vibrio species NOT DETECTED NOT DETECTED Final   Vibrio cholerae NOT DETECTED NOT DETECTED Final   Enteroaggregative E coli (EAEC) NOT DETECTED NOT DETECTED Final   Enteropathogenic E coli (EPEC) NOT DETECTED NOT DETECTED Final   Enterotoxigenic E coli (ETEC) NOT DETECTED NOT DETECTED Final   Shiga like toxin producing E coli (STEC) NOT DETECTED NOT DETECTED Final   Shigella/Enteroinvasive E coli (EIEC) NOT DETECTED NOT DETECTED Final   Cryptosporidium NOT DETECTED NOT DETECTED Final   Cyclospora cayetanensis NOT DETECTED NOT DETECTED Final   Entamoeba  histolytica NOT DETECTED NOT DETECTED Final   Giardia lamblia NOT DETECTED NOT DETECTED Final   Adenovirus F40/41 NOT DETECTED NOT DETECTED Final   Astrovirus NOT DETECTED NOT DETECTED Final   Norovirus GI/GII NOT DETECTED NOT DETECTED Final   Rotavirus A NOT DETECTED NOT DETECTED Final   Sapovirus (I, II, IV, and V) NOT DETECTED NOT DETECTED Final    Comment: Performed at [facility], [address] [place], [place] [address]  Culture, blood (Routine X 2) w Reflex to ID Panel     Status: Abnormal   Collection Time: 04/21/24 12:02 PM   Specimen: BLOOD LEFT HAND  Result Value Ref Range Status   Specimen Description BLOOD LEFT HAND  Final   Special Requests   Final    BOTTLES DRAWN AEROBIC AND ANAEROBIC Blood Culture adequate volume   Culture  Setup Time   Final    GRAM NEGATIVE RODS GRAM POSITIVE COCCI ANAEROBIC BOTTLE ONLY GRAM NEGATIVE RODS AEROBIC BOTTLE ONLY CRITICAL RESULT CALLED TO, READ BACK BY AND VERIFIED WITH: J WYLAND,PHARMD@[id number]  04/22/24 MK    Culture (A)  Final    ESCHERICHIA COLI STAPHYLOCOCCUS EPIDERMIDIS THE SIGNIFICANCE  OF ISOLATING THIS ORGANISM FROM A SINGLE SET OF BLOOD CULTURES WHEN MULTIPLE SETS ARE DRAWN IS UNCERTAIN. PLEASE NOTIFY THE MICROBIOLOGY DEPARTMENT WITHIN ONE WEEK IF SPECIATION AND SENSITIVITIES ARE REQUIRED. Performed at [facility], [address], [place], [place] [address]    Report Status 04/25/2024 FINAL  Final   Organism ID, Bacteria ESCHERICHIA COLI  Final   Organism ID, Bacteria ESCHERICHIA COLI  Final      Susceptibility   Escherichia coli - KIRBY BAUER*    CEFAZOLIN  SENSITIVE Sensitive    Escherichia coli - MIC*    AMPICILLIN <=2 SENSITIVE Sensitive     CEFEPIME  <=0.12 SENSITIVE Sensitive     CEFTAZIDIME <=1 SENSITIVE Sensitive     CEFTRIAXONE  <=0.25 SENSITIVE Sensitive     CIPROFLOXACIN <=0.25 SENSITIVE Sensitive     GENTAMICIN <=1 SENSITIVE Sensitive     IMIPENEM <=0.25 SENSITIVE Sensitive     TRIMETH /SULFA   <=20 SENSITIVE Sensitive     AMPICILLIN/SULBACTAM <=2 SENSITIVE Sensitive     PIP/TAZO <=4 SENSITIVE Sensitive ug/mL    * ESCHERICHIA COLI    ESCHERICHIA COLI  Culture, blood (Routine X 2) w Reflex to ID Panel     Status: Abnormal   Collection Time: 04/21/24 12:02 PM   Specimen: BLOOD RIGHT ARM  Result Value Ref Range Status   Specimen Description BLOOD RIGHT ARM  Final   Special Requests   Final    BOTTLES DRAWN AEROBIC ONLY Blood Culture adequate volume   Culture  Setup Time   Final    GRAM NEGATIVE RODS GRAM POSITIVE COCCI AEROBIC BOTTLE ONLY Performed at [facility], [address], [place], [place] [address]    Culture (A)  Final    MORGANELLA MORGANII ESCHERICHIA COLI SUSCEPTIBILITIES PERFORMED ON PREVIOUS CULTURE WITHIN THE LAST 5 DAYS. ENTEROCOCCUS AVIUM    Report Status 04/26/2024 FINAL  Final   Organism ID, Bacteria MORGANELLA MORGANII  Final      Susceptibility   Morganella morganii - MIC*    AMPICILLIN >=32 RESISTANT Resistant     CEFTAZIDIME <=1 SENSITIVE Sensitive     CIPROFLOXACIN <=0.25 SENSITIVE Sensitive     GENTAMICIN <=1 SENSITIVE Sensitive     IMIPENEM 1 SENSITIVE Sensitive     TRIMETH /SULFA  <=20 SENSITIVE Sensitive     AMPICILLIN/SULBACTAM >=32 RESISTANT Resistant     PIP/TAZO <=4 SENSITIVE Sensitive ug/mL    * MORGANELLA MORGANII  Blood Culture ID Panel (Reflexed)     Status: Abnormal   Collection Time: 04/21/24 12:02 PM  Result Value Ref Range Status   Enterococcus faecalis NOT DETECTED NOT DETECTED Final   Enterococcus Faecium NOT DETECTED NOT DETECTED Final   Listeria monocytogenes NOT DETECTED NOT DETECTED Final   Staphylococcus species DETECTED (A) NOT DETECTED Final    Comment: CRITICAL RESULT CALLED TO, READ BACK BY AND VERIFIED WITH: J [facility]  04/22/24 MK    Staphylococcus aureus (BCID) NOT DETECTED NOT DETECTED Final   Staphylococcus epidermidis DETECTED (A) NOT DETECTED Final    Comment: CRITICAL RESULT CALLED TO,  READ BACK BY AND VERIFIED WITH: J [facility]  04/22/24 MK    Staphylococcus lugdunensis NOT DETECTED NOT DETECTED Final   Streptococcus species NOT DETECTED NOT DETECTED Final   Streptococcus agalactiae NOT DETECTED NOT DETECTED Final   Streptococcus pneumoniae NOT DETECTED NOT DETECTED Final   Streptococcus pyogenes NOT DETECTED NOT DETECTED Final   A.calcoaceticus-baumannii NOT DETECTED NOT DETECTED Final   Bacteroides fragilis NOT DETECTED NOT DETECTED Final   Enterobacterales DETECTED (A) NOT  DETECTED Final    Comment: Enterobacterales represent a large order of gram negative bacteria, not a single organism. CRITICAL RESULT CALLED TO, READ BACK BY AND VERIFIED WITH: J WYLAND,PHARMD@0319  04/22/24 MK    Enterobacter cloacae complex NOT DETECTED NOT DETECTED Final   Escherichia coli DETECTED (A) NOT DETECTED Final    Comment: CRITICAL RESULT CALLED TO, READ BACK BY AND VERIFIED WITH: J WYLAND,PHARMD@0319  04/22/24 MK    Klebsiella aerogenes NOT DETECTED NOT DETECTED Final   Klebsiella oxytoca NOT DETECTED NOT DETECTED Final   Klebsiella pneumoniae NOT DETECTED NOT DETECTED Final   Proteus species NOT DETECTED NOT DETECTED Final   Salmonella species NOT DETECTED NOT DETECTED Final   Serratia marcescens NOT DETECTED NOT DETECTED Final   Haemophilus influenzae NOT DETECTED NOT DETECTED Final   Neisseria meningitidis NOT DETECTED NOT DETECTED Final   Pseudomonas aeruginosa NOT DETECTED NOT DETECTED Final   Stenotrophomonas maltophilia NOT DETECTED NOT DETECTED Final   Candida albicans NOT DETECTED NOT DETECTED Final   Candida auris NOT DETECTED NOT DETECTED Final   Candida glabrata NOT DETECTED NOT DETECTED Final   Candida krusei NOT DETECTED NOT DETECTED Final   Candida parapsilosis NOT DETECTED NOT DETECTED Final   Candida tropicalis NOT DETECTED NOT DETECTED Final   Cryptococcus neoformans/gattii NOT DETECTED NOT DETECTED Final   CTX-M ESBL NOT DETECTED NOT DETECTED Final    Carbapenem resistance IMP NOT DETECTED NOT DETECTED Final   Carbapenem resistance KPC NOT DETECTED NOT DETECTED Final   Methicillin resistance mecA/C NOT DETECTED NOT DETECTED Final   Carbapenem resistance NDM NOT DETECTED NOT DETECTED Final   Carbapenem resist OXA 48 LIKE NOT DETECTED NOT DETECTED Final   Carbapenem resistance VIM NOT DETECTED NOT DETECTED Final    Comment: Performed at [facility], [address] [place], [place] [address]  Culture, blood (Routine X 2) w Reflex to ID Panel     Status: None (Preliminary result)   Collection Time: 04/23/24  7:55 AM   Specimen: BLOOD RIGHT ARM  Result Value Ref Range Status   Specimen Description BLOOD RIGHT ARM  Final   Special Requests   Final    BOTTLES DRAWN AEROBIC AND ANAEROBIC Blood Culture results may not be optimal due to an inadequate volume of blood received in culture bottles   Culture   Final    NO GROWTH 3 DAYS Performed at [facility], [address], [place], [place] [address]    Report Status PENDING  Incomplete  Culture, blood (Routine X 2) w Reflex to ID Panel     Status: None (Preliminary result)   Collection Time: 04/23/24  7:57 AM   Specimen: BLOOD LEFT HAND  Result Value Ref Range Status   Specimen Description BLOOD LEFT HAND  Final   Special Requests   Final    BOTTLES DRAWN AEROBIC AND ANAEROBIC Blood Culture adequate volume   Culture   Final    NO GROWTH 3 DAYS Performed at [facility], [address], [place], [place] [address]    Report Status PENDING  Incomplete    Labs: Results for orders placed or performed during the hospital encounter of 04/18/24 (from the past 48 hours)  CBC     Status: Abnormal   Collection Time: 04/25/24  6:10 AM  Result Value Ref Range   WBC 16.7 (H) 4.0 - 10.5 K/uL   RBC 2.34 (L) 4.22 - 5.81 MIL/uL   Hemoglobin 7.8 (L) 13.0 - 17.0 g/dL  HCT 22.8 (L) 39.0 - 52.0 %   MCV 97.4 80.0 - 100.0 fL   MCH 33.3 26.0 - 34.0 pg   MCHC 34.2 30.0 - 36.0 g/dL    RDW 16.1 09.6 - 04.5 %   Platelets 209 150 - 400 K/uL   nRBC 0.1 0.0 - 0.2 %    Comment: Performed at [facility], [address], [place], [place] [address]  Comprehensive metabolic panel with GFR     Status: Abnormal   Collection Time: 04/25/24  6:10 AM  Result Value Ref Range   Sodium 136 135 - 145 mmol/L   Potassium 3.5 3.5 - 5.1 mmol/L   Chloride 105 98 - 111 mmol/L   CO2 22 22 - 32 mmol/L   Glucose, Bld 107 (H) 70 - 99 mg/dL    Comment: Glucose reference range applies only to samples taken after fasting for at least 8 hours.   BUN 13 8 - 23 mg/dL   Creatinine, Ser 1.91 0.61 - 1.24 mg/dL   Calcium 7.7 (L) 8.9 - 10.3 mg/dL   Total Protein 5.2 (L) 6.5 - 8.1 g/dL   Albumin  1.8 (L) 3.5 - 5.0 g/dL   AST 38 15 - 41 U/L   ALT 31 0 - 44 U/L   Alkaline Phosphatase 126 38 - 126 U/L   Total Bilirubin 1.9 (H) 0.0 - 1.2 mg/dL   GFR, Estimated >47 >82 mL/min    Comment: (NOTE) Calculated using the CKD-EPI Creatinine Equation (2021)    Anion gap 9 5 - 15    Comment: Performed at [facility], [address] [place], [place] [address]  Magnesium      Status: None   Collection Time: 04/25/24  6:10 AM  Result Value Ref Range   Magnesium  1.7 1.7 - 2.4 mg/dL    Comment: Performed at [facility], [address] [place], [place] [address]  Phosphorus     Status: Abnormal   Collection Time: 04/25/24  6:10 AM  Result Value Ref Range   Phosphorus 2.2 (L) 2.5 - 4.6 mg/dL    Comment: Performed at [facility], [address] [place], [place] [address]  Glucose, capillary     Status: Abnormal   Collection Time: 04/25/24 11:20 AM  Result Value Ref Range   Glucose-Capillary 112 (H) 70 - 99 mg/dL    Comment: Glucose reference range applies only to samples taken after fasting for at least 8 hours.  Type and screen [facility]     Status: None   Collection Time: 04/25/24  1:07 PM  Result Value Ref Range   ABO/RH(D) A POS    Antibody Screen NEG     Sample Expiration      04/28/2024,2359 Performed at [facility], [address] [place], [place] [address]   CBC     Status: Abnormal   Collection Time: 04/26/24  8:11 AM  Result Value Ref Range   WBC 15.1 (H) 4.0 - 10.5 K/uL   RBC 2.04 (L) 4.22 - 5.81 MIL/uL   Hemoglobin 7.0 (L) 13.0 - 17.0 g/dL    Comment: REPEATED TO VERIFY   HCT 20.7 (L) 39.0 - 52.0 %   MCV 101.5 (H) 80.0 - 100.0 fL   MCH 34.3 (H) 26.0 - 34.0 pg   MCHC 33.8 30.0 - 36.0 g/dL   RDW 84.1 32.4 - 40.1 %   Platelets 260 150 - 400 K/uL   nRBC 0.0  0.0 - 0.2 %    Comment: Performed at [facility], [address] [place], [place] [address]    Imaging / Studies: No results found.  Medications / Allergies: per chart  Antibiotics: Anti-infectives (From admission, onward)    Start     Dose/Rate Route Frequency Ordered Stop   04/26/24 1400  DAPTOmycin  (CUBICIN ) IVPB 700 mg/111mL premix        8 mg/kg  88.3 kg 200 mL/hr over 30 Minutes Intravenous Daily 04/25/24 1552     04/26/24 1200  piperacillin-tazobactam (ZOSYN) IVPB 3.375 g        3.375 g 12.5 mL/hr over 240 Minutes Intravenous Every 8 hours 04/26/24 0941 05/01/24 1359   04/25/24 1345  DAPTOmycin  (CUBICIN ) IVPB 700 mg/127mL premix        8 mg/kg  88.3 kg 200 mL/hr over 30 Minutes Intravenous STAT 04/25/24 1332 04/25/24 1419   04/23/24 1430  ceFEPIme  (MAXIPIME ) 2 g in sodium chloride  0.9 % 100 mL IVPB  Status:  Discontinued        2 g 200 mL/hr over 30 Minutes Intravenous Every 8 hours 04/23/24 1337 04/26/24 0941   04/22/24 0400  cefTRIAXone  (ROCEPHIN ) 2 g in sodium chloride  0.9 % 100 mL IVPB  Status:  Discontinued        2 g 200 mL/hr over 30 Minutes Intravenous Every 24 hours 04/22/24 0333 04/23/24 1337         Note: Portions of this report may have been transcribed using voice recognition software. Every effort was made to ensure accuracy; however, inadvertent computerized transcription errors may be present.   Any transcriptional  errors that result from this process are unintentional.    Eddye Goodie, MD, FACS, MASCRS Esophageal, Gastrointestinal & Colorectal Surgery Robotic and Minimally Invasive Surgery  [facility] [address] [place], [place] [address] [phone] Fax [phone] Main  CONTACT INFORMATION: Weekday (9AM-5PM): Call [facility] main office at [phone] Weeknight (5PM-9AM) or Weekend/Holiday: Check EPIC "Web Links" tab & use "AMION" (password " [email or web address]") for General Surgery [facility] coverage  Please, DO NOT use SecureChat  (it is not reliable communication to reach operating surgeons & will lead to a delay in care).   Epic staff messaging available for outptient concerns needing 1-2 business day response.      04/26/2024  10:30 AM

## 2024-04-26 NOTE — Plan of Care (Signed)
  Problem: Education: Goal: Knowledge of General Education information will improve Description: Including pain rating scale, medication(s)/side effects and non-pharmacologic comfort measures Outcome: Progressing   Problem: Health Behavior/Discharge Planning: Goal: Ability to manage health-related needs will improve Outcome: Progressing   Problem: Clinical Measurements: Goal: Will remain free from infection Outcome: Progressing Goal: Diagnostic test results will improve Outcome: Progressing Goal: Cardiovascular complication will be avoided Outcome: Progressing   Problem: Activity: Goal: Risk for activity intolerance will decrease Outcome: Progressing   Problem: Nutrition: Goal: Adequate nutrition will be maintained Outcome: Progressing   Problem: Coping: Goal: Level of anxiety will decrease Outcome: Progressing   Problem: Safety: Goal: Ability to remain free from injury will improve Outcome: Progressing

## 2024-04-26 NOTE — Consult Note (Signed)
 [facility]    Date of Admission:  04/18/2024     Reason for Consult: polymicrobial bacteremia including enterococcus avium, cholecystitis    Referring Provider: Jonita Neth; Shanker Ghimire    Abx: 5/30-c dapto 5/31-c piptazo  5/28-30 cefepime  5/27-28 ceftriaxone         Assessment: 73 yo male hx prostate cancer, hx lower back surgery (per his report no hardware), htn, gerd, etoh abuse admitted initially on 5/23 for delirium tremens, course complicated by polymicrobial bacteremia in setting of acute calculus cholecystitis  5/25 gi pcr negative 5/26 fever -- bcx gnr -- ultimately grow e avium also on 5/30 5/28 repeat bcx ngtd   5/23 abd pelv ct no abnormality 5/24 ruq u/s sludge/small gall stones 5/27 repeat ruq u/s distended gallbladder with stone/sludge 5/28 hida scan positive   S/p subtotal lap chole 5/30 (severe adhesion present) and gallbladder wound bed drain placed  Given the avium with clear source and low burden and lower risk for IE, will defer TEE Mild back pain no hardware can continue to monitor - low suspicion for metastatic infection     Plan: Reviewed abx susceptibility and will change abx to the following Cipro -- morganella (amp-c) Amp-sulb for e avium, ecoli Abx duration will both be 2 weeks from 5/30 until 05/09/24; I typically do 1 week after drainage/I&D of the gall bladder, but the surgical finding rather concerning Maintain standard isolation precaution Tte was ordered; if normal I do not feel strong inclination to do tee for reason above mentioned Discussed with primary team      ------------------------------------------------ Principal Problem:   Empyema of gallbladder Active Problems:   Prostatic adenocarcinoma (HCC)   Hepatitis C antibody positive in blood   Alcohol withdrawal (HCC)   Alcoholic hepatitis   Hyponatremia   AKI (acute kidney injury) (HCC)   GI bleed   Acute cholecystitis with  chronic cholecystitis    HPI: Tony Lopez is a 73 y.o. male here for etoh withdrawal found to have sepsis/polymicrobial bacteremia in setting chronic cholecystitis  Hx reviewed via chart and discussed with patient  Patient recently lost job and has been drinking more heavily 1 week felt unwell with n/v/abd pain  Has prior lower back surgery no harware and chronic mild pain   On presentation did have fever within 3 days of admission Initial lft high but abd pelv ct no liver pathology Bcx ecoli/morganella/e avium Ongoing abd pain --> Imaging/hida concerning for cholecystitis  Taken to or on 5/30 Reviewed 5/30 operative note "Acute on chronic cholecystitis with hydrops gallbladder.  Additionally, empyema.  Gallbladder still with clear fluid but additionally significant amounts of pus.  There is pus exuding from the wall of the gallbladder.  Significant inflammation of the pericholecystic tissues with an enlarged liver making cholecystectomy potentially dangerous.  For these reasons, we opted to proceed with a subtotal cholecystectomy.  Substantial portion of the gallbladder wall was removed.  Gallstones were from the gallbladder and a 19 French round Blake drain was placed at completion.   Has a drain there now  Mild lower back pain worsened blaming it on bed Feels well otherwise  Eating  Repeat bcx 2 days after initial cx ngtd   Family History  Problem Relation Age of Onset   Lung cancer Mother    Prostate cancer Neg Hx     Social History   Tobacco Use   Smoking status: Every Day    Current packs/day:  1.00    Average packs/day: 1 pack/day for 35.0 years (35.0 ttl pk-yrs)    Types: Cigarettes   Smokeless tobacco: Never  Substance Use Topics   Alcohol use: Yes    Comment: SELDOM    Drug use: No    No Known Allergies  Review of Systems: ROS All Other ROS was negative, except mentioned above   Past Medical History:  Diagnosis Date   Arthritis    GERD  (gastroesophageal reflux disease)    Hepatitis C    Hypertension    Prostate cancer (HCC)        Scheduled Meds:  acetaminophen   500 mg Oral TID WC & HS   allopurinol   100 mg Oral Daily   feeding supplement  237 mL Oral BID BM   folic acid   1 mg Oral Daily   ipratropium  0.5 mg Nebulization BID   levalbuterol   0.63 mg Nebulization BID   lidocaine   1 patch Transdermal Q24H   metoprolol  tartrate  25 mg Oral BID   multivitamin with minerals  1 tablet Oral Daily   nicotine   21 mg Transdermal Daily   pantoprazole  (PROTONIX ) IV  40 mg Intravenous Q12H   polycarbophil  625 mg Oral BID   sodium chloride  flush  3 mL Intravenous Q12H   thiamine   100 mg Oral Daily   Or   thiamine   100 mg Intravenous Daily   Continuous Infusions:  sodium chloride      DAPTOmycin      lactated ringers      piperacillin-tazobactam (ZOSYN)  IV 3.375 g (04/26/24 1231)   PRN Meds:.sodium chloride , acetaminophen , alum & mag hydroxide-simeth, bisacodyl, diphenhydrAMINE , HYDROmorphone  (DILAUDID ) injection, lactated ringers , loperamide , magic mouthwash, menthol-cetylpyridinium, methocarbamol (ROBAXIN) injection **OR** methocarbamol, naphazoline-glycerin, ondansetron  **OR** ondansetron  (ZOFRAN ) IV, oxyCODONE , phenol, prochlorperazine, simethicone, sodium chloride , sodium chloride  flush   OBJECTIVE: Blood pressure 118/69, pulse 87, temperature (!) 97.3 F (36.3 C), temperature source Axillary, resp. rate (!) 22, height 6' (1.829 m), weight 88.3 kg, SpO2 93%.  Physical Exam General/constitutional: no distress, pleasant HEENT: Normocephalic, PER, Conj Clear, EOMI, Oropharynx clear Neck supple CV: rrr no mrg Lungs: clear to auscultation, normal respiratory effort Abd: Soft, Nontender - right lower quadrant drain present Ext: no edema Skin: No Rash Neuro: nonfocal MSK: no peripheral joint swelling/tenderness/warmth; back spines nontender    Lab Results Lab Results  Component Value Date   WBC 15.1 (H)  04/26/2024   HGB 7.0 (L) 04/26/2024   HCT 20.7 (L) 04/26/2024   MCV 101.5 (H) 04/26/2024   PLT 260 04/26/2024    Lab Results  Component Value Date   CREATININE 0.76 04/26/2024   BUN 12 04/26/2024   NA 139 04/26/2024   K 3.8 04/26/2024   CL 108 04/26/2024   CO2 23 04/26/2024    Lab Results  Component Value Date   ALT 31 04/25/2024   AST 38 04/25/2024   ALKPHOS 126 04/25/2024   BILITOT 1.9 (H) 04/25/2024      Microbiology: Recent Results (from the past 240 hours)  Gastrointestinal Panel by PCR , Stool     Status: None   Collection Time: 04/20/24  6:39 PM   Specimen: Stool  Result Value Ref Range Status   Campylobacter species NOT DETECTED NOT DETECTED Final   Plesimonas shigelloides NOT DETECTED NOT DETECTED Final   Salmonella species NOT DETECTED NOT DETECTED Final   Yersinia enterocolitica NOT DETECTED NOT DETECTED Final   Vibrio species NOT DETECTED NOT DETECTED Final   Vibrio cholerae NOT DETECTED  NOT DETECTED Final   Enteroaggregative E coli (EAEC) NOT DETECTED NOT DETECTED Final   Enteropathogenic E coli (EPEC) NOT DETECTED NOT DETECTED Final   Enterotoxigenic E coli (ETEC) NOT DETECTED NOT DETECTED Final   Shiga like toxin producing E coli (STEC) NOT DETECTED NOT DETECTED Final   Shigella/Enteroinvasive E coli (EIEC) NOT DETECTED NOT DETECTED Final   Cryptosporidium NOT DETECTED NOT DETECTED Final   Cyclospora cayetanensis NOT DETECTED NOT DETECTED Final   Entamoeba histolytica NOT DETECTED NOT DETECTED Final   Giardia lamblia NOT DETECTED NOT DETECTED Final   Adenovirus F40/41 NOT DETECTED NOT DETECTED Final   Astrovirus NOT DETECTED NOT DETECTED Final   Norovirus GI/GII NOT DETECTED NOT DETECTED Final   Rotavirus A NOT DETECTED NOT DETECTED Final   Sapovirus (I, II, IV, and V) NOT DETECTED NOT DETECTED Final    Comment: Performed at [facility], [address] [place], [place] [address]  Culture, blood (Routine X 2) w Reflex to ID Panel      Status: Abnormal   Collection Time: 04/21/24 12:02 PM   Specimen: BLOOD LEFT HAND  Result Value Ref Range Status   Specimen Description BLOOD LEFT HAND  Final   Special Requests   Final    BOTTLES DRAWN AEROBIC AND ANAEROBIC Blood Culture adequate volume   Culture  Setup Time   Final    GRAM NEGATIVE RODS GRAM POSITIVE COCCI ANAEROBIC BOTTLE ONLY GRAM NEGATIVE RODS AEROBIC BOTTLE ONLY CRITICAL RESULT CALLED TO, READ BACK BY AND VERIFIED WITH: J WYLAND,PHARMD@[id number]  04/22/24 MK    Culture (A)  Final    ESCHERICHIA COLI STAPHYLOCOCCUS EPIDERMIDIS THE SIGNIFICANCE OF ISOLATING THIS ORGANISM FROM A SINGLE SET OF BLOOD CULTURES WHEN MULTIPLE SETS ARE DRAWN IS UNCERTAIN. PLEASE NOTIFY THE MICROBIOLOGY DEPARTMENT WITHIN ONE WEEK IF SPECIATION AND SENSITIVITIES ARE REQUIRED. Performed at [facility], [address], [place], [place] [address]    Report Status 04/25/2024 FINAL  Final   Organism ID, Bacteria ESCHERICHIA COLI  Final   Organism ID, Bacteria ESCHERICHIA COLI  Final      Susceptibility   Escherichia coli - KIRBY BAUER*    CEFAZOLIN  SENSITIVE Sensitive    Escherichia coli - MIC*    AMPICILLIN <=2 SENSITIVE Sensitive     CEFEPIME  <=0.12 SENSITIVE Sensitive     CEFTAZIDIME <=1 SENSITIVE Sensitive     CEFTRIAXONE  <=0.25 SENSITIVE Sensitive     CIPROFLOXACIN <=0.25 SENSITIVE Sensitive     GENTAMICIN <=1 SENSITIVE Sensitive     IMIPENEM <=0.25 SENSITIVE Sensitive     TRIMETH /SULFA  <=20 SENSITIVE Sensitive     AMPICILLIN/SULBACTAM <=2 SENSITIVE Sensitive     PIP/TAZO <=4 SENSITIVE Sensitive ug/mL    * ESCHERICHIA COLI    ESCHERICHIA COLI  Culture, blood (Routine X 2) w Reflex to ID Panel     Status: Abnormal   Collection Time: 04/21/24 12:02 PM   Specimen: BLOOD RIGHT ARM  Result Value Ref Range Status   Specimen Description BLOOD RIGHT ARM  Final   Special Requests   Final    BOTTLES DRAWN AEROBIC ONLY Blood Culture adequate volume   Culture  Setup Time   Final     GRAM NEGATIVE RODS GRAM POSITIVE COCCI AEROBIC BOTTLE ONLY Performed at [facility], [address] [place], [place] [address]    Culture (A)  Final    MORGANELLA MORGANII ESCHERICHIA COLI SUSCEPTIBILITIES PERFORMED ON PREVIOUS CULTURE WITHIN THE LAST 5 DAYS. ENTEROCOCCUS AVIUM    Report Status  04/26/2024 FINAL  Final   Organism ID, Bacteria MORGANELLA MORGANII  Final      Susceptibility   Morganella morganii - MIC*    AMPICILLIN >=32 RESISTANT Resistant     CEFTAZIDIME <=1 SENSITIVE Sensitive     CIPROFLOXACIN <=0.25 SENSITIVE Sensitive     GENTAMICIN <=1 SENSITIVE Sensitive     IMIPENEM 1 SENSITIVE Sensitive     TRIMETH /SULFA  <=20 SENSITIVE Sensitive     AMPICILLIN/SULBACTAM >=32 RESISTANT Resistant     PIP/TAZO <=4 SENSITIVE Sensitive ug/mL    * MORGANELLA MORGANII  Blood Culture ID Panel (Reflexed)     Status: Abnormal   Collection Time: 04/21/24 12:02 PM  Result Value Ref Range Status   Enterococcus faecalis NOT DETECTED NOT DETECTED Final   Enterococcus Faecium NOT DETECTED NOT DETECTED Final   Listeria monocytogenes NOT DETECTED NOT DETECTED Final   Staphylococcus species DETECTED (A) NOT DETECTED Final    Comment: CRITICAL RESULT CALLED TO, READ BACK BY AND VERIFIED WITH: J WYLAND,PHARMD@0319  04/22/24 MK    Staphylococcus aureus (BCID) NOT DETECTED NOT DETECTED Final   Staphylococcus epidermidis DETECTED (A) NOT DETECTED Final    Comment: CRITICAL RESULT CALLED TO, READ BACK BY AND VERIFIED WITH: J WYLAND,PHARMD@0319  04/22/24 MK    Staphylococcus lugdunensis NOT DETECTED NOT DETECTED Final   Streptococcus species NOT DETECTED NOT DETECTED Final   Streptococcus agalactiae NOT DETECTED NOT DETECTED Final   Streptococcus pneumoniae NOT DETECTED NOT DETECTED Final   Streptococcus pyogenes NOT DETECTED NOT DETECTED Final   A.calcoaceticus-baumannii NOT DETECTED NOT DETECTED Final   Bacteroides fragilis NOT DETECTED NOT DETECTED Final   Enterobacterales  DETECTED (A) NOT DETECTED Final    Comment: Enterobacterales represent a large order of gram negative bacteria, not a single organism. CRITICAL RESULT CALLED TO, READ BACK BY AND VERIFIED WITH: J [facility]  04/22/24 MK    Enterobacter cloacae complex NOT DETECTED NOT DETECTED Final   Escherichia coli DETECTED (A) NOT DETECTED Final    Comment: CRITICAL RESULT CALLED TO, READ BACK BY AND VERIFIED WITH: J [facility]  04/22/24 MK    Klebsiella aerogenes NOT DETECTED NOT DETECTED Final   Klebsiella oxytoca NOT DETECTED NOT DETECTED Final   Klebsiella pneumoniae NOT DETECTED NOT DETECTED Final   Proteus species NOT DETECTED NOT DETECTED Final   Salmonella species NOT DETECTED NOT DETECTED Final   Serratia marcescens NOT DETECTED NOT DETECTED Final   Haemophilus influenzae NOT DETECTED NOT DETECTED Final   Neisseria meningitidis NOT DETECTED NOT DETECTED Final   Pseudomonas aeruginosa NOT DETECTED NOT DETECTED Final   Stenotrophomonas maltophilia NOT DETECTED NOT DETECTED Final   Candida albicans NOT DETECTED NOT DETECTED Final   Candida auris NOT DETECTED NOT DETECTED Final   Candida glabrata NOT DETECTED NOT DETECTED Final   Candida krusei NOT DETECTED NOT DETECTED Final   Candida parapsilosis NOT DETECTED NOT DETECTED Final   Candida tropicalis NOT DETECTED NOT DETECTED Final   Cryptococcus neoformans/gattii NOT DETECTED NOT DETECTED Final   CTX-M ESBL NOT DETECTED NOT DETECTED Final   Carbapenem resistance IMP NOT DETECTED NOT DETECTED Final   Carbapenem resistance KPC NOT DETECTED NOT DETECTED Final   Methicillin resistance mecA/C NOT DETECTED NOT DETECTED Final   Carbapenem resistance NDM NOT DETECTED NOT DETECTED Final   Carbapenem resist OXA 48 LIKE NOT DETECTED NOT DETECTED Final   Carbapenem resistance VIM NOT DETECTED NOT DETECTED Final    Comment: Performed at [facility], [address] [place], [place] [address]  Culture, blood (Routine X  2) w Reflex  to ID Panel     Status: None (Preliminary result)   Collection Time: 04/23/24  7:55 AM   Specimen: BLOOD RIGHT ARM  Result Value Ref Range Status   Specimen Description BLOOD RIGHT ARM  Final   Special Requests   Final    BOTTLES DRAWN AEROBIC AND ANAEROBIC Blood Culture results may not be optimal due to an inadequate volume of blood received in culture bottles   Culture   Final    NO GROWTH 3 DAYS Performed at [facility], [address] [place], [place] [address]    Report Status PENDING  Incomplete  Culture, blood (Routine X 2) w Reflex to ID Panel     Status: None (Preliminary result)   Collection Time: 04/23/24  7:57 AM   Specimen: BLOOD LEFT HAND  Result Value Ref Range Status   Specimen Description BLOOD LEFT HAND  Final   Special Requests   Final    BOTTLES DRAWN AEROBIC AND ANAEROBIC Blood Culture adequate volume   Culture   Final    NO GROWTH 3 DAYS Performed at [facility], [address], [place], [place] [address]    Report Status PENDING  Incomplete     Serology:    Imaging: If present, new imagings (plain films, ct scans, and mri) have been personally visualized and interpreted; radiology reports have been reviewed. Decision making incorporated into the Impression / Recommendations.  5/28 hida scan Non visualization of the gallbladder compatible with occluded cystic duct/cholecystitis.  5/23 ct abd pelv 1. No acute inflammatory process identified within the abdomen or pelvis. No nephroureterolithiasis or obstructive uropathy on either side. 2. There is a partially exophytic approximately 1.6 x 2.4 cm mildly hypoattenuating structure arising from the left kidney interpolar region, laterally, with internal CT attenuation of 26-32 Hounsfield units. This cannot be characterized as a simple cyst. This is incompletely characterized on the current exam but favored to represent a proteinaceous/hemorrhagic cyst. Comparison can be made if prior  imaging is available. Otherwise, consider further evaluation with ultrasound. 3. No metastatic prostate carcinoma seen in the abdomen or pelvis. 4. Multiple other nonacute observations, as described above.  Jamesetta Mcbride, MD [facility] [phone] pager    04/26/2024, 12:47 PM ]

## 2024-04-26 NOTE — Progress Notes (Signed)
 Pharmacy Antibiotic Note  Tony Lopez is a 73 y.o. male admitted on 04/18/2024 with AWS with DT, complicated by polymicrobial bacteremia and acute calculus cholecystitiss/p cholecystectomy 5/30.  Pharmacy has been consulted for amp-sulbactam dosing.  Plan: Unasyn 3g q6h IV Ciprofloxacin F/u drain output, clinical s/sx improvement F/u DoT  Height: 6' (182.9 cm) Weight: 88.3 kg (194 lb 10.7 oz) IBW/kg (Calculated) : 77.6  Temp (24hrs), Avg:98 F (36.7 C), Min:97.1 F (36.2 C), Max:99.9 F (37.7 C)  Recent Labs  Lab 04/22/24 0329 04/23/24 0325 04/24/24 0451 04/25/24 0610 04/26/24 0811  WBC 10.3 11.2* 12.5* 16.7* 15.1*  CREATININE 0.71 0.72 0.83 0.79 0.76    Estimated Creatinine Clearance: 93 mL/min (by C-G formula based on SCr of 0.76 mg/dL).    No Known Allergies  Antimicrobials this admission: Ceftriaxone  5/27 >>5/28 Cefepime  5/28>5/31 Dapto 5/30 >> 5/31 Zosyn 5/31  Cipro 5/31 >> Unasyn 5/31 >>  Microbiology results: 5/26 BCID: in 1 set: Staph epi (anaerobic bottle), E. Coli (anaerobic + aerobic bottle), no R 5/26 blood>>e.coli, morganella (R-amp, unasyn), E avium  Thank you for allowing pharmacy to be a part of this patient's care.  Heddy Liverpool, PharmD PGY2 Critical Care Pharmacy Resident 04/26/2024 3:08 PM

## 2024-04-26 NOTE — Progress Notes (Signed)
 PROGRESS NOTE        PATIENT DETAILS Name: Tony Lopez Age: 73 y.o. Sex: male Date of Birth: 10-19-1951 Admit Date: 04/18/2024 Admitting Physician [name], MD [id number], [name], [name]  Brief Summary: Patient is a 73 y.o.  male with history of HTN, prostate cancer, GERD, EtOH use-who was admitted for alcohol withdrawal with delirium tremens.  Hospital course complicated by polymicrobial bacteremia and acute calculus cholecystitis.    Significant events: 5/23>> admit to [facility]. 5/26>>Fever-blood culture- gm neg rod/gm +ve rod 5/27>> RUQ tender-[facility] consulted. 5/28>> HIDA scan +ve  Significant studies: 5/23>> CT head: No acute intracranial abnormality 5/23>> CT abdomen/pelvis: No acute abnormality. 5/24>> RUQ ultrasound: Biliary sludge/small gallstones.  No evidence of acute cholecystitis. 5/27>> RUQ ultrasound: Distended gallbladder with cholelithiasis/sludge. 5/28>> HIDA scan: Nonvisualization of the gallbladder  Significant microbiology data: 5/25>> GI pathogen panel: Negative 5/26>> blood culture: E. coli/Morganella/gram-positive cocci (BCID staph epi/E. coli-suspect staph epi is a contamination) 5/28>> blood cultures: No growth  Procedures: 5/30>> laparoscopic subtotal cholecystectomy with drain placement.  Consults: GI [facility]CS ID  Subjective: Awake/alert this morning-slow speech but significantly better than how he was for the past several days.  Objective: Vitals: Blood pressure 118/69, pulse 87, temperature (!) 97.3 F (36.3 C), temperature source Axillary, resp. rate (!) 22, height 6' (1.829 m), weight 88.3 kg, SpO2 93%.   Exam: Much more awake/alert Not in any distress CVS: S1-S2 regular Chest: Clear to auscultation Abdomen: Soft appropriately tender in the RUQ area Nonfocal exam but with generalized weakness.   Pertinent Labs/Radiology:    Latest Ref Rng & Units 04/26/2024    8:11 AM 04/25/2024    6:10 AM 04/24/2024     4:51 AM  CBC  WBC 4.0 - 10.5 K/uL 15.1  16.7  12.5   Hemoglobin 13.0 - 17.0 g/dL 7.0  7.8  7.5   Hematocrit 39.0 - 52.0 % 20.7  22.8  22.4   Platelets 150 - 400 K/uL 260  209  136     Lab Results  Component Value Date   NA 139 04/26/2024   K 3.8 04/26/2024   CL 108 04/26/2024   CO2 23 04/26/2024      Assessment/Plan: EtOH withdrawal with DTs Per psych NP documentation-last drink was on 5/22 (1 quart of whiskey will last every 2 days)  Clinically improved-DTs/EtOH withdrawal symptoms have essentially resolved-he should be out of the window for withdrawal symptoms.  He has some intermittent delirium which is likely hospital induced delirium.   He had an uneventful night-holding off on starting Seroquel unless he develops more delirium.  Sinus tachycardia with PACs Secondary to DTs Tachycardia has essentially resolved-with treatment of underlying DTs-and with initiation of low-dose beta-blockers.  Polymicrobial bacteremia (E. coli/Morganella/Enterococcus) E acute calculus cholecystitis S/p subtotal laparoscopic cholecystectomy 5/13 General Surgery has switch Rocephin  to Zosyn Remains on daptomycin  TTE ordered 5/31 ID following -will await further recommendations. Thankfully repeat blood cultures negative so far.  AKI Hemodynamically mediated-poor oral intake/use of lisinopril /HCTZ Resolved with supportive care.  Hyponatremia Secondary to dehydration/poor oral intake/HCTZ use Resolved.  Hypomagnesemia/hypophosphatemia Replete/recheck.  Diarrhea Prn immodium GI pathogen panel negative Thankfully diarrhea has resolved.  Elevated lipase level Initially not felt to have abdominal pain-no evidence of pancreatitis on CT However on 5/27-RUQ pain-see above regarding plans for repeat RUQ ultrasound given gram-negative bacteremia.    EtOH hepatitis Transaminases  have essentially normalized with supportive care Per GI note-no indication for steroid as discriminant function  I<10.  Thrombocytopenia Secondary to EtOH use Resolved.  Normocytic anemia Probably related to EtOH related bone marrow suppression No evidence of acute blood loss-although FOBT positive Hb continues to slowly trend down-watch closely-if decreases any further will require blood transfusion. Type and screen with a.m. labs.  Guaiac positive stool Outpatient GI follow-up-as no evidence of active bleeding.  Hb stable.  1.6 x 2.4 cm structure arising from left kidney Incidental finding on CT abdomen. Per radiology-favored to represent proteinaceous/hemorrhagic cyst Will need outpatient imaging postdischarge.  Tobacco abuse Transdermal nicotine  Some wheezing today-longstanding history-unclear if he has underlying COPD-starting bronchodilators and seeing how he does.  Debility/deconditioning Secondary to acute illness PT OT eval-SNF recommended  BMI: Estimated body mass index is 26.4 kg/m as calculated from the following:   Height as of this encounter: 6' (1.829 m).   Weight as of this encounter: 88.3 kg.   Code status:   Code Status: Full Code   DVT Prophylaxis: SCDs Start: 04/18/24 2024   Family Communication: Ex wife-Nikki Podgorski [phone] updated over the phone 5/31   Disposition Plan: Status is: Inpatient Remains inpatient appropriate because: Severity of illness   Planned Discharge Destination: SNF   Diet: Diet Order             Diet clear liquid Room service appropriate? Yes; Fluid consistency: Thin  Diet effective now                     Antimicrobial agents: Anti-infectives (From admission, onward)    Start     Dose/Rate Route Frequency Ordered Stop   04/26/24 1400  DAPTOmycin  (CUBICIN ) IVPB 700 mg/185mL premix        8 mg/kg  88.3 kg 200 mL/hr over 30 Minutes Intravenous Daily 04/25/24 1552     04/26/24 1200  piperacillin-tazobactam (ZOSYN) IVPB 3.375 g        3.375 g 12.5 mL/hr over 240 Minutes Intravenous Every 8 hours 04/26/24 0941  05/01/24 1359   04/25/24 1345  DAPTOmycin  (CUBICIN ) IVPB 700 mg/162mL premix        8 mg/kg  88.3 kg 200 mL/hr over 30 Minutes Intravenous STAT 04/25/24 1332 04/25/24 1419   04/23/24 1430  ceFEPIme  (MAXIPIME ) 2 g in sodium chloride  0.9 % 100 mL IVPB  Status:  Discontinued        2 g 200 mL/hr over 30 Minutes Intravenous Every 8 hours 04/23/24 1337 04/26/24 0941   04/22/24 0400  cefTRIAXone  (ROCEPHIN ) 2 g in sodium chloride  0.9 % 100 mL IVPB  Status:  Discontinued        2 g 200 mL/hr over 30 Minutes Intravenous Every 24 hours 04/22/24 0333 04/23/24 1337        MEDICATIONS: Scheduled Meds:  acetaminophen   500 mg Oral TID WC & HS   allopurinol   100 mg Oral Daily   feeding supplement  237 mL Oral BID BM   folic acid   1 mg Oral Daily   ipratropium  0.5 mg Nebulization BID   levalbuterol   0.63 mg Nebulization BID   lidocaine   1 patch Transdermal Q24H   metoprolol  tartrate  25 mg Oral BID   multivitamin with minerals  1 tablet Oral Daily   nicotine   21 mg Transdermal Daily   pantoprazole  (PROTONIX ) IV  40 mg Intravenous Q12H   polycarbophil  625 mg Oral BID   sodium chloride  flush  3 mL Intravenous  Q12H   thiamine   100 mg Oral Daily   Or   thiamine   100 mg Intravenous Daily   Continuous Infusions:  sodium chloride      DAPTOmycin      lactated ringers      piperacillin-tazobactam (ZOSYN)  IV     PRN Meds:.sodium chloride , acetaminophen , alum & mag hydroxide-simeth, bisacodyl, diphenhydrAMINE , HYDROmorphone  (DILAUDID ) injection, lactated ringers , loperamide , magic mouthwash, menthol-cetylpyridinium, methocarbamol (ROBAXIN) injection **OR** methocarbamol, naphazoline-glycerin, ondansetron  **OR** ondansetron  (ZOFRAN ) IV, oxyCODONE , phenol, prochlorperazine, simethicone, sodium chloride , sodium chloride  flush   I have personally reviewed following labs and imaging studies  LABORATORY DATA: CBC: Recent Labs  Lab 04/22/24 0329 04/23/24 0325 04/24/24 0451 04/25/24 0610  04/26/24 0811  WBC 10.3 11.2* 12.5* 16.7* 15.1*  HGB 8.4* 8.3* 7.5* 7.8* 7.0*  HCT 24.2* 24.3* 22.4* 22.8* 20.7*  MCV 95.7 97.2 97.8 97.4 101.5*  PLT 122* 135* 136* 209 260    Basic Metabolic Panel: Recent Labs  Lab 04/21/24 1343 04/22/24 0329 04/23/24 0325 04/24/24 0451 04/25/24 0610 04/26/24 0811  NA 134* 135 135 139 136 139  K 3.7 3.2* 3.9 3.6 3.5 3.8  CL 98 101 102 106 105 108  CO2 26 25 25 23 22 23   GLUCOSE 152* 114* 101* 99 107* 104*  BUN 18 14 11 12 13 12   CREATININE 0.80 0.71 0.72 0.83 0.79 0.76  CALCIUM 8.2* 7.8* 7.7* 7.7* 7.7* 7.5*  MG  --  1.6* 1.9 1.8 1.7 2.1  PHOS 2.0*  --  3.4 2.7 2.2* 2.7    GFR: Estimated Creatinine Clearance: 93 mL/min (by C-G formula based on SCr of 0.76 mg/dL).  Liver Function Tests: Recent Labs  Lab 04/21/24 0332 04/22/24 0329 04/23/24 0325 04/24/24 0451 04/25/24 0610  AST 82* 64* 45* 36 38  ALT 65* 54* 40 33 31  ALKPHOS 109 118 123 123 126  BILITOT 3.1* 2.5* 2.1* 2.1* 1.9*  PROT 5.0* 4.9* 4.8* 4.7* 5.2*  ALBUMIN  2.4* 2.2* 2.0* 1.8* 1.8*   Recent Labs  Lab 04/20/24 0528  LIPASE 266*   No results for input(s): "AMMONIA" in the last 168 hours.  Coagulation Profile: No results for input(s): "INR", "PROTIME" in the last 168 hours.   Cardiac Enzymes: Recent Labs  Lab 04/26/24 0811  CKTOTAL 62    BNP (last 3 results) No results for input(s): "PROBNP" in the last 8760 hours.  Lipid Profile: No results for input(s): "CHOL", "HDL", "LDLCALC", "TRIG", "CHOLHDL", "LDLDIRECT" in the last 72 hours.  Thyroid Function Tests: No results for input(s): "TSH", "T4TOTAL", "FREET4", "T3FREE", "THYROIDAB" in the last 72 hours.  Anemia Panel: No results for input(s): "VITAMINB12", "FOLATE", "FERRITIN", "TIBC", "IRON", "RETICCTPCT" in the last 72 hours.  Urine analysis:    Component Value Date/Time   COLORURINE AMBER (A) 04/21/2024 1114   APPEARANCEUR CLOUDY (A) 04/21/2024 1114   LABSPEC 1.027 04/21/2024 1114   PHURINE  5.0 04/21/2024 1114   GLUCOSEU NEGATIVE 04/21/2024 1114   HGBUR MODERATE (A) 04/21/2024 1114   BILIRUBINUR NEGATIVE 04/21/2024 1114   KETONESUR 5 (A) 04/21/2024 1114   PROTEINUR 30 (A) 04/21/2024 1114   NITRITE NEGATIVE 04/21/2024 1114   LEUKOCYTESUR NEGATIVE 04/21/2024 1114    Sepsis Labs: Lactic Acid, Venous No results found for: "LATICACIDVEN"  MICROBIOLOGY: Recent Results (from the past 240 hours)  Gastrointestinal Panel by PCR , Stool     Status: None   Collection Time: 04/20/24  6:39 PM   Specimen: Stool  Result Value Ref Range Status   Campylobacter species NOT DETECTED NOT  DETECTED Final   Plesimonas shigelloides NOT DETECTED NOT DETECTED Final   Salmonella species NOT DETECTED NOT DETECTED Final   Yersinia enterocolitica NOT DETECTED NOT DETECTED Final   Vibrio species NOT DETECTED NOT DETECTED Final   Vibrio cholerae NOT DETECTED NOT DETECTED Final   Enteroaggregative E coli (EAEC) NOT DETECTED NOT DETECTED Final   Enteropathogenic E coli (EPEC) NOT DETECTED NOT DETECTED Final   Enterotoxigenic E coli (ETEC) NOT DETECTED NOT DETECTED Final   Shiga like toxin producing E coli (STEC) NOT DETECTED NOT DETECTED Final   Shigella/Enteroinvasive E coli (EIEC) NOT DETECTED NOT DETECTED Final   Cryptosporidium NOT DETECTED NOT DETECTED Final   Cyclospora cayetanensis NOT DETECTED NOT DETECTED Final   Entamoeba histolytica NOT DETECTED NOT DETECTED Final   Giardia lamblia NOT DETECTED NOT DETECTED Final   Adenovirus F40/41 NOT DETECTED NOT DETECTED Final   Astrovirus NOT DETECTED NOT DETECTED Final   Norovirus GI/GII NOT DETECTED NOT DETECTED Final   Rotavirus A NOT DETECTED NOT DETECTED Final   Sapovirus (I, II, IV, and V) NOT DETECTED NOT DETECTED Final    Comment: Performed at [facility], [address] [place], [place] [address]  Culture, blood (Routine X 2) w Reflex to ID Panel     Status: Abnormal   Collection Time: 04/21/24 12:02 PM   Specimen: BLOOD  LEFT HAND  Result Value Ref Range Status   Specimen Description BLOOD LEFT HAND  Final   Special Requests   Final    BOTTLES DRAWN AEROBIC AND ANAEROBIC Blood Culture adequate volume   Culture  Setup Time   Final    GRAM NEGATIVE RODS GRAM POSITIVE COCCI ANAEROBIC BOTTLE ONLY GRAM NEGATIVE RODS AEROBIC BOTTLE ONLY CRITICAL RESULT CALLED TO, READ BACK BY AND VERIFIED WITH: J [facility]  04/22/24 MK    Culture (A)  Final    ESCHERICHIA COLI STAPHYLOCOCCUS EPIDERMIDIS THE SIGNIFICANCE OF ISOLATING THIS ORGANISM FROM A SINGLE SET OF BLOOD CULTURES WHEN MULTIPLE SETS ARE DRAWN IS UNCERTAIN. PLEASE NOTIFY THE MICROBIOLOGY DEPARTMENT WITHIN ONE WEEK IF SPECIATION AND SENSITIVITIES ARE REQUIRED. Performed at [facility], [address] [place], [place] [address]    Report Status 04/25/2024 FINAL  Final   Organism ID, Bacteria ESCHERICHIA COLI  Final   Organism ID, Bacteria ESCHERICHIA COLI  Final      Susceptibility   Escherichia coli - KIRBY BAUER*    CEFAZOLIN  SENSITIVE Sensitive    Escherichia coli - MIC*    AMPICILLIN <=2 SENSITIVE Sensitive     CEFEPIME  <=0.12 SENSITIVE Sensitive     CEFTAZIDIME <=1 SENSITIVE Sensitive     CEFTRIAXONE  <=0.25 SENSITIVE Sensitive     CIPROFLOXACIN <=0.25 SENSITIVE Sensitive     GENTAMICIN <=1 SENSITIVE Sensitive     IMIPENEM <=0.25 SENSITIVE Sensitive     TRIMETH /SULFA  <=20 SENSITIVE Sensitive     AMPICILLIN/SULBACTAM <=2 SENSITIVE Sensitive     PIP/TAZO <=4 SENSITIVE Sensitive ug/mL    * ESCHERICHIA COLI    ESCHERICHIA COLI  Culture, blood (Routine X 2) w Reflex to ID Panel     Status: Abnormal   Collection Time: 04/21/24 12:02 PM   Specimen: BLOOD RIGHT ARM  Result Value Ref Range Status   Specimen Description BLOOD RIGHT ARM  Final   Special Requests   Final    BOTTLES DRAWN AEROBIC ONLY Blood Culture adequate volume   Culture  Setup Time   Final    GRAM NEGATIVE RODS GRAM POSITIVE COCCI AEROBIC BOTTLE ONLY Performed  at  [facility], [address], [place], [place] [address]    Culture (A)  Final    MORGANELLA MORGANII ESCHERICHIA COLI SUSCEPTIBILITIES PERFORMED ON PREVIOUS CULTURE WITHIN THE LAST 5 DAYS. ENTEROCOCCUS AVIUM    Report Status 04/26/2024 FINAL  Final   Organism ID, Bacteria MORGANELLA MORGANII  Final      Susceptibility   Morganella morganii - MIC*    AMPICILLIN >=32 RESISTANT Resistant     CEFTAZIDIME <=1 SENSITIVE Sensitive     CIPROFLOXACIN <=0.25 SENSITIVE Sensitive     GENTAMICIN <=1 SENSITIVE Sensitive     IMIPENEM 1 SENSITIVE Sensitive     TRIMETH /SULFA  <=20 SENSITIVE Sensitive     AMPICILLIN/SULBACTAM >=32 RESISTANT Resistant     PIP/TAZO <=4 SENSITIVE Sensitive ug/mL    * MORGANELLA MORGANII  Blood Culture ID Panel (Reflexed)     Status: Abnormal   Collection Time: 04/21/24 12:02 PM  Result Value Ref Range Status   Enterococcus faecalis NOT DETECTED NOT DETECTED Final   Enterococcus Faecium NOT DETECTED NOT DETECTED Final   Listeria monocytogenes NOT DETECTED NOT DETECTED Final   Staphylococcus species DETECTED (A) NOT DETECTED Final    Comment: CRITICAL RESULT CALLED TO, READ BACK BY AND VERIFIED WITH: J WYLAND,PHARMD@0319  04/22/24 MK    Staphylococcus aureus (BCID) NOT DETECTED NOT DETECTED Final   Staphylococcus epidermidis DETECTED (A) NOT DETECTED Final    Comment: CRITICAL RESULT CALLED TO, READ BACK BY AND VERIFIED WITH: J WYLAND,PHARMD@0319  04/22/24 MK    Staphylococcus lugdunensis NOT DETECTED NOT DETECTED Final   Streptococcus species NOT DETECTED NOT DETECTED Final   Streptococcus agalactiae NOT DETECTED NOT DETECTED Final   Streptococcus pneumoniae NOT DETECTED NOT DETECTED Final   Streptococcus pyogenes NOT DETECTED NOT DETECTED Final   A.calcoaceticus-baumannii NOT DETECTED NOT DETECTED Final   Bacteroides fragilis NOT DETECTED NOT DETECTED Final   Enterobacterales DETECTED (A) NOT DETECTED Final    Comment: Enterobacterales represent a large  order of gram negative bacteria, not a single organism. CRITICAL RESULT CALLED TO, READ BACK BY AND VERIFIED WITH: J WYLAND,PHARMD@0319  04/22/24 MK    Enterobacter cloacae complex NOT DETECTED NOT DETECTED Final   Escherichia coli DETECTED (A) NOT DETECTED Final    Comment: CRITICAL RESULT CALLED TO, READ BACK BY AND VERIFIED WITH: J WYLAND,PHARMD@0319  04/22/24 MK    Klebsiella aerogenes NOT DETECTED NOT DETECTED Final   Klebsiella oxytoca NOT DETECTED NOT DETECTED Final   Klebsiella pneumoniae NOT DETECTED NOT DETECTED Final   Proteus species NOT DETECTED NOT DETECTED Final   Salmonella species NOT DETECTED NOT DETECTED Final   Serratia marcescens NOT DETECTED NOT DETECTED Final   Haemophilus influenzae NOT DETECTED NOT DETECTED Final   Neisseria meningitidis NOT DETECTED NOT DETECTED Final   Pseudomonas aeruginosa NOT DETECTED NOT DETECTED Final   Stenotrophomonas maltophilia NOT DETECTED NOT DETECTED Final   Candida albicans NOT DETECTED NOT DETECTED Final   Candida auris NOT DETECTED NOT DETECTED Final   Candida glabrata NOT DETECTED NOT DETECTED Final   Candida krusei NOT DETECTED NOT DETECTED Final   Candida parapsilosis NOT DETECTED NOT DETECTED Final   Candida tropicalis NOT DETECTED NOT DETECTED Final   Cryptococcus neoformans/gattii NOT DETECTED NOT DETECTED Final   CTX-M ESBL NOT DETECTED NOT DETECTED Final   Carbapenem resistance IMP NOT DETECTED NOT DETECTED Final   Carbapenem resistance KPC NOT DETECTED NOT DETECTED Final   Methicillin resistance mecA/C NOT DETECTED NOT DETECTED Final   Carbapenem resistance NDM NOT DETECTED NOT DETECTED Final  Carbapenem resist OXA 48 LIKE NOT DETECTED NOT DETECTED Final   Carbapenem resistance VIM NOT DETECTED NOT DETECTED Final    Comment: Performed at [facility], [address] [place], [place] [address]  Culture, blood (Routine X 2) w Reflex to ID Panel     Status: None (Preliminary result)   Collection Time: 04/23/24   7:55 AM   Specimen: BLOOD RIGHT ARM  Result Value Ref Range Status   Specimen Description BLOOD RIGHT ARM  Final   Special Requests   Final    BOTTLES DRAWN AEROBIC AND ANAEROBIC Blood Culture results may not be optimal due to an inadequate volume of blood received in culture bottles   Culture   Final    NO GROWTH 3 DAYS Performed at [facility], [address] [place], [place] [address]    Report Status PENDING  Incomplete  Culture, blood (Routine X 2) w Reflex to ID Panel     Status: None (Preliminary result)   Collection Time: 04/23/24  7:57 AM   Specimen: BLOOD LEFT HAND  Result Value Ref Range Status   Specimen Description BLOOD LEFT HAND  Final   Special Requests   Final    BOTTLES DRAWN AEROBIC AND ANAEROBIC Blood Culture adequate volume   Culture   Final    NO GROWTH 3 DAYS Performed at [facility], [address], [place], [place] [address]    Report Status PENDING  Incomplete    RADIOLOGY STUDIES/RESULTS: No results found.     LOS: 8 days   Kimberly Penna, MD  [facility]    To contact the attending provider between 7A-7P or the covering provider during after hours 7P-7A, please log into the web site [email or web address] and access using universal [place] password for that web site. If you do not have the password, please call the hospital operator.  04/26/2024, 11:03 AM

## 2024-04-27 DIAGNOSIS — K810 Acute cholecystitis: Secondary | ICD-10-CM

## 2024-04-27 LAB — COMPREHENSIVE METABOLIC PANEL WITH GFR
ALT: 32 U/L (ref 0–44)
ALT: 34 U/L (ref 0–44)
AST: 54 U/L — ABNORMAL HIGH (ref 15–41)
AST: 49 U/L — ABNORMAL HIGH (ref 15–41)
Albumin: 1.8 g/dL — ABNORMAL LOW (ref 3.5–5.0)
Albumin: 1.8 g/dL — ABNORMAL LOW (ref 3.5–5.0)
Alkaline Phosphatase: 108 U/L (ref 38–126)
Alkaline Phosphatase: 104 U/L (ref 38–126)
Anion gap: 8 (ref 5–15)
Anion gap: 8 (ref 5–15)
BUN: 12 mg/dL (ref 8–23)
BUN: 10 mg/dL (ref 8–23)
CO2: 23 mmol/L (ref 22–32)
CO2: 23 mmol/L (ref 22–32)
Calcium: 7.3 mg/dL — ABNORMAL LOW (ref 8.9–10.3)
Calcium: 7.4 mg/dL — ABNORMAL LOW (ref 8.9–10.3)
Chloride: 106 mmol/L (ref 98–111)
Chloride: 105 mmol/L (ref 98–111)
Creatinine, Ser: 0.75 mg/dL (ref 0.61–1.24)
Creatinine, Ser: 0.66 mg/dL (ref 0.61–1.24)
GFR, Estimated: >60 mL/min (ref >60)
GFR, Estimated: >60 mL/min (ref >60)
Glucose, Bld: 105 mg/dL — ABNORMAL HIGH (ref 70–99)
Glucose, Bld: 100 mg/dL — ABNORMAL HIGH (ref 70–99)
Potassium: 3.4 mmol/L — ABNORMAL LOW (ref 3.5–5.1)
Potassium: 3.5 mmol/L (ref 3.5–5.1)
Sodium: 137 mmol/L (ref 135–145)
Sodium: 136 mmol/L (ref 135–145)
Total Bilirubin: 1.2 mg/dL (ref 0.0–1.2)
Total Bilirubin: 1.5 mg/dL — ABNORMAL HIGH (ref 0.0–1.2)
Total Protein: 5 g/dL — ABNORMAL LOW (ref 6.5–8.1)
Total Protein: 5.3 g/dL — ABNORMAL LOW (ref 6.5–8.1)

## 2024-04-27 LAB — CBC WITH DIFFERENTIAL/PLATELET
Abs Immature Granulocytes: 0 10*3/uL (ref 0.00–0.07)
Basophils Absolute: 0 10*3/uL (ref 0.0–0.1)
Basophils Relative: 0 %
Eosinophils Absolute: 0 10*3/uL (ref 0.0–0.5)
Eosinophils Relative: 0 %
HCT: 25.1 % — ABNORMAL LOW (ref 39.0–52.0)
Hemoglobin: 8.4 g/dL — ABNORMAL LOW (ref 13.0–17.0)
Lymphocytes Relative: 11 %
Lymphs Abs: 1.5 10*3/uL (ref 0.7–4.0)
MCH: 33.1 pg (ref 26.0–34.0)
MCHC: 33.5 g/dL (ref 30.0–36.0)
MCV: 98.8 fL (ref 80.0–100.0)
Monocytes Absolute: 0.5 10*3/uL (ref 0.1–1.0)
Monocytes Relative: 4 %
Neutro Abs: 11.3 10*3/uL — ABNORMAL HIGH (ref 1.7–7.7)
Neutrophils Relative %: 85 %
Platelets: 386 10*3/uL (ref 150–400)
RBC: 2.54 MIL/uL — ABNORMAL LOW (ref 4.22–5.81)
RDW: 17.1 % — ABNORMAL HIGH (ref 11.5–15.5)
WBC: 13.3 10*3/uL — ABNORMAL HIGH (ref 4.0–10.5)
nRBC: 0.2 % (ref 0.0–0.2)
nRBC: 1 /100{WBCs} — ABNORMAL HIGH

## 2024-04-27 LAB — CBC
HCT: 20.8 % — ABNORMAL LOW (ref 39.0–52.0)
Hemoglobin: 6.8 g/dL — CL (ref 13.0–17.0)
MCH: 33.5 pg (ref 26.0–34.0)
MCHC: 32.7 g/dL (ref 30.0–36.0)
MCV: 102.5 fL — ABNORMAL HIGH (ref 80.0–100.0)
Platelets: 325 10*3/uL (ref 150–400)
RBC: 2.03 MIL/uL — ABNORMAL LOW (ref 4.22–5.81)
RDW: 15.3 % (ref 11.5–15.5)
WBC: 13.4 10*3/uL — ABNORMAL HIGH (ref 4.0–10.5)
nRBC: 0.1 % (ref 0.0–0.2)

## 2024-04-27 LAB — MAGNESIUM: Magnesium: 2 mg/dL (ref 1.7–2.4)

## 2024-04-27 LAB — PREPARE RBC (CROSSMATCH)

## 2024-04-27 LAB — PHOSPHORUS: Phosphorus: 2.1 mg/dL — ABNORMAL LOW (ref 2.5–4.6)

## 2024-04-27 MED ORDER — SODIUM CHLORIDE 0.9% IV SOLUTION: Freq: Once | INTRAVENOUS | Status: AC

## 2024-04-27 MED ORDER — POTASSIUM CHLORIDE CRYS ER 20 MEQ PO TBCR
40.0000 meq | EXTENDED_RELEASE_TABLET | Freq: Once | ORAL | Status: AC
  Administered 2024-04-27: 40 meq via ORAL
  Filled 2024-04-27: qty 2

## 2024-04-27 NOTE — Progress Notes (Signed)
 PROGRESS NOTE        PATIENT DETAILS Name: Tony Lopez Age: 73 y.o. Sex: male Date of Birth: 1951/11/08 Admit Date: 04/18/2024 Admitting Physician [name], MD WUJ:[id number], [name], [name]  Brief Summary: Patient is a 73 y.o.  male with history of HTN, prostate cancer, GERD, EtOH use-who was admitted for alcohol withdrawal with delirium tremens.  Hospital course complicated by polymicrobial bacteremia and acute calculus cholecystitis.    Significant events: 5/23>> admit to [facility]. 5/26>>Fever-blood culture- gm neg rod/gm +ve rod 5/27>> RUQ tender-[facility] consulted. 5/28>> HIDA scan +ve  Significant studies: 5/23>> CT head: No acute intracranial abnormality 5/23>> CT abdomen/pelvis: No acute abnormality. 5/24>> RUQ ultrasound: Biliary sludge/small gallstones.  No evidence of acute cholecystitis. 5/27>> RUQ ultrasound: Distended gallbladder with cholelithiasis/sludge. 5/28>> HIDA scan: Nonvisualization of the gallbladder  Significant microbiology data: 5/25>> GI pathogen panel: Negative 5/26>> blood culture: E. coli/Morganella/gram-positive cocci (BCID staph epi/E. coli-suspect staph epi is a contamination) 5/28>> blood cultures: No growth  Procedures: 5/30>> laparoscopic subtotal cholecystectomy with drain placement.  Consults: GI [facility] ID  Subjective:   Patient in bed, appears comfortable, denies any headache, no fever, no chest pain or pressure, no shortness of breath , no abdominal pain. No new focal weakness.  Objective: Vitals: Blood pressure (!) 126/50, pulse 91, temperature 97.9 F (36.6 C), temperature source Oral, resp. rate 16, height 6' (1.829 m), weight 88.3 kg, SpO2 96%.   Exam:  Awake Alert, No new F.N deficits, Normal affect [place].AT,PERRAL Supple Neck, No JVD,   Symmetrical Chest wall movement, Good air movement bilaterally, CTAB RRR,No Gallops, Rubs or new Murmurs,  +ve B.Sounds, Abd Soft, mild RUQ tenderness, gallbladder  drain in place No Cyanosis, Clubbing or edema    Assessment/Plan:  EtOH withdrawal with DTs Much improved after supportive care, DTs seem to have now resolved.  Counseled to abstain from alcohol use.  Sinus tachycardia with PACs Secondary to DTs Tachycardia has essentially resolved-with treatment of underlying DTs-and with initiation of low-dose beta-blockers.  Polymicrobial bacteremia (E. coli/Morganella/Enterococcus) E acute calculus cholecystitis S/p subtotal laparoscopic cholecystectomy 5/13 General Surgery and ID managing antibiotics, currently on Unasyn and Cipro combination TTE ordered 5/31 ID following -will await further recommendations. Thankfully repeat blood cultures negative so far.  AKI Hemodynamically mediated-poor oral intake/use of lisinopril /HCTZ Resolved with supportive care.  Hyponatremia Secondary to dehydration/poor oral intake/HCTZ use Resolved.  Hypomagnesemia/hypophosphatemia Replete/recheck.  Diarrhea Prn immodium GI pathogen panel negative Thankfully diarrhea has resolved.  Elevated lipase level Initially not felt to have abdominal pain-no evidence of pancreatitis on CT However on 5/27-RUQ pain-see above regarding plans for repeat RUQ ultrasound given gram-negative bacteremia.    EtOH hepatitis Transaminases have essentially normalized with supportive care Per GI note-no indication for steroid as discriminant function I<10.  Thrombocytopenia Secondary to EtOH use Resolved.  Normocytic anemia Probably related to EtOH related bone marrow suppression, trace Hemoccult positive stool, no signs of brisk bleeding, some drop due to heme dilution and perioperative blood loss.  Getting 1 unit of packed RBC on 04/27/2024.  On PPI continue to monitor.   Guaiac positive stool Outpatient GI follow-up-as no evidence of active bleeding.  PPI.  1.6 x 2.4 cm structure arising from left kidney Incidental finding on CT abdomen. Per radiology-favored to  represent proteinaceous/hemorrhagic cyst Will need outpatient imaging postdischarge.  Tobacco abuse Transdermal nicotine  Some wheezing today-longstanding history-unclear if  he has underlying COPD-starting bronchodilators and seeing how he does.  Debility/deconditioning Secondary to acute illness PT OT eval-SNF recommended  BMI: Estimated body mass index is 26.4 kg/m as calculated from the following:   Height as of this encounter: 6' (1.829 m).   Weight as of this encounter: 88.3 kg.   Code status:   Code Status: Full Code   DVT Prophylaxis: SCDs Start: 04/18/24 2024   Family Communication: Ex wife-Nikki Eschbach [phone] updated over the phone 5/31   Disposition Plan: Status is: Inpatient Remains inpatient appropriate because: Severity of illness   Planned Discharge Destination: SNF   Diet: Diet Order             Diet clear liquid Room service appropriate? Yes; Fluid consistency: Thin  Diet effective now                   MEDICATIONS: Scheduled Meds:  acetaminophen   500 mg Oral TID WC & HS   allopurinol   100 mg Oral Daily   feeding supplement  237 mL Oral BID BM   folic acid   1 mg Oral Daily   ipratropium  0.5 mg Nebulization BID   levalbuterol   0.63 mg Nebulization BID   lidocaine   1 patch Transdermal Q24H   metoprolol  tartrate  25 mg Oral BID   multivitamin with minerals  1 tablet Oral Daily   nicotine   21 mg Transdermal Daily   pantoprazole  (PROTONIX ) IV  40 mg Intravenous Q12H   polycarbophil  625 mg Oral BID   sodium chloride  flush  3 mL Intravenous Q12H   thiamine   100 mg Oral Daily   Or   thiamine   100 mg Intravenous Daily   Continuous Infusions:  sodium chloride      ampicillin-sulbactam (UNASYN) IV 3 g (04/27/24 0339)   ciprofloxacin 400 mg (04/27/24 0441)   lactated ringers      PRN Meds:.sodium chloride , acetaminophen , alum & mag hydroxide-simeth, bisacodyl, diphenhydrAMINE , HYDROmorphone  (DILAUDID ) injection, lactated ringers ,  loperamide , magic mouthwash, menthol-cetylpyridinium, methocarbamol (ROBAXIN) injection **OR** methocarbamol, naphazoline-glycerin, ondansetron  **OR** ondansetron  (ZOFRAN ) IV, oxyCODONE , phenol, prochlorperazine, simethicone, sodium chloride , sodium chloride  flush   I have personally reviewed following labs and imaging studies  LABORATORY DATA:  Data Review:   Inpatient Medications  Scheduled Meds:  acetaminophen   500 mg Oral TID WC & HS   allopurinol   100 mg Oral Daily   feeding supplement  237 mL Oral BID BM   folic acid   1 mg Oral Daily   ipratropium  0.5 mg Nebulization BID   levalbuterol   0.63 mg Nebulization BID   lidocaine   1 patch Transdermal Q24H   metoprolol  tartrate  25 mg Oral BID   multivitamin with minerals  1 tablet Oral Daily   nicotine   21 mg Transdermal Daily   pantoprazole  (PROTONIX ) IV  40 mg Intravenous Q12H   polycarbophil  625 mg Oral BID   sodium chloride  flush  3 mL Intravenous Q12H   thiamine   100 mg Oral Daily   Or   thiamine   100 mg Intravenous Daily   Continuous Infusions:  sodium chloride      ampicillin-sulbactam (UNASYN) IV 3 g (04/27/24 0339)   ciprofloxacin 400 mg (04/27/24 0441)   lactated ringers      PRN Meds:.sodium chloride , acetaminophen , alum & mag hydroxide-simeth, bisacodyl, diphenhydrAMINE , HYDROmorphone  (DILAUDID ) injection, lactated ringers , loperamide , magic mouthwash, menthol-cetylpyridinium, methocarbamol (ROBAXIN) injection **OR** methocarbamol, naphazoline-glycerin, ondansetron  **OR** ondansetron  (ZOFRAN ) IV, oxyCODONE , phenol, prochlorperazine, simethicone, sodium chloride , sodium chloride  flush  DVT Prophylaxis  SCDs  Start: 04/18/24 2024  Recent Labs  Lab 04/23/24 0325 04/24/24 0451 04/25/24 0610 04/26/24 0811 04/27/24 0307  WBC 11.2* 12.5* 16.7* 15.1* 13.4*  HGB 8.3* 7.5* 7.8* 7.0* 6.8*  HCT 24.3* 22.4* 22.8* 20.7* 20.8*  PLT 135* 136* 209 260 325  MCV 97.2 97.8 97.4 101.5* 102.5*  MCH 33.2 32.8 33.3 34.3* 33.5   MCHC 34.2 33.5 34.2 33.8 32.7  RDW 14.8 15.6* 15.5 15.4 15.3    Recent Labs  Lab 04/21/24 1343 04/22/24 0329 04/23/24 0325 04/24/24 0451 04/25/24 0610 04/26/24 0811 04/27/24 0307  NA 134* 135 135 139 136 139 137  K 3.7 3.2* 3.9 3.6 3.5 3.8 3.4*  CL 98 101 102 106 105 108 106  CO2 26 25 25 23 22 23 23   ANIONGAP 10 9 8 10 9 8 8   GLUCOSE 152* 114* 101* 99 107* 104* 105*  BUN 18 14 11 12 13 12 12   CREATININE 0.80 0.71 0.72 0.83 0.79 0.76 0.75  AST  --  64* 45* 36 38  --  54*  ALT  --  54* 40 33 31  --  32  ALKPHOS  --  118 123 123 126  --  108  BILITOT  --  2.5* 2.1* 2.1* 1.9*  --  1.2  ALBUMIN   --  2.2* 2.0* 1.8* 1.8*  --  1.8*  MG  --  1.6* 1.9 1.8 1.7 2.1  --   PHOS 2.0*  --  3.4 2.7 2.2* 2.7  --   CALCIUM 8.2* 7.8* 7.7* 7.7* 7.7* 7.5* 7.3*      Recent Labs  Lab 04/22/24 0329 04/23/24 0325 04/24/24 0451 04/25/24 0610 04/26/24 0811 04/27/24 0307  MG 1.6* 1.9 1.8 1.7 2.1  --   CALCIUM 7.8* 7.7* 7.7* 7.7* 7.5* 7.3*    Radiology Reports  No results found.    Signature  -   Lynnwood Sauer M.D on 04/27/2024 at 8:23 AM   -  To page go to [email or web address]

## 2024-04-27 NOTE — Progress Notes (Signed)
 04/27/2024  Tony Lopez [id number] January 15, 1951  CARE TEAM: PCP: Diamond Formica, PA  Outpatient Care Team: Patient Care Team: Redmon, Noelle, [name] as PCP - General (Nurse Practitioner)  Inpatient Treatment Team: Treatment Team:  Cala Castleman, MD Tyrone Gallop, MD C[facility], Md, MD Kalman Ores, NT Adaline Ada, [facility] Waunita Haff, RN Kathaleen Pale, RN Swist, Debbie, RN Duffy, Florene Huntington, LCSWA   Problem List:   Principal Problem:   Empyema of gallbladder Active Problems:   Prostatic adenocarcinoma (HCC)   Hepatitis C antibody positive in blood   Alcohol withdrawal (HCC)   Alcoholic hepatitis   Hyponatremia   AKI (acute kidney injury) (HCC)   GI bleed   Acute cholecystitis with chronic cholecystitis   04/25/2024  PRE-OPERATIVE DIAGNOSIS: Acute cholecystitis   POST-OPERATIVE DIAGNOSIS: Acute on chronic cholecystitis with hydrops gallbladder, empyema   PROCEDURE: Laparoscopic subtotal cholecystectomy   SURGEON: Beatris Lincoln, MD   FINDINGS: Acute on chronic cholecystitis with hydrops gallbladder.  Additionally, empyema.  Gallbladder still with clear fluid but additionally significant amounts of pus.  There is pus exuding from the wall of the gallbladder.  Significant inflammation of the pericholecystic tissues with an enlarged liver making cholecystectomy potentially dangerous.  For these reasons, we opted to proceed with a subtotal cholecystectomy.  Substantial portion of the gallbladder wall was removed.  Gallstones were from the gallbladder and a 19 French round Blake drain was placed at completion.      Assessment [facility] Stay = 9 days) 2 Days Post-Op    Delayed bile leak.   Assessment/Plan  E. Coli Bacteremia  Acute cholecystitis with empyema of gallbladder - HIDA positive for cholecystitis - Laparoscopic subtotal cholecystectomy with drain placement 5/23 Dr. Camilo Cella -No nausea or vomiting.  Try clear liquid diets advance to full's as  tolerated.  N.p.o. after midnight -Antibiotics x 5 days postop.  Switch to piperacillin/tazobactam. -Keep surgical drain for now.   - Drain output now frankly bilious consistent with delayed bile leak in the setting of subtotal cholecystectomy with bad empyema.  Unfortunate not surprising this high risk patient.  I reached out to Dr. Honey Lusty covering for [facility] (patient of Dr. Felecia Hopper) I think the patient would benefit from ERCP and stenting by one of his partners that has biliary/ERCP expertise.  Hopefully they can get set up on Monday tomorrow.  I do not see a good reason to proceed with HIDA scan or MRCP first but will defer to Gastroenterology if they insist  FEN: Clear liquid diet.  Hold off on advancing till ERCP/stenting done.  And then hopefully can advance gradually  VTE: SCDs ID: Rocephin  -5/31.  Zosyn 5/31- Anemia of chronic disease with acute postoperative blood loss anemia.  Being transfused.  Follow platelets.  Seem to be less thrombocytopenic right now.  Pro time/INR normal guardedly reassuring    - per [facility] -  Acute alcohol withdrawal with DTs AKI, resolved Alcoholic hepatitis Chronic thrombocytopenia - plts 135K Left renal lesion - outpatient follow up imaging recommended  Tobacco abuse  I updated the patient's status to the patient  Recommendations were made.  Questions were answered.  He expressed understanding & appreciation.  -Disposition: To be determined.      I reviewed nursing notes, hospitalist notes, last 24 h vitals and pain scores, last 48 h intake and output, last 24 h labs and trends, and last 24 h imaging results.  I have reviewed this patient's available data, including medical history, events of note,  test results, etc as part of my evaluation.   A significant portion of that time was spent in counseling. Care during the described time interval was provided by me.  This care required moderate level of medical decision making.   04/27/2024    Subjective: (Chief complaint)  Patient denies much pain or nausea.  Tolerating liquids without nausea or vomiting.  Drain output changing from serosanguineous to brown  Objective:  Vital signs:  Vitals:   04/27/24 0449 04/27/24 0708 04/27/24 0806 04/27/24 0822  BP: (!) 120/55 139/78 (!) 126/50   Pulse: 70 91    Resp: 17 (!) 21 16   Temp: 97.6 F (36.4 C) 97.9 F (36.6 C) 97.9 F (36.6 C)   TempSrc: Oral Axillary Oral   SpO2: 95%   96%  Weight:      Height:        Last BM Date : 04/21/24  Intake/Output   Yesterday:  05/31 0701 - 06/01 0700 In: 627 [P.O.:627] Out: 205 [Drains:205] This shift:  No intake/output data recorded.  Bowel function:  Flatus: YES  BM:  No  Drain: Bilious   Physical Exam:  General: Pt awake/alert in no acute distress.  Watching TV.  Calm and relaxed.  Not toxic nor sickly. Eyes: PERRL, normal EOM.  Sclera clear.  No icterus Neuro: CN II-XII intact w/o focal sensory/motor deficits. Lymph: No head/neck/groin lymphadenopathy Psych:  No delerium/psychosis/paranoia.  Oriented x 4 HENT: Normocephalic, Mucus membranes moist.  No thrush Neck: Supple, No tracheal deviation.  No obvious thyromegaly Chest: No pain to chest wall compression.  Good respiratory excursion.  No audible wheezing CV:  Pulses intact.  Regular rhythm.  No major extremity edema MS: Normal AROM mjr joints.  No obvious deformity  Abdomen: Soft.  Mildy distended.  Tenderness at right sided drain site only.  No evidence of peritonitis.  No incarcerated hernias.  Ext:   No deformity.  No mjr edema.  No cyanosis Skin: No petechiae / purpurea.  No major sores.  Warm and dry    Results:   Cultures: Recent Results (from the past 720 hours)  Gastrointestinal Panel by PCR , Stool     Status: None   Collection Time: 04/20/24  6:39 PM   Specimen: Stool  Result Value Ref Range Status   Campylobacter species NOT DETECTED NOT DETECTED Final   Plesimonas  shigelloides NOT DETECTED NOT DETECTED Final   Salmonella species NOT DETECTED NOT DETECTED Final   Yersinia enterocolitica NOT DETECTED NOT DETECTED Final   Vibrio species NOT DETECTED NOT DETECTED Final   Vibrio cholerae NOT DETECTED NOT DETECTED Final   Enteroaggregative E coli (EAEC) NOT DETECTED NOT DETECTED Final   Enteropathogenic E coli (EPEC) NOT DETECTED NOT DETECTED Final   Enterotoxigenic E coli (ETEC) NOT DETECTED NOT DETECTED Final   Shiga like toxin producing E coli (STEC) NOT DETECTED NOT DETECTED Final   Shigella/Enteroinvasive E coli (EIEC) NOT DETECTED NOT DETECTED Final   Cryptosporidium NOT DETECTED NOT DETECTED Final   Cyclospora cayetanensis NOT DETECTED NOT DETECTED Final   Entamoeba histolytica NOT DETECTED NOT DETECTED Final   Giardia lamblia NOT DETECTED NOT DETECTED Final   Adenovirus F40/41 NOT DETECTED NOT DETECTED Final   Astrovirus NOT DETECTED NOT DETECTED Final   Norovirus GI/GII NOT DETECTED NOT DETECTED Final   Rotavirus A NOT DETECTED NOT DETECTED Final   Sapovirus (I, II, IV, and V) NOT DETECTED NOT DETECTED Final    Comment: Performed at [facility], [address] [place], [place] [address]  Culture, blood (Routine X 2) w Reflex to ID Panel     Status: Abnormal   Collection Time: 04/21/24 12:02 PM   Specimen: BLOOD LEFT HAND  Result Value Ref Range Status   Specimen Description BLOOD LEFT HAND  Final   Special Requests   Final    BOTTLES DRAWN AEROBIC AND ANAEROBIC Blood Culture adequate volume   Culture  Setup Time   Final    GRAM NEGATIVE RODS GRAM POSITIVE COCCI ANAEROBIC BOTTLE ONLY GRAM NEGATIVE RODS AEROBIC BOTTLE ONLY CRITICAL RESULT CALLED TO, READ BACK BY AND VERIFIED WITH: J WYLAND,PHARMD@0320  04/22/24 MK    Culture (A)  Final    ESCHERICHIA COLI STAPHYLOCOCCUS EPIDERMIDIS THE SIGNIFICANCE OF ISOLATING THIS ORGANISM FROM A SINGLE SET OF BLOOD CULTURES WHEN MULTIPLE SETS ARE DRAWN IS UNCERTAIN. PLEASE NOTIFY THE  MICROBIOLOGY DEPARTMENT WITHIN ONE WEEK IF SPECIATION AND SENSITIVITIES ARE REQUIRED. Performed at [facility], [address], [place], [place] [address]    Report Status 04/25/2024 FINAL  Final   Organism ID, Bacteria ESCHERICHIA COLI  Final   Organism ID, Bacteria ESCHERICHIA COLI  Final      Susceptibility   Escherichia coli - KIRBY BAUER*    CEFAZOLIN  SENSITIVE Sensitive    Escherichia coli - MIC*    AMPICILLIN <=2 SENSITIVE Sensitive     CEFEPIME  <=0.12 SENSITIVE Sensitive     CEFTAZIDIME <=1 SENSITIVE Sensitive     CEFTRIAXONE  <=0.25 SENSITIVE Sensitive     CIPROFLOXACIN <=0.25 SENSITIVE Sensitive     GENTAMICIN <=1 SENSITIVE Sensitive     IMIPENEM <=0.25 SENSITIVE Sensitive     TRIMETH /SULFA  <=20 SENSITIVE Sensitive     AMPICILLIN/SULBACTAM <=2 SENSITIVE Sensitive     PIP/TAZO <=4 SENSITIVE Sensitive ug/mL    * ESCHERICHIA COLI    ESCHERICHIA COLI  Culture, blood (Routine X 2) w Reflex to ID Panel     Status: Abnormal   Collection Time: 04/21/24 12:02 PM   Specimen: BLOOD RIGHT ARM  Result Value Ref Range Status   Specimen Description BLOOD RIGHT ARM  Final   Special Requests   Final    BOTTLES DRAWN AEROBIC ONLY Blood Culture adequate volume   Culture  Setup Time   Final    GRAM NEGATIVE RODS GRAM POSITIVE COCCI AEROBIC BOTTLE ONLY Performed at [facility], [address], [place], [place] [address]    Culture (A)  Final    MORGANELLA MORGANII ESCHERICHIA COLI SUSCEPTIBILITIES PERFORMED ON PREVIOUS CULTURE WITHIN THE LAST 5 DAYS. ENTEROCOCCUS AVIUM    Report Status 04/26/2024 FINAL  Final   Organism ID, Bacteria MORGANELLA MORGANII  Final   Organism ID, Bacteria ENTEROCOCCUS AVIUM  Final      Susceptibility   Enterococcus avium - MIC*    AMPICILLIN <=2 SENSITIVE Sensitive     VANCOMYCIN <=0.5 SENSITIVE Sensitive     GENTAMICIN SYNERGY SENSITIVE Sensitive     * ENTEROCOCCUS AVIUM   Morganella morganii - MIC*    AMPICILLIN >=32 RESISTANT  Resistant     CEFTAZIDIME <=1 SENSITIVE Sensitive     CIPROFLOXACIN <=0.25 SENSITIVE Sensitive     GENTAMICIN <=1 SENSITIVE Sensitive     IMIPENEM 1 SENSITIVE Sensitive     TRIMETH /SULFA  <=20 SENSITIVE Sensitive     AMPICILLIN/SULBACTAM >=32 RESISTANT Resistant     PIP/TAZO <=4 SENSITIVE Sensitive ug/mL    * MORGANELLA MORGANII  Blood Culture ID Panel (Reflexed)     Status: Abnormal  Collection Time: 04/21/24 12:02 PM  Result Value Ref Range Status   Enterococcus faecalis NOT DETECTED NOT DETECTED Final   Enterococcus Faecium NOT DETECTED NOT DETECTED Final   Listeria monocytogenes NOT DETECTED NOT DETECTED Final   Staphylococcus species DETECTED (A) NOT DETECTED Final    Comment: CRITICAL RESULT CALLED TO, READ BACK BY AND VERIFIED WITH: J WYLAND,PHARMD@0319  04/22/24 MK    Staphylococcus aureus (BCID) NOT DETECTED NOT DETECTED Final   Staphylococcus epidermidis DETECTED (A) NOT DETECTED Final    Comment: CRITICAL RESULT CALLED TO, READ BACK BY AND VERIFIED WITH: J WYLAND,PHARMD@0319  04/22/24 MK    Staphylococcus lugdunensis NOT DETECTED NOT DETECTED Final   Streptococcus species NOT DETECTED NOT DETECTED Final   Streptococcus agalactiae NOT DETECTED NOT DETECTED Final   Streptococcus pneumoniae NOT DETECTED NOT DETECTED Final   Streptococcus pyogenes NOT DETECTED NOT DETECTED Final   A.calcoaceticus-baumannii NOT DETECTED NOT DETECTED Final   Bacteroides fragilis NOT DETECTED NOT DETECTED Final   Enterobacterales DETECTED (A) NOT DETECTED Final    Comment: Enterobacterales represent a large order of gram negative bacteria, not a single organism. CRITICAL RESULT CALLED TO, READ BACK BY AND VERIFIED WITH: J WYLAND,PHARMD@0319  04/22/24 MK    Enterobacter cloacae complex NOT DETECTED NOT DETECTED Final   Escherichia coli DETECTED (A) NOT DETECTED Final    Comment: CRITICAL RESULT CALLED TO, READ BACK BY AND VERIFIED WITH: J WYLAND,PHARMD@0319  04/22/24 MK    Klebsiella aerogenes  NOT DETECTED NOT DETECTED Final   Klebsiella oxytoca NOT DETECTED NOT DETECTED Final   Klebsiella pneumoniae NOT DETECTED NOT DETECTED Final   Proteus species NOT DETECTED NOT DETECTED Final   Salmonella species NOT DETECTED NOT DETECTED Final   Serratia marcescens NOT DETECTED NOT DETECTED Final   Haemophilus influenzae NOT DETECTED NOT DETECTED Final   Neisseria meningitidis NOT DETECTED NOT DETECTED Final   Pseudomonas aeruginosa NOT DETECTED NOT DETECTED Final   Stenotrophomonas maltophilia NOT DETECTED NOT DETECTED Final   Candida albicans NOT DETECTED NOT DETECTED Final   Candida auris NOT DETECTED NOT DETECTED Final   Candida glabrata NOT DETECTED NOT DETECTED Final   Candida krusei NOT DETECTED NOT DETECTED Final   Candida parapsilosis NOT DETECTED NOT DETECTED Final   Candida tropicalis NOT DETECTED NOT DETECTED Final   Cryptococcus neoformans/gattii NOT DETECTED NOT DETECTED Final   CTX-M ESBL NOT DETECTED NOT DETECTED Final   Carbapenem resistance IMP NOT DETECTED NOT DETECTED Final   Carbapenem resistance KPC NOT DETECTED NOT DETECTED Final   Methicillin resistance mecA/C NOT DETECTED NOT DETECTED Final   Carbapenem resistance NDM NOT DETECTED NOT DETECTED Final   Carbapenem resist OXA 48 LIKE NOT DETECTED NOT DETECTED Final   Carbapenem resistance VIM NOT DETECTED NOT DETECTED Final    Comment: Performed at [facility], [address], [place], [place] [address]  Culture, blood (Routine X 2) w Reflex to ID Panel     Status: None (Preliminary result)   Collection Time: 04/23/24  7:55 AM   Specimen: BLOOD RIGHT ARM  Result Value Ref Range Status   Specimen Description BLOOD RIGHT ARM  Final   Special Requests   Final    BOTTLES DRAWN AEROBIC AND ANAEROBIC Blood Culture results may not be optimal due to an inadequate volume of blood received in culture bottles   Culture   Final    NO GROWTH 4 DAYS Performed at [facility], [address] [place], [place]  [address]    Report Status PENDING  Incomplete  Culture, blood (Routine X 2) w Reflex to ID Panel     Status: None (Preliminary result)   Collection Time: 04/23/24  7:57 AM   Specimen: BLOOD LEFT HAND  Result Value Ref Range Status   Specimen Description BLOOD LEFT HAND  Final   Special Requests   Final    BOTTLES DRAWN AEROBIC AND ANAEROBIC Blood Culture adequate volume   Culture   Final    NO GROWTH 4 DAYS Performed at [facility], [address] [place], [place] [address]    Report Status PENDING  Incomplete    Labs: Results for orders placed or performed during the hospital encounter of 04/18/24 (from the past 48 hours)  Glucose, capillary     Status: Abnormal   Collection Time: 04/25/24 11:20 AM  Result Value Ref Range   Glucose-Capillary 112 (H) 70 - 99 mg/dL    Comment: Glucose reference range applies only to samples taken after fasting for at least 8 hours.  Type and screen [facility]     Status: None (Preliminary result)   Collection Time: 04/25/24  1:07 PM  Result Value Ref Range   ABO/RH(D) A POS    Antibody Screen NEG    Sample Expiration 04/28/2024,2359    Unit Number [id number]    Blood Component Type RED CELLS,LR    Unit division 00    Status of Unit ISSUED    Transfusion Status OK TO TRANSFUSE    Crossmatch Result      Compatible Performed at [facility], [address] [place], [place] [address]   Basic metabolic panel with GFR     Status: Abnormal   Collection Time: 04/26/24  8:11 AM  Result Value Ref Range   Sodium 139 135 - 145 mmol/L   Potassium 3.8 3.5 - 5.1 mmol/L   Chloride 108 98 - 111 mmol/L   CO2 23 22 - 32 mmol/L   Glucose, Bld 104 (H) 70 - 99 mg/dL    Comment: Glucose reference range applies only to samples taken after fasting for at least 8 hours.   BUN 12 8 - 23 mg/dL   Creatinine, Ser 9.52 0.61 - 1.24 mg/dL   Calcium 7.5 (L) 8.9 - 10.3 mg/dL   GFR, Estimated >84 >13 mL/min    Comment:  (NOTE) Calculated using the CKD-EPI Creatinine Equation (2021)    Anion gap 8 5 - 15    Comment: Performed at [facility], [address] [place], [place] [address]  CBC     Status: Abnormal   Collection Time: 04/26/24  8:11 AM  Result Value Ref Range   WBC 15.1 (H) 4.0 - 10.5 K/uL   RBC 2.04 (L) 4.22 - 5.81 MIL/uL   Hemoglobin 7.0 (L) 13.0 - 17.0 g/dL    Comment: REPEATED TO VERIFY   HCT 20.7 (L) 39.0 - 52.0 %   MCV 101.5 (H) 80.0 - 100.0 fL   MCH 34.3 (H) 26.0 - 34.0 pg   MCHC 33.8 30.0 - 36.0 g/dL   RDW 02.7 25.3 - 66.4 %   Platelets 260 150 - 400 K/uL   nRBC 0.0 0.0 - 0.2 %    Comment: Performed at [facility], [address] [place], [place] [address]  Phosphorus     Status: None   Collection Time: 04/26/24  8:11 AM  Result Value Ref Range   Phosphorus 2.7 2.5 - 4.6 mg/dL    Comment:  Performed at [facility], [address], [place], [place] [address]  Magnesium      Status: None   Collection Time: 04/26/24  8:11 AM  Result Value Ref Range   Magnesium  2.1 1.7 - 2.4 mg/dL    Comment: Performed at [facility], [address], [place], [place] [address]  CK     Status: None   Collection Time: 04/26/24  8:11 AM  Result Value Ref Range   Total CK 62 49 - 397 U/L    Comment: Performed at [facility], [address] [place], [place] [address]  CBC     Status: Abnormal   Collection Time: 04/27/24  3:07 AM  Result Value Ref Range   WBC 13.4 (H) 4.0 - 10.5 K/uL   RBC 2.03 (L) 4.22 - 5.81 MIL/uL   Hemoglobin 6.8 (LL) 13.0 - 17.0 g/dL    Comment: REPEATED TO VERIFY THIS CRITICAL RESULT HAS VERIFIED AND BEEN CALLED TO K KARKI RN BY ASHLEY CHRISTENBURY ON 06 01 2025 AT 0328, AND HAS BEEN READ BACK.     HCT 20.8 (L) 39.0 - 52.0 %   MCV 102.5 (H) 80.0 - 100.0 fL   MCH 33.5 26.0 - 34.0 pg   MCHC 32.7 30.0 - 36.0 g/dL   RDW 95.6 21.3 - 08.6 %   Platelets 325 150 - 400 K/uL   nRBC 0.1 0.0 - 0.2 %    Comment: Performed at [facility], [address], [place], [place] [address]  Comprehensive metabolic panel with GFR     Status: Abnormal   Collection Time: 04/27/24  3:07 AM  Result Value Ref Range   Sodium 137 135 - 145 mmol/L   Potassium 3.4 (L) 3.5 - 5.1 mmol/L   Chloride 106 98 - 111 mmol/L   CO2 23 22 - 32 mmol/L   Glucose, Bld 105 (H) 70 - 99 mg/dL    Comment: Glucose reference range applies only to samples taken after fasting for at least 8 hours.   BUN 12 8 - 23 mg/dL   Creatinine, Ser 9.62 0.61 - 1.24 mg/dL   Calcium 7.3 (L) 8.9 - 10.3 mg/dL   Total Protein 5.0 (L) 6.5 - 8.1 g/dL   Albumin  1.8 (L) 3.5 - 5.0 g/dL   AST 54 (H) 15 - 41 U/L   ALT 32 0 - 44 U/L   Alkaline Phosphatase 108 38 - 126 U/L   Total Bilirubin 1.2 0.0 - 1.2 mg/dL   GFR, Estimated >95 >28 mL/min    Comment: (NOTE) Calculated using the CKD-EPI Creatinine Equation (2021)    Anion gap 8 5 - 15    Comment: Performed at [facility], [address] [place], [place] [address]  Prepare RBC (crossmatch)     Status: None   Collection Time: 04/27/24  3:49 AM  Result Value Ref Range   Order Confirmation      ORDER PROCESSED BY BLOOD BANK Performed at [facility], [address], [place], [place] [address]     Imaging / Studies: No results found.  Medications / Allergies: per chart  Antibiotics: Anti-infectives (From admission, onward)    Start     Dose/Rate Route Frequency Ordered Stop   04/26/24 1600  Ampicillin-Sulbactam (UNASYN) 3 g in sodium chloride  0.9 % 100 mL IVPB        3 g 200 mL/hr over 30 Minutes Intravenous Every 6 hours 04/26/24 1506  04/26/24 1545  ciprofloxacin (CIPRO) IVPB 400 mg        400 mg 200 mL/hr over 60 Minutes Intravenous Every 12 hours 04/26/24 1456     04/26/24 1400  DAPTOmycin  (CUBICIN ) IVPB 700 mg/100mL premix  Status:  Discontinued        8 mg/kg  88.3 kg 200 mL/hr over 30 Minutes Intravenous Daily 04/25/24 1552 04/26/24 1456   04/26/24 1200  piperacillin-tazobactam (ZOSYN) IVPB  3.375 g  Status:  Discontinued        3.375 g 12.5 mL/hr over 240 Minutes Intravenous Every 8 hours 04/26/24 0941 04/26/24 1456   04/25/24 1345  DAPTOmycin  (CUBICIN ) IVPB 700 mg/197mL premix        8 mg/kg  88.3 kg 200 mL/hr over 30 Minutes Intravenous STAT 04/25/24 1332 04/25/24 1419   04/23/24 1430  ceFEPIme  (MAXIPIME ) 2 g in sodium chloride  0.9 % 100 mL IVPB  Status:  Discontinued        2 g 200 mL/hr over 30 Minutes Intravenous Every 8 hours 04/23/24 1337 04/26/24 0941   04/22/24 0400  cefTRIAXone  (ROCEPHIN ) 2 g in sodium chloride  0.9 % 100 mL IVPB  Status:  Discontinued        2 g 200 mL/hr over 30 Minutes Intravenous Every 24 hours 04/22/24 4098 04/23/24 1337         Note: Portions of this report may have been transcribed using voice recognition software. Every effort was made to ensure accuracy; however, inadvertent computerized transcription errors may be present.   Any transcriptional errors that result from this process are unintentional.    Eddye Goodie, MD, FACS, MASCRS Esophageal, Gastrointestinal & Colorectal Surgery Robotic and Minimally Invasive Surgery  [facility] [address], [address] [place], [place] [address] [phone] Fax [phone] Main  CONTACT INFORMATION: Weekday (9AM-5PM): Call [facility] main office at [phone] Weeknight (5PM-9AM) or Weekend/Holiday: Check EPIC "Web Links" tab & use "AMION" (password " [email or web address]") for General Surgery [facility] coverage  Please, DO NOT use SecureChat  (it is not reliable communication to reach operating surgeons & will lead to a delay in care).   Epic staff messaging available for outptient concerns needing 1-2 business day response.      04/27/2024  8:40 AM

## 2024-04-27 NOTE — Progress Notes (Signed)
 [name]  Tony Lopez 73 y.o. 10/18/1951   Subjective: Resting in bed. Denies abdominal pain.   Objective: Vital signs: Vitals:   04/27/24 0822 04/27/24 1200  BP:  101/64  Pulse:  71  Resp:  15  Temp:    SpO2: 96% (!) 88%  T 97.9 350 cc output from JP drain since midnight  Physical Exam: Gen: lethargic, disheveled, well-nourished, no acute distress  HEENT: anicteric sclera CV: RRR Chest: CTA B Abd: diffuse tenderness with guarding, mild distention, bilious fluid from RUQ JP drain Ext: no edema  Lab Results: Recent Labs    04/26/24 0811 04/27/24 0307 04/27/24 0817  NA 139 137 136  K 3.8 3.4* 3.5  CL 108 106 105  CO2 23 23 23   GLUCOSE 104* 105* 100*  BUN 12 12 10   CREATININE 0.76 0.75 0.66  CALCIUM 7.5* 7.3* 7.4*  MG 2.1  --  2.0  PHOS 2.7  --  2.1*   Recent Labs    04/27/24 0307 04/27/24 0817  AST 54* 49*  ALT 32 34  ALKPHOS 108 104  BILITOT 1.2 1.5*  PROT 5.0* 5.3*  ALBUMIN  1.8* 1.8*   Recent Labs    04/26/24 0811 04/27/24 0307  WBC 15.1* 13.4*  HGB 7.0* 6.8*  HCT 20.7* 20.8*  MCV 101.5* 102.5*  PLT 260 325      Assessment/Plan: Acute cholecystitis with hydrops gallbladder and empyema - s/p subtotal cholecystectomy (5/30) and RUQ drain with bilious output today (350 cc) concerning for bile leak and in need of ERCP with stent placement. Discussed risks/benefits of ERCP with patient and he agrees to proceed. ERCP scheduled for 730 am tomorrow with Dr. [name].  E. Coli Bacteremia on IV antibiotics. NPO p MN. Supportive care.   Tony Lopez 04/27/2024, 1:03 PM  Questions please call [phone] ID: Tony Lopez, male   DOB: 1951-04-29, 73 y.o.   MRN: [id number]

## 2024-04-27 NOTE — Progress Notes (Addendum)
 [facility] night cross cover note:   I was notified by the patient's RN of the patient's updated hemoglobin level this morning of 6.8, down slightly from yesterday morning's value of 7.0.  Not associate any reported new symptoms, and vital signs appear stable, including most recent blood pressure 117/75, with heart rates in the 70s.  He has an active type and screen in place.  I subsequently placed orders for transfusion of 1 unit PRBC over 3 hours, With order for repeat Hemoglobin level to Be checked following completion of transfusion of 1 unit PRBC.    Of note, the patient's RN conveys that the patient's drain output has changed in appearance overnight, changing from serosanguineous appearance last night to more of a greenish-yellow appearance now, in the absence of report of bloody appearance.     Camelia Cavalier, DO Hospitalist

## 2024-04-27 NOTE — H&P (View-Only) (Signed)
 [facility] Progress Note  Tony Lopez 73 y.o. 10/18/1951   Subjective: Resting in bed. Denies abdominal pain.   Objective: Vital signs: Vitals:   04/27/24 0822 04/27/24 1200  BP:  101/64  Pulse:  71  Resp:  15  Temp:    SpO2: 96% (!) 88%  T 97.9 350 cc output from JP drain since midnight  Physical Exam: Gen: lethargic, disheveled, well-nourished, no acute distress  HEENT: anicteric sclera CV: RRR Chest: CTA B Abd: diffuse tenderness with guarding, mild distention, bilious fluid from RUQ JP drain Ext: no edema  Lab Results: Recent Labs    04/26/24 0811 04/27/24 0307 04/27/24 0817  NA 139 137 136  K 3.8 3.4* 3.5  CL 108 106 105  CO2 23 23 23   GLUCOSE 104* 105* 100*  BUN 12 12 10   CREATININE 0.76 0.75 0.66  CALCIUM 7.5* 7.3* 7.4*  MG 2.1  --  2.0  PHOS 2.7  --  2.1*   Recent Labs    04/27/24 0307 04/27/24 0817  AST 54* 49*  ALT 32 34  ALKPHOS 108 104  BILITOT 1.2 1.5*  PROT 5.0* 5.3*  ALBUMIN  1.8* 1.8*   Recent Labs    04/26/24 0811 04/27/24 0307  WBC 15.1* 13.4*  HGB 7.0* 6.8*  HCT 20.7* 20.8*  MCV 101.5* 102.5*  PLT 260 325      Assessment/Plan: Acute cholecystitis with hydrops gallbladder and empyema - s/p subtotal cholecystectomy (5/30) and RUQ drain with bilious output today (350 cc) concerning for bile leak and in need of ERCP with stent placement. Discussed risks/benefits of ERCP with patient and he agrees to proceed. ERCP scheduled for 730 am tomorrow with Dr. [name].  E. Coli Bacteremia on IV antibiotics. NPO p MN. Supportive care.   Tony Lopez 04/27/2024, 1:03 PM  Questions please call [phone] ID: Tony Lopez, male   DOB: 1951-04-29, 73 y.o.   MRN: [id number]

## 2024-04-27 NOTE — Anesthesia Postprocedure Evaluation (Signed)
 Anesthesia Post Note  Patient: Tony Lopez  Procedure(s) Performed: SUBTOTAL CHOLECYSTECTOMY     Patient location during evaluation: PACU Anesthesia Type: General Level of consciousness: patient cooperative Pain management: pain level controlled Vital Signs Assessment: post-procedure vital signs reviewed and stable Respiratory status: spontaneous breathing, nonlabored ventilation and respiratory function stable Cardiovascular status: blood pressure returned to baseline and stable Postop Assessment: no apparent nausea or vomiting Anesthetic complications: no   No notable events documented.               Maisha Bogen

## 2024-04-28 ENCOUNTER — Inpatient Hospital Stay (HOSPITAL_COMMUNITY): Admitting: Anesthesiology

## 2024-04-28 ENCOUNTER — Inpatient Hospital Stay (HOSPITAL_COMMUNITY)

## 2024-04-28 ENCOUNTER — Encounter (HOSPITAL_COMMUNITY): Payer: Self-pay | Admitting: Surgery

## 2024-04-28 ENCOUNTER — Encounter (HOSPITAL_COMMUNITY)
Admission: EM | Disposition: A | Discharge status: Skilled Nursing Facility | Payer: Self-pay | Source: Home / Self Care | Attending: Internal Medicine

## 2024-04-28 DIAGNOSIS — R7881 Bacteremia: Secondary | ICD-10-CM

## 2024-04-28 DIAGNOSIS — K8000 Calculus of gallbladder with acute cholecystitis without obstruction: Secondary | ICD-10-CM | POA: Diagnosis not present

## 2024-04-28 DIAGNOSIS — K839 Disease of biliary tract, unspecified: Secondary | ICD-10-CM

## 2024-04-28 DIAGNOSIS — I10 Essential (primary) hypertension: Secondary | ICD-10-CM | POA: Diagnosis not present

## 2024-04-28 DIAGNOSIS — K810 Acute cholecystitis: Secondary | ICD-10-CM | POA: Diagnosis not present

## 2024-04-28 DIAGNOSIS — F17210 Nicotine dependence, cigarettes, uncomplicated: Secondary | ICD-10-CM | POA: Diagnosis not present

## 2024-04-28 HISTORY — PX: ERCP: SHX5425

## 2024-04-28 HISTORY — PX: STONE EXTRACTION WITH BASKET: SHX5318

## 2024-04-28 HISTORY — PX: BILIARY STENT PLACEMENT: SHX5538

## 2024-04-28 HISTORY — PX: SPHINCTEROTOMY: SHX5279

## 2024-04-28 LAB — ECHOCARDIOGRAM COMPLETE
Area-P 1/2: 4.06 cm2
Height: 72 in
S' Lateral: 3.1 cm
Weight: 3114.66 [oz_av]

## 2024-04-28 LAB — PROTIME-INR
INR: 1.1 (ref 0.8–1.2)
Prothrombin Time: 14 s (ref 11.4–15.2)

## 2024-04-28 LAB — CBC WITH DIFFERENTIAL/PLATELET
Abs Immature Granulocytes: 0 10*3/uL (ref 0.00–0.07)
Basophils Absolute: 0 10*3/uL (ref 0.0–0.1)
Basophils Relative: 0 %
Eosinophils Absolute: 0 10*3/uL (ref 0.0–0.5)
Eosinophils Relative: 0 %
HCT: 23.5 % — ABNORMAL LOW (ref 39.0–52.0)
Hemoglobin: 7.8 g/dL — ABNORMAL LOW (ref 13.0–17.0)
Lymphocytes Relative: 12 %
Lymphs Abs: 1.2 10*3/uL (ref 0.7–4.0)
MCH: 32.2 pg (ref 26.0–34.0)
MCHC: 33.2 g/dL (ref 30.0–36.0)
MCV: 97.1 fL (ref 80.0–100.0)
Monocytes Absolute: 0.3 10*3/uL (ref 0.1–1.0)
Monocytes Relative: 3 %
Neutro Abs: 8.3 10*3/uL — ABNORMAL HIGH (ref 1.7–7.7)
Neutrophils Relative %: 85 %
Platelets: 414 10*3/uL — ABNORMAL HIGH (ref 150–400)
RBC: 2.42 MIL/uL — ABNORMAL LOW (ref 4.22–5.81)
RDW: 16.9 % — ABNORMAL HIGH (ref 11.5–15.5)
WBC: 9.8 10*3/uL (ref 4.0–10.5)
nRBC: 0 % (ref 0.0–0.2)
nRBC: 0 /100{WBCs}

## 2024-04-28 LAB — CULTURE, BLOOD (ROUTINE X 2)
Culture: NO GROWTH
Culture: NO GROWTH
Special Requests: ADEQUATE

## 2024-04-28 LAB — TYPE AND SCREEN
ABO/RH(D): A POS
Antibody Screen: NEGATIVE
Unit division: 0

## 2024-04-28 LAB — SURGICAL PATHOLOGY

## 2024-04-28 LAB — BPAM RBC
Blood Product Expiration Date: 202506242359
ISSUE DATE / TIME: 202506010424
Unit Type and Rh: 6200

## 2024-04-28 LAB — COMPREHENSIVE METABOLIC PANEL WITH GFR
ALT: 30 U/L (ref 0–44)
AST: 39 U/L (ref 15–41)
Albumin: 1.8 g/dL — ABNORMAL LOW (ref 3.5–5.0)
Alkaline Phosphatase: 112 U/L (ref 38–126)
Anion gap: 6 (ref 5–15)
BUN: 8 mg/dL (ref 8–23)
CO2: 24 mmol/L (ref 22–32)
Calcium: 7.4 mg/dL — ABNORMAL LOW (ref 8.9–10.3)
Chloride: 104 mmol/L (ref 98–111)
Creatinine, Ser: 0.69 mg/dL (ref 0.61–1.24)
GFR, Estimated: >60 mL/min (ref >60)
Glucose, Bld: 103 mg/dL — ABNORMAL HIGH (ref 70–99)
Potassium: 3.5 mmol/L (ref 3.5–5.1)
Sodium: 134 mmol/L — ABNORMAL LOW (ref 135–145)
Total Bilirubin: 1.2 mg/dL (ref 0.0–1.2)
Total Protein: 5.1 g/dL — ABNORMAL LOW (ref 6.5–8.1)

## 2024-04-28 LAB — PHOSPHORUS: Phosphorus: 2.1 mg/dL — ABNORMAL LOW (ref 2.5–4.6)

## 2024-04-28 LAB — APTT: aPTT: 30 s (ref 24–36)

## 2024-04-28 LAB — MAGNESIUM: Magnesium: 1.9 mg/dL (ref 1.7–2.4)

## 2024-04-28 LAB — BRAIN NATRIURETIC PEPTIDE: B Natriuretic Peptide: 469.6 pg/mL — ABNORMAL HIGH (ref 0.0–100.0)

## 2024-04-28 SURGERY — ERCP, WITH INTERVENTION IF INDICATED
Anesthesia: General

## 2024-04-28 MED ORDER — DICLOFENAC SUPPOSITORY 100 MG
RECTAL | Status: DC | PRN
  Administered 2024-04-28: 100 mg via RECTAL

## 2024-04-28 MED ORDER — ONDANSETRON HCL 4 MG/2ML IJ SOLN
INTRAMUSCULAR | Status: DC | PRN
  Administered 2024-04-28: 4 mg via INTRAVENOUS

## 2024-04-28 MED ORDER — PROPOFOL 10 MG/ML IV BOLUS
INTRAVENOUS | Status: DC | PRN
  Administered 2024-04-28: 100 mg via INTRAVENOUS

## 2024-04-28 MED ORDER — FENTANYL CITRATE (PF) 250 MCG/5ML IJ SOLN
INTRAMUSCULAR | Status: DC | PRN
  Administered 2024-04-28 (×2): 50 ug via INTRAVENOUS

## 2024-04-28 MED ORDER — PIPERACILLIN-TAZOBACTAM 3.375 G IVPB
3.3750 g | Freq: Three times a day (TID) | INTRAVENOUS | Status: DC
  Administered 2024-04-28 – 2024-05-02 (×12): 3.375 g via INTRAVENOUS
  Filled 2024-04-28 (×12): qty 50

## 2024-04-28 MED ORDER — DICLOFENAC SUPPOSITORY 100 MG
RECTAL | Status: AC
  Filled 2024-04-28: qty 1

## 2024-04-28 MED ORDER — SODIUM CHLORIDE 0.9 % IV SOLN: INTRAVENOUS | Status: DC

## 2024-04-28 MED ORDER — SODIUM CHLORIDE 0.9 % IV SOLN
INTRAVENOUS | Status: DC | PRN
  Administered 2024-04-28: 30 mL

## 2024-04-28 MED ORDER — PHENYLEPHRINE HCL-NACL 20-0.9 MG/250ML-% IV SOLN
INTRAVENOUS | Status: DC | PRN
  Administered 2024-04-28: 60 ug/min via INTRAVENOUS

## 2024-04-28 MED ORDER — DEXAMETHASONE SODIUM PHOSPHATE 10 MG/ML IJ SOLN
INTRAMUSCULAR | Status: DC | PRN
  Administered 2024-04-28: 10 mg via INTRAVENOUS

## 2024-04-28 MED ORDER — PHENYLEPHRINE 80 MCG/ML (10ML) SYRINGE FOR IV PUSH (FOR BLOOD PRESSURE SUPPORT)
PREFILLED_SYRINGE | INTRAVENOUS | Status: DC | PRN
  Administered 2024-04-28 (×2): 160 ug via INTRAVENOUS

## 2024-04-28 MED ORDER — LIDOCAINE 2% (20 MG/ML) 5 ML SYRINGE
INTRAMUSCULAR | Status: DC | PRN
  Administered 2024-04-28: 80 mg via INTRAVENOUS

## 2024-04-28 MED ORDER — ROCURONIUM BROMIDE 10 MG/ML (PF) SYRINGE
PREFILLED_SYRINGE | INTRAVENOUS | Status: DC | PRN
  Administered 2024-04-28: 60 mg via INTRAVENOUS

## 2024-04-28 MED ORDER — LACTATED RINGERS IV SOLN: INTRAVENOUS | Status: DC | PRN

## 2024-04-28 MED ORDER — FENTANYL CITRATE (PF) 100 MCG/2ML IJ SOLN
INTRAMUSCULAR | Status: AC
  Filled 2024-04-28: qty 2

## 2024-04-28 MED ORDER — GLUCAGON HCL RDNA (DIAGNOSTIC) 1 MG IJ SOLR
INTRAMUSCULAR | Status: AC
  Filled 2024-04-28: qty 1

## 2024-04-28 MED ORDER — POTASSIUM PHOSPHATES 15 MMOLE/5ML IV SOLN
30.0000 mmol | Freq: Once | INTRAVENOUS | Status: AC
  Administered 2024-04-28: 30 mmol via INTRAVENOUS
  Filled 2024-04-28: qty 10

## 2024-04-28 MED ORDER — CIPROFLOXACIN IN D5W 400 MG/200ML IV SOLN
INTRAVENOUS | Status: AC
  Filled 2024-04-28: qty 200

## 2024-04-28 MED ORDER — SUGAMMADEX SODIUM 200 MG/2ML IV SOLN
INTRAVENOUS | Status: DC | PRN
  Administered 2024-04-28: 200 mg via INTRAVENOUS

## 2024-04-28 NOTE — Op Note (Signed)
 [facility] Patient Name: Tony Lopez Procedure Date : 04/28/2024 MRN: [id number] Attending MD: Genell Ken , MD, [id number] Date of Birth: 1951-03-07 CSN: [id number] Age: 73 Admit Type: Inpatient Procedure:                ERCP Indications:              Treatment of bile leak after subtotal                            cholecystectomy Providers:                Genell Ken, MD, Perla Bradford, RN, Gabino Joe,                            Technician Referring MD:             [facility] Medicines:                Monitored Anesthesia Care Complications:            No immediate complications. Estimated Blood Loss:     Estimated blood loss: none. Procedure:                Pre-Anesthesia Assessment:                           - Prior to the procedure, a History and Physical                            was performed, and patient medications and                            allergies were reviewed. The patient's tolerance of                            previous anesthesia was also reviewed. The risks                            and benefits of the procedure and the sedation                            options and risks were discussed with the patient.                            All questions were answered, and informed consent                            was obtained. Prior Anticoagulants: The patient has                            taken no anticoagulant or antiplatelet agents. ASA                            Grade Assessment: III - A patient with severe  systemic disease. After reviewing the risks and                            benefits, the patient was deemed in satisfactory                            condition to undergo the procedure.                           After obtaining informed consent, the scope was                            passed under direct vision. Throughout the                            procedure, the  patient's blood pressure, pulse, and                            oxygen saturations were monitored continuously. The                            [id number]-Q190V ([id number]) Olympus duodenoscope was                            introduced through the mouth, and used to inject                            contrast into and used to inject contrast into the                            bile duct. The ERCP was accomplished without                            difficulty. The patient tolerated the procedure                            well. Scope In: Scope Out: Findings:      The scout film was normal. The esophagus was successfully intubated       under direct vision. The scope was advanced to a normal major papilla in       the descending duodenum without detailed examination of the pharynx,       larynx and associated structures, and upper GI tract. The upper GI tract       was grossly normal.      The ampulla was noted in the outer rim of dudoenal diverticulum.      The bile duct was deeply cannulated with the sphincterotome. Contrast       was injected. I personally interpreted the bile duct images. There was       brisk flow of contrast through the ducts. Image quality was excellent.       Contrast extended to the main bile duct.      Frank extravasation of contrast originating from the cystic duct was       observed.      The lower third of the main bile duct contained  two stones, the largest       of which was 6 mm in diameter. The CBD was not dilated.      A straight Roadrunner wire was passed into the biliary tree.      A 10 mm biliary sphincterotomy was made with a monofilament       sphincterotome using ERBE electrocautery. There was no       post-sphincterotomy bleeding.      The biliary tree was swept with a 12 mm balloon starting at the       bifurcation.      Sludge was swept from the duct.      All stones were removed.      One 7 Fr by 7 cm plastic stent with a single external flap and a  single       internal flap was placed 6.5 cm into the common bile duct. Bile flowed       through the stent. The stent was in good position.      The pancreatic duct was not canulated or injected during the procedure. Impression:               - A bile leak was found.                           - Choledocholithiasis was found. Complete removal                            was accomplished by biliary sphincterotomy and                            balloon extraction.                           - A biliary sphincterotomy was performed.                           - The biliary tree was swept.                           - One plastic stent was placed into the common bile                            duct. Moderate Sedation:      Patient did not receive moderate sedation for this procedure, but       instead received monitored anesthesia care. Recommendation:           - Advance diet as tolerated.                           - Will need monitoring of drain, once the drain can                            be removed for bile leak, patient will need EGD as                            an outpatient in 6-8 weeks for removal of temporary  plastic CBD stent. Procedure Code(s):        --- Professional ---                           [phone], Endoscopic retrograde                            cholangiopancreatography (ERCP); with placement of                            endoscopic stent into biliary or pancreatic duct,                            including pre- and post-dilation and guide wire                            passage, when performed, including sphincterotomy,                            when performed, each stent                           43264, Endoscopic retrograde                            cholangiopancreatography (ERCP); with removal of                            calculi/debris from biliary/pancreatic duct(s)                           60454, Endoscopic catheterization of the  biliary                            ductal system, radiological supervision and                            interpretation Diagnosis Code(s):        --- Professional ---                           K83.9, Disease of biliary tract, unspecified                           K80.50, Calculus of bile duct without cholangitis                            or cholecystitis without obstruction                           K83.8, Other specified diseases of biliary tract CPT copyright 2022 American Medical Association. All rights reserved. The codes documented in this report are preliminary and upon coder review may  be revised to meet current compliance requirements. Genell Ken, MD 04/28/2024 8:39:47 AM This report has been signed electronically. Number of Addenda: 0

## 2024-04-28 NOTE — Plan of Care (Signed)
°  Problem: Education: Goal: Knowledge of General Education information will improve Description: Including pain rating scale, medication(s)/side effects and non-pharmacologic comfort measures Outcome: Progressing   Problem: Health Behavior/Discharge Planning: Goal: Ability to manage health-related needs will improve Outcome: Progressing   Problem: Clinical Measurements: Goal: Will remain free from infection Outcome: Progressing Goal: Diagnostic test results will improve Outcome: Progressing Goal: Cardiovascular complication will be avoided Outcome: Progressing   Problem: Activity: Goal: Risk for activity intolerance will decrease Outcome: Progressing   Problem: Nutrition: Goal: Adequate nutrition will be maintained Outcome: Progressing

## 2024-04-28 NOTE — Transfer of Care (Signed)
 Immediate Anesthesia Transfer of Care Note  Patient: Tony Lopez  Procedure(s) Performed: ERCP, WITH INTERVENTION IF INDICATED ERCP, WITH LITHROTRIPSY OR REMOVAL OF COMMON BILE DUCT CALCULUS USING BALLOON INSERTION, STENT, BILE DUCT SPHINCTEROTOMY,BILIARY  Patient Location: PACU  Anesthesia Type:General  Level of Consciousness: awake, alert , and oriented  Airway & Oxygen Therapy: Patient Spontanous Breathing and Patient connected to nasal cannula oxygen  Post-op Assessment: Report given to RN and Post -op Vital signs reviewed and stable  Post vital signs: Reviewed and stable  Last Vitals:  Vitals Value Taken Time  BP 128/67 04/28/24 0842  Temp 97   Pulse 81 04/28/24 0845  Resp 16 04/28/24 0845  SpO2 89 % 04/28/24 0845  Vitals shown include unfiled device data.  Last Pain:  Vitals:   04/28/24 0705  TempSrc: Temporal  PainSc: 3          Complications: No notable events documented.

## 2024-04-28 NOTE — TOC Progression Note (Signed)
 Transition of Care [facility]) - Progression Note    Patient Details  Name: Tony Lopez MRN: [id number] Date of Birth: 11-11-1951  [facility] of [facility]) CM/SW Contact  Jannice Mends, LCSW Phone Number: 04/28/2024, 12:53 PM  Clinical Narrative:    CSW received consult for possible SNF placement at time of discharge. CSW spoke with patient. Patient expressed understanding of PT recommendation and is agreeable to SNF placement at time of discharge as he does feel very weak. He reported being grateful for his ex-wife and daughter for having him come to the hospital. He would like them to be involved in DC planning. CSW discussed insurance authorization process and will provide Medicare SNF ratings list. CSW will send out referrals for review and provide bed offers as available.   Skilled Nursing Rehab Facilities-   [email or web address]   Ratings out of 5 stars (5 the highest)  Name Address  Phone # Quality Care Staffing Health Inspection Overall  [facility] [address] [phone] 3 1 4 3   [facility] [address] [place] [phone] 5 2 4 5   [facility] [address], [place]le [phone] 2 1 1 1   [facility] [address], [place] [phone] 4 3 4 4   [facility]  [address], [place] [phone] 5 3 5 5   [facility] [address], [facility] [phone] 5 3 2 3   [facility] [address] [place] [phone] 5 1 2 2   [facility] [phone] [address] [place] [phone] 2 3 3 3   [address] ([place]) [address] [place] [phone] 1 3 3 2   [facility] [address] [place], [place] [phone] 3 1 2 1   [facility] ([place]) [address], [place] [phone] 3 1 1 1   [facility] [address] [place] [phone] 3 3 4 4   [facility] [address] [place] [phone] 4  3 3   [facility] ([place]) [address], [place] [phone] 1 1 4 2           [facility] [address] [place] [phone] 3 1 5 4   [facility] [address] [place] [phone] 4 1 1 1   [facility]  [address], [place] [phone] 2 3 2 2   [facility] [address] [phone] 2 2 4 4   [facility] [address], [place] [phone] 2 2 3 3           [facility] [address] [place] [phone] 2 1 2 1   [facility] (No [employer]) [address], [place] [phone] 5 4 5 5   [facility] [address] [facility] [phone] 3 4 2 2   [facility] [address] [place] [phone] 3 1 2 1   [facility] [address] [place] [phone] 3 2 2 2   [facility] [address] [place] [phone] 1 3 3 2   [facility] [address], [place] [phone] 2 2 3 3   [facility] [address] [place], [place] [phone] 2 1 4 3   [facility] [address], [facility] [phone] 2 1 1 1   [facility] [address] [place], [place] [phone] 2 1 2 1   [facility] [address] [place]com Lily [phone] 3 1 2 1           [facility] [address] [place] [phone] 4  1 2 1   [facility] [address], Albertine Alpha  [phone] 3 4 4 4   [facility] (No [employer]) [address], [place] [phone] 3 2 4 4    [facility]) [address], [place] [phone] 2 2 3 3   [facility] [address], [place] [phone] 5 3 5 5   [facility] [address], [place] [phone] 2 1 1 1   [facility] [address] [place], [place] [phone] 3 5 5 5           [facility] [address] [place], [place] [phone] 5 4 5 5   [facility])  [address] [place] [phone] 1 1 2 1   [facility]) [address], [place] [phone]  2 4 4   [facility] [address], [place] [phone] 3 5 5 5   [address] [place], [place] [phone] 4 2 2 2   [facility]) [address] [place] [phone] 1 1 3 1       Expected Discharge Plan: Skilled Nursing Facility Barriers to Discharge: Continued Medical Work up, English as a second language teacher, SNF Pending bed offer  Expected Discharge Plan and Services In-house Referral: Clinical Social Work   Post Acute Care Choice: Skilled Nursing Facility Living arrangements for the past 2 months: Single Family Home                                       Social Determinants of Health (SDOH) Interventions SDOH Screenings   Food Insecurity: No Food Insecurity (04/19/2024)  Housing: Low Risk  (04/19/2024)  Transportation Needs: No Transportation Needs (04/19/2024)  Utilities: Not At Risk (04/19/2024)  Tobacco Use: High Risk (04/28/2024)    Readmission Risk Interventions     No data to display

## 2024-04-28 NOTE — Interval H&P Note (Signed)
 History and Physical Interval Note: 72/male with bile leak post subtotal cholecystectomy for ERCP with stent placement with propofol .  04/28/2024 7:36 AM  Tony Lopez  has presented today for ERCP with stent placement with propofol ,  with the diagnosis of Bile Leak; Abdominal pain, s/p subtotal cholecystectomy .  The various methods of treatment have been discussed with the patient and family. After consideration of risks, benefits and other options for treatment, the patient has consented to  Procedure(s): ERCP, WITH INTERVENTION IF INDICATED (N/A) as a surgical intervention.  The patient's history has been reviewed, patient examined, no change in status, stable for surgery.  I have reviewed the patient's chart and labs.  Questions were answered to the patient's satisfaction.     Tony Lopez

## 2024-04-28 NOTE — Progress Notes (Signed)
 Progress Note    Subjective: Alert and reports mild RUQ pain. No other complaints. Underwent ERCP this AM.   Objective: Vital signs in last 24 hours: Temp:  [97.3 F (36.3 C)-98.4 F (36.9 C)] 97.3 F (36.3 C) (06/02 0939) Pulse Rate:  [61-80] 70 (06/02 0939) Resp:  [14-21] 20 (06/02 0939) BP: (101-149)/(56-76) 118/69 (06/02 0939) SpO2:  [88 %-99 %] 99 % (06/02 0939) Last BM Date : 04/21/24  Intake/Output from previous day: 06/01 0701 - 06/02 0700 In: -  Out: 2170 [Urine:1375; Drains:795] Intake/Output this shift: Total I/O In: 700 [I.V.:700] Out: -   PE: Heart: regular, rate, and rhythm.  Lungs: Respiratory effort nonlabored Abd: soft, mildly TTP in RUQ, incisions C/D/I, drain with bilious fluid   Lab Results:  Recent Labs    04/27/24 1414 04/28/24 0342  WBC 13.3* 9.8  HGB 8.4* 7.8*  HCT 25.1* 23.5*  PLT 386 414*   BMET Recent Labs    04/27/24 0817 04/28/24 0342  NA 136 134*  K 3.5 3.5  CL 105 104  CO2 23 24  GLUCOSE 100* 103*  BUN 10 8  CREATININE 0.66 0.69  CALCIUM 7.4* 7.4*   PT/INR Recent Labs    04/28/24 0342  LABPROT 14.0  INR 1.1   CMP     Component Value Date/Time   NA 134 (L) 04/28/2024 0342   K 3.5 04/28/2024 0342   CL 104 04/28/2024 0342   CO2 24 04/28/2024 0342   GLUCOSE 103 (H) 04/28/2024 0342   BUN 8 04/28/2024 0342   CREATININE 0.69 04/28/2024 0342   CALCIUM 7.4 (L) 04/28/2024 0342   PROT 5.1 (L) 04/28/2024 0342   ALBUMIN  1.8 (L) 04/28/2024 0342   AST 39 04/28/2024 0342   ALT 30 04/28/2024 0342   ALKPHOS 112 04/28/2024 0342   BILITOT 1.2 04/28/2024 0342   GFRNONAA >60 04/28/2024 0342   GFRAA >60 10/06/2017 0501   Lipase     Component Value Date/Time   LIPASE 266 (H) 04/20/2024 0528       Studies/Results: No results found.   Anti-infectives: Anti-infectives (From admission, onward)    Start     Dose/Rate Route Frequency Ordered Stop   04/28/24 1200  piperacillin-tazobactam (ZOSYN) IVPB 3.375 g         3.375 g 12.5 mL/hr over 240 Minutes Intravenous Every 8 hours 04/28/24 0936     04/26/24 1600  Ampicillin-Sulbactam (UNASYN) 3 g in sodium chloride  0.9 % 100 mL IVPB  Status:  Discontinued        3 g 200 mL/hr over 30 Minutes Intravenous Every 6 hours 04/26/24 1506 04/28/24 0936   04/26/24 1545  ciprofloxacin (CIPRO) IVPB 400 mg  Status:  Discontinued        400 mg 200 mL/hr over 60 Minutes Intravenous Every 12 hours 04/26/24 1456 04/28/24 0936   04/26/24 1400  DAPTOmycin  (CUBICIN ) IVPB 700 mg/174mL premix  Status:  Discontinued        8 mg/kg  88.3 kg 200 mL/hr over 30 Minutes Intravenous Daily 04/25/24 1552 04/26/24 1456   04/26/24 1200  piperacillin-tazobactam (ZOSYN) IVPB 3.375 g  Status:  Discontinued        3.375 g 12.5 mL/hr over 240 Minutes Intravenous Every 8 hours 04/26/24 0941 04/26/24 1456   04/25/24 1345  DAPTOmycin  (CUBICIN ) IVPB 700 mg/122mL premix        8 mg/kg  88.3 kg 200 mL/hr over 30 Minutes Intravenous STAT 04/25/24 1332 04/25/24 1419   04/23/24 1430  ceFEPIme  (MAXIPIME ) 2 g in sodium chloride  0.9 % 100 mL IVPB  Status:  Discontinued        2 g 200 mL/hr over 30 Minutes Intravenous Every 8 hours 04/23/24 1337 04/26/24 0941   04/22/24 0400  cefTRIAXone  (ROCEPHIN ) 2 g in sodium chloride  0.9 % 100 mL IVPB  Status:  Discontinued        2 g 200 mL/hr over 30 Minutes Intravenous Every 24 hours 04/22/24 0333 04/23/24 1337        Assessment/Plan  E. Coli Bacteremia  Acute cholecystitis  POD3 s/p laparoscopic subtotal cholecystectomy Dr. Camilo Cella - postop bile leak - s/p ERCP with stent placement and clearance of choledocholithiasis today  - continue drain with large volume bilious drainage - continue abx another 2 days post-op  - diet as tolerated  FEN: soft diet, ensure VTE: SCDs, ok for DVT prophylaxis when cleared from GI standpoint  ID: Rocephin    - per TRH -  Acute alcohol withdrawal with DTs AKI, resolved Chronic thrombocytopenia - plts 414 Left  renal lesion - outpatient follow up imaging recommended  Tobacco abuse   LOS: 10 days     Tony Lopez, [facility] 04/28/2024, 10:18 AM Please see Amion for pager number during day hours 7:00am-4:30pm

## 2024-04-28 NOTE — Plan of Care (Signed)

## 2024-04-28 NOTE — Anesthesia Preprocedure Evaluation (Signed)
 Anesthesia Evaluation  Patient identified by MRN, date of birth, ID band Patient awake    Reviewed: Allergy & Precautions, H&P , NPO status , Patient's Chart, lab work & pertinent test results  Airway Mallampati: II   Neck ROM: full  Mouth opening: Limited Mouth Opening  Dental   Pulmonary Current Smoker and Patient abstained from smoking.   breath sounds clear to auscultation       Cardiovascular hypertension,  Rhythm:regular Rate:Normal     Neuro/Psych    GI/Hepatic ,GERD  ,,(+) Hepatitis -, C  Endo/Other    Renal/GU      Musculoskeletal  (+) Arthritis ,    Abdominal   Peds  Hematology   Anesthesia Other Findings   Reproductive/Obstetrics                             Anesthesia Physical Anesthesia Plan  ASA: 2  Anesthesia Plan: General   Post-op Pain Management:    Induction: Intravenous  PONV Risk Score and Plan: 1 and Ondansetron , Dexamethasone , Midazolam  and Treatment may vary due to age or medical condition  Airway Management Planned: Oral ETT  Additional Equipment:   Intra-op Plan:   Post-operative Plan: Extubation in OR  Informed Consent: I have reviewed the patients History and Physical, chart, labs and discussed the procedure including the risks, benefits and alternatives for the proposed anesthesia with the patient or authorized representative who has indicated his/her understanding and acceptance.     Dental advisory given  Plan Discussed with: CRNA, Anesthesiologist and Surgeon  Anesthesia Plan Comments:        Anesthesia Quick Evaluation

## 2024-04-28 NOTE — Progress Notes (Signed)
 PROGRESS NOTE        PATIENT DETAILS Name: Tony Lopez Age: 73 y.o. Sex: male Date of Birth: May 09, 1951 Admit Date: 04/18/2024 Admitting Physician [name], MD WGN:[id number], [name], [name]  Brief Summary: Patient is a 73 y.o.  male with history of HTN, prostate cancer, GERD, EtOH use-who was admitted for alcohol withdrawal with delirium tremens.  Hospital course complicated by polymicrobial bacteremia and acute calculus cholecystitis.    Significant events: 5/23>> admit to [facility]. 5/26>>Fever-blood culture- gm neg rod/gm +ve rod 5/27>> RUQ tender-CCS consulted. 5/28>> HIDA scan +ve  Significant studies: 5/23>> CT head: No acute intracranial abnormality 5/23>> CT abdomen/pelvis: No acute abnormality. 5/24>> RUQ ultrasound: Biliary sludge/small gallstones.  No evidence of acute cholecystitis. 5/27>> RUQ ultrasound: Distended gallbladder with cholelithiasis/sludge. 5/28>> HIDA scan: Nonvisualization of the gallbladder  Significant microbiology data: 5/25>> GI pathogen panel: Negative 5/26>> blood culture: E. coli/Morganella/gram-positive cocci (BCID staph epi/E. coli-suspect staph epi is a contamination) 5/28>> blood cultures: No growth  Procedures: 5/30>> laparoscopic subtotal cholecystectomy with drain placement. ERCP by [facility] on 04/28/2024 ERCP with 7 French x 7 cm plastic CBD stent placement has been performed uneventfully. [name] bile leak was noted through cystic duct , sludge and 2 CBD stones were removed  TTE 04/28/24 >>  Consults: GI CCS ID  Subjective:   Patient in bed, appears comfortable, denies any headache, no fever, no chest pain or pressure, no shortness of breath , no abdominal pain. No focal weakness.  Objective: Vitals: Blood pressure (!) 149/72, pulse 70, temperature (!) 97.5 F (36.4 C), temperature source Temporal, resp. rate 18, height 6' (1.829 m), weight 88.3 kg, SpO2 95%.   Exam:  Awake Alert, No new F.N deficits,  Normal affect Strawberry.AT,PERRAL Supple Neck, No JVD,   Symmetrical Chest wall movement, Good air movement bilaterally, CTAB RRR,No Gallops, Rubs or new Murmurs,  +ve B.Sounds, Abd Soft, mild RUQ tenderness, gallbladder drain in place No Cyanosis, Clubbing or edema    Assessment/Plan:  EtOH withdrawal with DTs Much improved after supportive care, DTs seem to have now resolved.  Counseled to abstain from alcohol use.  Sinus tachycardia with PACs Secondary to DTs Tachycardia has essentially resolved-with treatment of underlying DTs-and with initiation of low-dose beta-blockers.  Polymicrobial bacteremia (E. coli/Morganella/Enterococcus) E acute calculus cholecystitis, choledocholithiasis S/p subtotal laparoscopic cholecystectomy 5/13 ID following, general surgery and GI following, currently on combination of Unasyn and Cipro Right upper quadrant ultrasound noted with cholelithiasis and sludge, subsequent HIDA scan positive, GI general surgery on board, underwent ERCP by [facility] on 04/28/2024 - 7 French x 7 cm plastic CBD stent placement has been performed uneventfully. [name] bile leak was noted through cystic duct , sludge and 2 CBD stones were removed. TTE ordered 5/31 - pending   Elevated lipase level Initially not felt to have abdominal pain-no evidence of pancreatitis on CT GI on board underwent ERCP suggesting CBD stones which were removed on 04/28/2024 by [facility].  EtOH hepatitis Transaminases have essentially normalized with supportive care Per GI note-no indication for steroid as discriminant function I<10.  AKI Hemodynamically mediated-poor oral intake/use of lisinopril /HCTZ Resolved with supportive care.  Hyponatremia Secondary to dehydration/poor oral intake/HCTZ use Resolved.  Hypomagnesemia/hypophosphatemia Replete/recheck.  Diarrhea Prn immodium GI pathogen panel negative Thankfully diarrhea has resolved.  Thrombocytopenia Secondary to EtOH  use Resolved.  Normocytic anemia Probably related to EtOH  related bone marrow suppression, trace Hemoccult positive stool, no signs of brisk bleeding, some drop due to heme dilution and perioperative blood loss.  Getting 1 unit of packed RBC on 04/27/2024.  On PPI continue to monitor.  Posttransfusion H&H on 04/28/2024 stable.  Hypophosphatemia.  Replace.   Guaiac positive stool Outpatient GI follow-up-as no evidence of active bleeding.  PPI.  1.6 x 2.4 cm structure arising from left kidney Incidental finding on CT abdomen. Per radiology-favored to represent proteinaceous/hemorrhagic cyst Will need outpatient imaging postdischarge.  Tobacco abuse Transdermal nicotine  Some wheezing today-longstanding history-unclear if he has underlying COPD-starting bronchodilators and seeing how he does.  Debility/deconditioning Secondary to acute illness PT OT eval-SNF recommended  BMI: Estimated body mass index is 26.4 kg/m as calculated from the following:   Height as of this encounter: 6' (1.829 m).   Weight as of this encounter: 88.3 kg.   Code status:   Code Status: Full Code   DVT Prophylaxis: SCDs Start: 04/18/24 2024   Family Communication: Ex wife-Nikki Soules [phone] updated over the phone 5/31   Disposition Plan: Status is: Inpatient Remains inpatient appropriate because: Severity of illness   Planned Discharge Destination: SNF   Diet: Diet Order             DIET SOFT Fluid consistency: Thin  Diet effective now                   MEDICATIONS: Scheduled Meds:  [MAR Hold] acetaminophen   500 mg Oral TID WC & HS   [MAR Hold] allopurinol   100 mg Oral Daily   [MAR Hold] feeding supplement  237 mL Oral BID BM   [MAR Hold] folic acid   1 mg Oral Daily   [MAR Hold] ipratropium  0.5 mg Nebulization BID   [MAR Hold] levalbuterol   0.63 mg Nebulization BID   [MAR Hold] lidocaine   1 patch Transdermal Q24H   [MAR Hold] metoprolol  tartrate  25 mg Oral BID   [MAR  Hold] multivitamin with minerals  1 tablet Oral Daily   [MAR Hold] nicotine   21 mg Transdermal Daily   [MAR Hold] pantoprazole  (PROTONIX ) IV  40 mg Intravenous Q12H   [MAR Hold] polycarbophil  625 mg Oral BID   [MAR Hold] thiamine   100 mg Oral Daily   Or   [MAR Hold] thiamine   100 mg Intravenous Daily   Continuous Infusions:  sodium chloride  75 mL/hr at 04/28/24 0641   [MAR Hold] ampicillin-sulbactam (UNASYN) IV 3 g (04/28/24 0347)   [MAR Hold] ciprofloxacin 400 mg (04/28/24 0519)   [MAR Hold] potassium PHOSPHATE IVPB (in mmol)     PRN Meds:.[MAR Hold] acetaminophen , [MAR Hold] alum & mag hydroxide-simeth, [MAR Hold] bisacodyl, [MAR Hold] diphenhydrAMINE , [MAR Hold]  HYDROmorphone  (DILAUDID ) injection, [MAR Hold] loperamide , [MAR Hold] magic mouthwash, [MAR Hold] menthol-cetylpyridinium, [MAR Hold] methocarbamol (ROBAXIN) injection **OR** [MAR Hold] methocarbamol, [MAR Hold] naphazoline-glycerin, [MAR Hold] ondansetron  **OR** [MAR Hold] ondansetron  (ZOFRAN ) IV, [MAR Hold] oxyCODONE , [MAR Hold] phenol, [MAR Hold] prochlorperazine, [MAR Hold] simethicone, [MAR Hold] sodium chloride    I have personally reviewed following labs and imaging studies  LABORATORY DATA:  Data Review:   Inpatient Medications  Scheduled Meds:  [MAR Hold] acetaminophen   500 mg Oral TID WC & HS   [MAR Hold] allopurinol   100 mg Oral Daily   [MAR Hold] feeding supplement  237 mL Oral BID BM   [MAR Hold] folic acid   1 mg Oral Daily   [MAR Hold] ipratropium  0.5 mg Nebulization BID   [facility]  Hold] levalbuterol   0.63 mg Nebulization BID   [MAR Hold] lidocaine   1 patch Transdermal Q24H   [MAR Hold] metoprolol  tartrate  25 mg Oral BID   [MAR Hold] multivitamin with minerals  1 tablet Oral Daily   [MAR Hold] nicotine   21 mg Transdermal Daily   [MAR Hold] pantoprazole  (PROTONIX ) IV  40 mg Intravenous Q12H   [MAR Hold] polycarbophil  625 mg Oral BID   [MAR Hold] thiamine   100 mg Oral Daily   Or   [MAR Hold] thiamine   100  mg Intravenous Daily   Continuous Infusions:  sodium chloride  75 mL/hr at 04/28/24 0641   [MAR Hold] ampicillin-sulbactam (UNASYN) IV 3 g (04/28/24 0347)   [MAR Hold] ciprofloxacin 400 mg (04/28/24 0519)   [MAR Hold] potassium PHOSPHATE IVPB (in mmol)     PRN Meds:.[MAR Hold] acetaminophen , [MAR Hold] alum & mag hydroxide-simeth, [MAR Hold] bisacodyl, [MAR Hold] diphenhydrAMINE , [MAR Hold]  HYDROmorphone  (DILAUDID ) injection, [MAR Hold] loperamide , [MAR Hold] magic mouthwash, [MAR Hold] menthol-cetylpyridinium, [MAR Hold] methocarbamol (ROBAXIN) injection **OR** [MAR Hold] methocarbamol, [MAR Hold] naphazoline-glycerin, [MAR Hold] ondansetron  **OR** [MAR Hold] ondansetron  (ZOFRAN ) IV, [MAR Hold] oxyCODONE , [MAR Hold] phenol, [MAR Hold] prochlorperazine, [MAR Hold] simethicone, [MAR Hold] sodium chloride   DVT Prophylaxis  SCDs Start: 04/18/24 2024  Recent Labs  Lab 04/25/24 0610 04/26/24 0811 04/27/24 0307 04/27/24 1414 04/28/24 0342  WBC 16.7* 15.1* 13.4* 13.3* 9.8  HGB 7.8* 7.0* 6.8* 8.4* 7.8*  HCT 22.8* 20.7* 20.8* 25.1* 23.5*  PLT 209 260 325 386 414*  MCV 97.4 101.5* 102.5* 98.8 97.1  MCH 33.3 34.3* 33.5 33.1 32.2  MCHC 34.2 33.8 32.7 33.5 33.2  RDW 15.5 15.4 15.3 17.1* 16.9*  LYMPHSABS  --   --   --  1.5 1.2  MONOABS  --   --   --  0.5 0.3  EOSABS  --   --   --  0.0 0.0  BASOSABS  --   --   --  0.0 0.0    Recent Labs  Lab 04/24/24 0451 04/25/24 0610 04/26/24 0811 04/27/24 0307 04/27/24 0817 04/28/24 0342  NA 139 136 139 137 136 134*  K 3.6 3.5 3.8 3.4* 3.5 3.5  CL 106 105 108 106 105 104  CO2 23 22 23 23 23 24   ANIONGAP 10 9 8 8 8 6   GLUCOSE 99 107* 104* 105* 100* 103*  BUN 12 13 12 12 10 8   CREATININE 0.83 0.79 0.76 0.75 0.66 0.69  AST 36 38  --  54* 49* 39  ALT 33 31  --  32 34 30  ALKPHOS 123 126  --  108 104 112  BILITOT 2.1* 1.9*  --  1.2 1.5* 1.2  ALBUMIN  1.8* 1.8*  --  1.8* 1.8* 1.8*  INR  --   --   --   --   --  1.1  BNP  --   --   --   --   --   469.6*  MG 1.8 1.7 2.1  --  2.0 1.9  PHOS 2.7 2.2* 2.7  --  2.1* 2.1*  CALCIUM 7.7* 7.7* 7.5* 7.3* 7.4* 7.4*      Recent Labs  Lab 04/24/24 0451 04/25/24 0610 04/26/24 0811 04/27/24 0307 04/27/24 0817 04/28/24 0342  INR  --   --   --   --   --  1.1  BNP  --   --   --   --   --  469.6*  MG 1.8 1.7  2.1  --  2.0 1.9  CALCIUM 7.7* 7.7* 7.5* 7.3* 7.4* 7.4*    Radiology Reports  No results found.    Signature  -   Lynnwood Sauer M.D on 04/28/2024 at 8:50 AM   -  To page go to [email or web address]

## 2024-04-28 NOTE — Progress Notes (Signed)
 [facility]  Date of Admission:  04/18/2024     Reason for Follow Up: Empyema of gallbladder  Total days of antibiotics 8         ASSESSMENT:  Mr. Tony Lopez is TTE was without evidence of endocarditis however valves were not visualized in the setting of polymicrobial bacteremia and acute calculus choledocholithiasis s/p subtotal cholecystectomy.  ERCP completed with findings of bile leak and choledocholithiasis with extraction and stent placement. Blood cultures show clearance of bacteremia and finalized without growth. Will change antibiotics to piperacillin/tazobactam with final duration pending as may need to consider TEE in the setting of enterococcus bacteremia with valves not being well visualized. Continue universal/standard precautions. Remaining medical and supportive care per Internal Medicine.   PLAN:  Change antibiotics to piperacillin/tazobactam.  May need TEE to rule out endocarditis Post-operative wound care and drain management per GI and General Surgery. Universal/standard precautions. Remaining medical and supportive care per Internal Medicine.   Principal Problem:   Empyema of gallbladder Active Problems:   Prostatic adenocarcinoma (HCC)   Hepatitis C antibody positive in blood   Alcohol withdrawal (HCC)   Alcoholic hepatitis   Hyponatremia   AKI (acute kidney injury) (HCC)   GI bleed   Acute cholecystitis with chronic cholecystitis   Bile leak    acetaminophen   500 mg Oral TID WC & HS   allopurinol   100 mg Oral Daily   feeding supplement  237 mL Oral BID BM   folic acid   1 mg Oral Daily   ipratropium  0.5 mg Nebulization BID   levalbuterol   0.63 mg Nebulization BID   lidocaine   1 patch Transdermal Q24H   metoprolol  tartrate  25 mg Oral BID   multivitamin with minerals  1 tablet Oral Daily   nicotine   21 mg Transdermal Daily   pantoprazole  (PROTONIX ) IV  40 mg Intravenous Q12H   polycarbophil  625 mg Oral BID   thiamine    100 mg Oral Daily    SUBJECTIVE:  Afebrile overnight with no acute events. Tolerating antibiotics with no adverse side effects. Feeling lethargic and loopy following ERCP this morning.   No Known Allergies   Review of Systems: Review of Systems  Constitutional:  Negative for chills, fever and weight loss.  Respiratory:  Negative for cough, shortness of breath and wheezing.   Cardiovascular:  Negative for chest pain and leg swelling.  Gastrointestinal:  Positive for abdominal pain. Negative for constipation, diarrhea, nausea and vomiting.  Skin:  Negative for rash.      OBJECTIVE: Vitals:   04/28/24 0910 04/28/24 0939 04/28/24 1000 04/28/24 1126  BP: 125/75 118/69 121/73 133/83  Pulse: 71 70 70 76  Resp: 15 20    Temp:  (!) 97.3 F (36.3 C)  97.9 F (36.6 C)  TempSrc:  Oral  Rectal  SpO2: 99% 99%  95%  Weight:      Height:       Body mass index is 26.4 kg/m.  Physical Exam Constitutional:      General: He is not in acute distress.    Appearance: He is well-developed.  Cardiovascular:     Rate and Rhythm: Normal rate and regular rhythm.     Heart sounds: Normal heart sounds.  Pulmonary:     Effort: Pulmonary effort is normal.     Breath sounds: Normal breath sounds.  Abdominal:     Tenderness: There is abdominal tenderness.     Comments: Drain present  Skin:  General: Skin is warm and dry.  Neurological:     Mental Status: He is alert.     Lab Results Lab Results  Component Value Date   WBC 9.8 04/28/2024   HGB 7.8 (L) 04/28/2024   HCT 23.5 (L) 04/28/2024   MCV 97.1 04/28/2024   PLT 414 (H) 04/28/2024    Lab Results  Component Value Date   CREATININE 0.69 04/28/2024   BUN 8 04/28/2024   NA 134 (L) 04/28/2024   K 3.5 04/28/2024   CL 104 04/28/2024   CO2 24 04/28/2024    Lab Results  Component Value Date   ALT 30 04/28/2024   AST 39 04/28/2024   ALKPHOS 112 04/28/2024   BILITOT 1.2 04/28/2024     Microbiology: Recent Results (from the  past 240 hours)  Gastrointestinal Panel by PCR , Stool     Status: None   Collection Time: 04/20/24  6:39 PM   Specimen: Stool  Result Value Ref Range Status   Campylobacter species NOT DETECTED NOT DETECTED Final   Plesimonas shigelloides NOT DETECTED NOT DETECTED Final   Salmonella species NOT DETECTED NOT DETECTED Final   Yersinia enterocolitica NOT DETECTED NOT DETECTED Final   Vibrio species NOT DETECTED NOT DETECTED Final   Vibrio cholerae NOT DETECTED NOT DETECTED Final   Enteroaggregative E coli (EAEC) NOT DETECTED NOT DETECTED Final   Enteropathogenic E coli (EPEC) NOT DETECTED NOT DETECTED Final   Enterotoxigenic E coli (ETEC) NOT DETECTED NOT DETECTED Final   Shiga like toxin producing E coli (STEC) NOT DETECTED NOT DETECTED Final   Shigella/Enteroinvasive E coli (EIEC) NOT DETECTED NOT DETECTED Final   Cryptosporidium NOT DETECTED NOT DETECTED Final   Cyclospora cayetanensis NOT DETECTED NOT DETECTED Final   Entamoeba histolytica NOT DETECTED NOT DETECTED Final   Giardia lamblia NOT DETECTED NOT DETECTED Final   Adenovirus F40/41 NOT DETECTED NOT DETECTED Final   Astrovirus NOT DETECTED NOT DETECTED Final   Norovirus GI/GII NOT DETECTED NOT DETECTED Final   Rotavirus A NOT DETECTED NOT DETECTED Final   Sapovirus (I, II, IV, and V) NOT DETECTED NOT DETECTED Final    Comment: Performed at [facility], [address] [place], [place] [address]  Culture, blood (Routine X 2) w Reflex to ID Panel     Status: Abnormal   Collection Time: 04/21/24 12:02 PM   Specimen: BLOOD LEFT HAND  Result Value Ref Range Status   Specimen Description BLOOD LEFT HAND  Final   Special Requests   Final    BOTTLES DRAWN AEROBIC AND ANAEROBIC Blood Culture adequate volume   Culture  Setup Time   Final    GRAM NEGATIVE RODS GRAM POSITIVE COCCI ANAEROBIC BOTTLE ONLY GRAM NEGATIVE RODS AEROBIC BOTTLE ONLY CRITICAL RESULT CALLED TO, READ BACK BY AND VERIFIED WITH: J WYLAND,PHARMD@[id number]   04/22/24 MK    Culture (A)  Final    ESCHERICHIA COLI STAPHYLOCOCCUS EPIDERMIDIS THE SIGNIFICANCE OF ISOLATING THIS ORGANISM FROM A SINGLE SET OF BLOOD CULTURES WHEN MULTIPLE SETS ARE DRAWN IS UNCERTAIN. PLEASE NOTIFY THE MICROBIOLOGY DEPARTMENT WITHIN ONE WEEK IF SPECIATION AND SENSITIVITIES ARE REQUIRED. Performed at [facility], [address] [place], [place] [address]    Report Status 04/25/2024 FINAL  Final   Organism ID, Bacteria ESCHERICHIA COLI  Final   Organism ID, Bacteria ESCHERICHIA COLI  Final      Susceptibility   Escherichia coli - KIRBY BAUER*    CEFAZOLIN  SENSITIVE Sensitive    Escherichia coli -  MIC*    AMPICILLIN <=2 SENSITIVE Sensitive     CEFEPIME  <=0.12 SENSITIVE Sensitive     CEFTAZIDIME <=1 SENSITIVE Sensitive     CEFTRIAXONE  <=0.25 SENSITIVE Sensitive     CIPROFLOXACIN <=0.25 SENSITIVE Sensitive     GENTAMICIN <=1 SENSITIVE Sensitive     IMIPENEM <=0.25 SENSITIVE Sensitive     TRIMETH /SULFA  <=20 SENSITIVE Sensitive     AMPICILLIN/SULBACTAM <=2 SENSITIVE Sensitive     PIP/TAZO <=4 SENSITIVE Sensitive ug/mL    * ESCHERICHIA COLI    ESCHERICHIA COLI  Culture, blood (Routine X 2) w Reflex to ID Panel     Status: Abnormal   Collection Time: 04/21/24 12:02 PM   Specimen: BLOOD RIGHT ARM  Result Value Ref Range Status   Specimen Description BLOOD RIGHT ARM  Final   Special Requests   Final    BOTTLES DRAWN AEROBIC ONLY Blood Culture adequate volume   Culture  Setup Time   Final    GRAM NEGATIVE RODS GRAM POSITIVE COCCI AEROBIC BOTTLE ONLY Performed at [facility], [address], [place], [place] [address]    Culture (A)  Final    MORGANELLA MORGANII ESCHERICHIA COLI SUSCEPTIBILITIES PERFORMED ON PREVIOUS CULTURE WITHIN THE LAST 5 DAYS. ENTEROCOCCUS AVIUM    Report Status 04/26/2024 FINAL  Final   Organism ID, Bacteria MORGANELLA MORGANII  Final   Organism ID, Bacteria ENTEROCOCCUS AVIUM  Final      Susceptibility   Enterococcus  avium - MIC*    AMPICILLIN <=2 SENSITIVE Sensitive     VANCOMYCIN <=0.5 SENSITIVE Sensitive     GENTAMICIN SYNERGY SENSITIVE Sensitive     * ENTEROCOCCUS AVIUM   Morganella morganii - MIC*    AMPICILLIN >=32 RESISTANT Resistant     CEFTAZIDIME <=1 SENSITIVE Sensitive     CIPROFLOXACIN <=0.25 SENSITIVE Sensitive     GENTAMICIN <=1 SENSITIVE Sensitive     IMIPENEM 1 SENSITIVE Sensitive     TRIMETH /SULFA  <=20 SENSITIVE Sensitive     AMPICILLIN/SULBACTAM >=32 RESISTANT Resistant     PIP/TAZO <=4 SENSITIVE Sensitive ug/mL    * MORGANELLA MORGANII  Blood Culture ID Panel (Reflexed)     Status: Abnormal   Collection Time: 04/21/24 12:02 PM  Result Value Ref Range Status   Enterococcus faecalis NOT DETECTED NOT DETECTED Final   Enterococcus Faecium NOT DETECTED NOT DETECTED Final   Listeria monocytogenes NOT DETECTED NOT DETECTED Final   Staphylococcus species DETECTED (A) NOT DETECTED Final    Comment: CRITICAL RESULT CALLED TO, READ BACK BY AND VERIFIED WITH: J WYLAND,PHARMD@[id number]  04/22/24 MK    Staphylococcus aureus (BCID) NOT DETECTED NOT DETECTED Final   Staphylococcus epidermidis DETECTED (A) NOT DETECTED Final    Comment: CRITICAL RESULT CALLED TO, READ BACK BY AND VERIFIED WITH: J [facility]  04/22/24 MK    Staphylococcus lugdunensis NOT DETECTED NOT DETECTED Final   Streptococcus species NOT DETECTED NOT DETECTED Final   Streptococcus agalactiae NOT DETECTED NOT DETECTED Final   Streptococcus pneumoniae NOT DETECTED NOT DETECTED Final   Streptococcus pyogenes NOT DETECTED NOT DETECTED Final   A.calcoaceticus-baumannii NOT DETECTED NOT DETECTED Final   Bacteroides fragilis NOT DETECTED NOT DETECTED Final   Enterobacterales DETECTED (A) NOT DETECTED Final    Comment: Enterobacterales represent a large order of gram negative bacteria, not a single organism. CRITICAL RESULT CALLED TO, READ BACK BY AND VERIFIED WITH: J [facility]  04/22/24 MK    Enterobacter cloacae  complex NOT DETECTED NOT DETECTED Final   Escherichia coli DETECTED (A) NOT DETECTED  Final    Comment: CRITICAL RESULT CALLED TO, READ BACK BY AND VERIFIED WITH: J WYLAND,PHARMD@[id number]  04/22/24 MK    Klebsiella aerogenes NOT DETECTED NOT DETECTED Final   Klebsiella oxytoca NOT DETECTED NOT DETECTED Final   Klebsiella pneumoniae NOT DETECTED NOT DETECTED Final   Proteus species NOT DETECTED NOT DETECTED Final   Salmonella species NOT DETECTED NOT DETECTED Final   Serratia marcescens NOT DETECTED NOT DETECTED Final   Haemophilus influenzae NOT DETECTED NOT DETECTED Final   Neisseria meningitidis NOT DETECTED NOT DETECTED Final   Pseudomonas aeruginosa NOT DETECTED NOT DETECTED Final   Stenotrophomonas maltophilia NOT DETECTED NOT DETECTED Final   Candida albicans NOT DETECTED NOT DETECTED Final   Candida auris NOT DETECTED NOT DETECTED Final   Candida glabrata NOT DETECTED NOT DETECTED Final   Candida krusei NOT DETECTED NOT DETECTED Final   Candida parapsilosis NOT DETECTED NOT DETECTED Final   Candida tropicalis NOT DETECTED NOT DETECTED Final   Cryptococcus neoformans/gattii NOT DETECTED NOT DETECTED Final   CTX-M ESBL NOT DETECTED NOT DETECTED Final   Carbapenem resistance IMP NOT DETECTED NOT DETECTED Final   Carbapenem resistance KPC NOT DETECTED NOT DETECTED Final   Methicillin resistance mecA/C NOT DETECTED NOT DETECTED Final   Carbapenem resistance NDM NOT DETECTED NOT DETECTED Final   Carbapenem resist OXA 48 LIKE NOT DETECTED NOT DETECTED Final   Carbapenem resistance VIM NOT DETECTED NOT DETECTED Final    Comment: Performed at [facility], [address] [place], [place] [address]  Culture, blood (Routine X 2) w Reflex to ID Panel     Status: None   Collection Time: 04/23/24  7:55 AM   Specimen: BLOOD RIGHT ARM  Result Value Ref Range Status   Specimen Description BLOOD RIGHT ARM  Final   Special Requests   Final    BOTTLES DRAWN AEROBIC AND ANAEROBIC Blood Culture  results may not be optimal due to an inadequate volume of blood received in culture bottles   Culture   Final    NO GROWTH 5 DAYS Performed at [facility], [address], [place], [place] [address]    Report Status 04/28/2024 FINAL  Final  Culture, blood (Routine X 2) w Reflex to ID Panel     Status: None   Collection Time: 04/23/24  7:57 AM   Specimen: BLOOD LEFT HAND  Result Value Ref Range Status   Specimen Description BLOOD LEFT HAND  Final   Special Requests   Final    BOTTLES DRAWN AEROBIC AND ANAEROBIC Blood Culture adequate volume   Culture   Final    NO GROWTH 5 DAYS Performed at [facility], [address] [place], [place] [address]    Report Status 04/28/2024 FINAL  Final     Tony Silva, NP [facility]  04/28/2024  1:56 PM

## 2024-04-28 NOTE — Anesthesia Procedure Notes (Signed)
 Procedure Name: Intubation Date/Time: 04/28/2024 7:47 AM  Performed by: Loreda Rodriguez, CRNAPre-anesthesia Checklist: Patient identified, Emergency Drugs available, Suction available and Patient being monitored Patient Re-evaluated:Patient Re-evaluated prior to induction Oxygen Delivery Method: Circle System Utilized Preoxygenation: Pre-oxygenation with 100% oxygen Induction Type: IV induction Ventilation: Mask ventilation without difficulty Laryngoscope Size: Mac and 3 Grade View: Grade I Tube type: Oral Tube size: 7.0 mm Number of attempts: 1 Airway Equipment and Method: Stylet and Oral airway Placement Confirmation: ETT inserted through vocal cords under direct vision, positive ETCO2 and breath sounds checked- equal and bilateral Secured at: 22 cm Tube secured with: Tape Dental Injury: Teeth and Oropharynx as per pre-operative assessment

## 2024-04-28 NOTE — [place] FL2 (Signed)
 [place]  MEDICAID FL2 LEVEL OF CARE FORM     IDENTIFICATION  Patient Name: Tony Lopez Birthdate: 10-Aug-1951 Sex: male Admission Date (Current Location): 04/18/2024  [facility] and [place] Number:  [employer] and Address:  [facility], [address], [place], [place] [address]      Provider Number: [id number]  Attending Physician Name and Address:  Cala Castleman, MD  Relative Name and Phone Number:       Current Level of Care: Hospital Recommended Level of Care: Skilled Nursing Facility Prior Approval Number:    Date Approved/Denied:   PASRR Number: [id number]  Discharge Plan: SNF    Current Diagnoses: Patient Active Problem List   Diagnosis Date Noted   Acute cholecystitis with chronic cholecystitis 04/26/2024   Empyema of gallbladder 04/26/2024   Alcohol withdrawal (HCC) 04/18/2024   Alcoholic hepatitis 04/18/2024   Hyponatremia 04/18/2024   AKI (acute kidney injury) (HCC) 04/18/2024   GI bleed 04/18/2024   Bile leak 04/18/2024   Hepatitis C antibody positive in blood 02/26/2018   Prostate cancer (HCC) 10/05/2017   Prostatic adenocarcinoma (HCC) 04/30/2017    Orientation RESPIRATION BLADDER Height & Weight     Self, Situation, Time, Place  Normal Incontinent, External catheter Weight: 194 lb 10.7 oz (88.3 kg) Height:  6' (182.9 cm)  BEHAVIORAL SYMPTOMS/MOOD NEUROLOGICAL BOWEL NUTRITION STATUS      Continent Diet (See dc summary)  AMBULATORY STATUS COMMUNICATION OF NEEDS Skin   Limited Assist Verbally Surgical wounds, Other (Comment) (closed incision on abdomen; JP drain)                       Personal Care Assistance Level of Assistance  Bathing, Feeding, Dressing Bathing Assistance: Maximum assistance Feeding assistance: Independent Dressing Assistance: Limited assistance     Functional Limitations Info             SPECIAL CARE FACTORS FREQUENCY  PT (By licensed PT), OT (By licensed OT)      PT Frequency: 5x/week OT Frequency: 5x/week            Contractures Contractures Info: Not present    Additional Factors Info  Code Status, Allergies Code Status Info: Full Allergies Info: NKA           Current Medications (04/28/2024):  This is the current hospital active medication list Current Facility-Administered Medications  Medication Dose Route Frequency Provider Last Rate Last Admin   acetaminophen  (TYLENOL ) tablet 500 mg  500 mg Oral TID WC & HS Candyce Champagne, MD   500 mg at 04/28/24 1157   acetaminophen  (TYLENOL ) tablet 650 mg  650 mg Oral Q6H PRN Burton Casey, MD   650 mg at 04/24/24 1302   allopurinol  (ZYLOPRIM ) tablet 100 mg  100 mg Oral Daily [name], MD   100 mg at 04/28/24 1157   alum & mag hydroxide-simeth (MAALOX/MYLANTA) 200-200-20 MG/5ML suspension 30 mL  30 mL Oral Q6H PRN Candyce Champagne, MD       bisacodyl (DULCOLAX) suppository 10 mg  10 mg Rectal Q12H PRN Candyce Champagne, MD       diphenhydrAMINE  (BENADRYL ) injection 12.5-25 mg  12.5-25 mg Intravenous Q6H PRN Candyce Champagne, MD       feeding supplement (ENSURE PLUS HIGH PROTEIN) liquid 237 mL  237 mL Oral BID BM Ghimire, Estil Heman, MD   237 mL at 04/28/24 1155   folic acid  (FOLVITE ) tablet 1 mg  1 mg Oral  Daily Chatterjee, Srobona Tublu, MD   1 mg at 04/28/24 1157   HYDROmorphone  (DILAUDID ) injection 0.5-2 mg  0.5-2 mg Intravenous Q2H PRN Candyce Champagne, MD   1 mg at 04/27/24 0059   ipratropium (ATROVENT ) nebulizer solution 0.5 mg  0.5 mg Nebulization BID Ghimire, Shanker M, MD   0.5 mg at 04/27/24 2003   levalbuterol  (XOPENEX ) nebulizer solution 0.63 mg  0.63 mg Nebulization BID Burton Casey, MD   0.63 mg at 04/27/24 2003   lidocaine  (LIDODERM ) 5 % 1 patch  1 patch Transdermal Q24H Chatterjee, Srobona Tublu, MD   1 patch at 04/28/24 1155   loperamide  (IMODIUM ) capsule 2 mg  2 mg Oral PRN Burton Casey, MD       magic mouthwash  15 mL Oral QID PRN Candyce Champagne, MD        menthol-cetylpyridinium (CEPACOL) lozenge 3 mg  1 lozenge Oral PRN Candyce Champagne, MD       methocarbamol (ROBAXIN) injection 1,000 mg  1,000 mg Intravenous Q6H PRN Adaline Ada, RPH       Or   methocarbamol (ROBAXIN) tablet 1,000 mg  1,000 mg Oral Q6H PRN Adaline Ada, RPH   1,000 mg at 04/28/24 1156   metoprolol  tartrate (LOPRESSOR ) tablet 25 mg  25 mg Oral BID Ghimire, Shanker M, MD   25 mg at 04/28/24 1156   multivitamin with minerals tablet 1 tablet  1 tablet Oral Daily Chatterjee, Srobona Tublu, MD   1 tablet at 04/28/24 1157   naphazoline-glycerin (CLEAR EYES REDNESS) ophth solution 1-2 drop  1-2 drop Both Eyes QID PRN Candyce Champagne, MD       nicotine  (NICODERM CQ  - dosed in mg/24 hours) patch 21 mg  21 mg Transdermal Daily Ghimire, Shanker M, MD   21 mg at 04/28/24 1158   ondansetron  (ZOFRAN ) tablet 4 mg  4 mg Oral Q6H PRN [name], MD       Or   ondansetron  (ZOFRAN ) injection 4 mg  4 mg Intravenous Q6H PRN [name], MD       oxyCODONE  (Oxy IR/ROXICODONE ) immediate release tablet 5-10 mg  5-10 mg Oral Q4H PRN Candyce Champagne, MD       pantoprazole  (PROTONIX ) injection 40 mg  40 mg Intravenous Q12H Sudie Ely L, MD   40 mg at 04/28/24 1159   phenol (CHLORASEPTIC) mouth spray 2 spray  2 spray Mouth/Throat PRN Candyce Champagne, MD       piperacillin-tazobactam (ZOSYN) IVPB 3.375 g  3.375 g Intravenous Q8H Snider, Cynthia, MD       polycarbophil (FIBERCON) tablet 625 mg  625 mg Oral BID Candyce Champagne, MD   625 mg at 04/28/24 1157   potassium PHOSPHATE 30 mmol in dextrose  5 % 500 mL infusion  30 mmol Intravenous Once Singh, Prashant K, MD       prochlorperazine (COMPAZINE) injection 5-10 mg  5-10 mg Intravenous Q4H PRN Candyce Champagne, MD       simethicone (MYLICON) 40 MG/0.6ML suspension 80 mg  80 mg Oral QID PRN Candyce Champagne, MD       sodium chloride  (OCEAN) 0.65 % nasal spray 1-2 spray  1-2 spray Each Nare Q6H PRN Candyce Champagne, MD       thiamine  (VITAMIN B1) tablet 100 mg   100 mg Oral Daily Chatterjee, Srobona Tublu, MD   100 mg at 04/28/24 1157     Discharge Medications: Please see discharge summary for a list of discharge medications.  Relevant Imaging  Results:  Relevant Lab Results:   Additional Information SSN: [id number] [place], [place]

## 2024-04-29 ENCOUNTER — Encounter (HOSPITAL_COMMUNITY): Payer: Self-pay | Admitting: Gastroenterology

## 2024-04-29 DIAGNOSIS — R7881 Bacteremia: Secondary | ICD-10-CM | POA: Diagnosis not present

## 2024-04-29 DIAGNOSIS — K810 Acute cholecystitis: Secondary | ICD-10-CM | POA: Diagnosis not present

## 2024-04-29 DIAGNOSIS — K8000 Calculus of gallbladder with acute cholecystitis without obstruction: Secondary | ICD-10-CM | POA: Diagnosis not present

## 2024-04-29 LAB — COMPREHENSIVE METABOLIC PANEL WITH GFR
ALT: 30 U/L (ref 0–44)
AST: 40 U/L (ref 15–41)
Albumin: 1.8 g/dL — ABNORMAL LOW (ref 3.5–5.0)
Alkaline Phosphatase: 126 U/L (ref 38–126)
Anion gap: 5 (ref 5–15)
BUN: 7 mg/dL — ABNORMAL LOW (ref 8–23)
CO2: 25 mmol/L (ref 22–32)
Calcium: 7.5 mg/dL — ABNORMAL LOW (ref 8.9–10.3)
Chloride: 107 mmol/L (ref 98–111)
Creatinine, Ser: 0.82 mg/dL (ref 0.61–1.24)
GFR, Estimated: >60 mL/min (ref >60)
Glucose, Bld: 98 mg/dL (ref 70–99)
Potassium: 4.3 mmol/L (ref 3.5–5.1)
Sodium: 137 mmol/L (ref 135–145)
Total Bilirubin: 1.3 mg/dL — ABNORMAL HIGH (ref 0.0–1.2)
Total Protein: 5.4 g/dL — ABNORMAL LOW (ref 6.5–8.1)

## 2024-04-29 LAB — CBC WITH DIFFERENTIAL/PLATELET
Abs Immature Granulocytes: 0.3 10*3/uL — ABNORMAL HIGH (ref 0.00–0.07)
Basophils Absolute: 0 10*3/uL (ref 0.0–0.1)
Basophils Relative: 0 %
Eosinophils Absolute: 0.1 10*3/uL (ref 0.0–0.5)
Eosinophils Relative: 1 %
HCT: 25.3 % — ABNORMAL LOW (ref 39.0–52.0)
Hemoglobin: 8.2 g/dL — ABNORMAL LOW (ref 13.0–17.0)
Immature Granulocytes: 3 %
Lymphocytes Relative: 12 %
Lymphs Abs: 1.4 10*3/uL (ref 0.7–4.0)
MCH: 32.5 pg (ref 26.0–34.0)
MCHC: 32.4 g/dL (ref 30.0–36.0)
MCV: 100.4 fL — ABNORMAL HIGH (ref 80.0–100.0)
Monocytes Absolute: 0.6 10*3/uL (ref 0.1–1.0)
Monocytes Relative: 5 %
Neutro Abs: 9.8 10*3/uL — ABNORMAL HIGH (ref 1.7–7.7)
Neutrophils Relative %: 79 %
Platelets: 479 10*3/uL — ABNORMAL HIGH (ref 150–400)
RBC: 2.52 MIL/uL — ABNORMAL LOW (ref 4.22–5.81)
RDW: 16.9 % — ABNORMAL HIGH (ref 11.5–15.5)
WBC: 12.2 10*3/uL — ABNORMAL HIGH (ref 4.0–10.5)
nRBC: 0 % (ref 0.0–0.2)

## 2024-04-29 LAB — BRAIN NATRIURETIC PEPTIDE: B Natriuretic Peptide: 483.1 pg/mL — ABNORMAL HIGH (ref 0.0–100.0)

## 2024-04-29 LAB — MAGNESIUM: Magnesium: 2 mg/dL (ref 1.7–2.4)

## 2024-04-29 LAB — PHOSPHORUS: Phosphorus: 3.5 mg/dL (ref 2.5–4.6)

## 2024-04-29 MED ORDER — DIPHENHYDRAMINE HCL 12.5 MG/5ML PO ELIX: 12.5000 mg | ORAL_SOLUTION | Freq: Four times a day (QID) | ORAL | Status: DC | PRN

## 2024-04-29 MED ORDER — PANTOPRAZOLE SODIUM 40 MG PO TBEC
40.0000 mg | DELAYED_RELEASE_TABLET | Freq: Two times a day (BID) | ORAL | Status: DC
  Administered 2024-04-29 – 2024-05-02 (×6): 40 mg via ORAL
  Filled 2024-04-29 (×6): qty 1

## 2024-04-29 NOTE — Progress Notes (Signed)
 Physical Therapy Treatment Patient Details Name: Tony Lopez MRN: [id number] DOB: [birth year] Today's Date: 04/29/2024   History of Present Illness Pt is a 73 y/o male presenting on 5/23 with ETOH withdrawal with DTs.  Complicated by tachycardia, hyponatermia, and AKI. Polymicrobial bacteremia 5/26. S/p  laparoscopic subtotal cholecystectomy with drain placement 5/30. S/p ERCP with stent 6/2. PMH includes: arthritis, Hep C, HTN, prostate cancer.    PT Comments  Progressing well towards goals. Eager to get out of bed and mobilize. Up to min assist +2 for safety and balance with transfers and gait for short distance in room x2. Educated on safe AD use with RW. Reviewed LE exercises. Encouraged OOB frequently with staff. Patient will continue to benefit from skilled physical therapy services to further improve independence with functional mobility. Patient will benefit from continued inpatient follow up therapy, <3 hours/day     If plan is discharge home, recommend the following: Assistance with cooking/housework;Assist for transportation;Help with stairs or ramp for entrance;Direct supervision/assist for financial management;Direct supervision/assist for medications management;Supervision due to cognitive status;A lot of help with walking and/or transfers;A little help with bathing/dressing/bathroom   Can travel by private vehicle     Yes  Equipment Recommendations  Hospital bed;Wheelchair cushion (measurements PT);Wheelchair (measurements PT);BSC/3in1    Recommendations for Other Services       Precautions / Restrictions Precautions Precautions: Fall Recall of Precautions/Restrictions: Impaired Precaution/Restrictions Comments: Rt JP drain Restrictions Weight Bearing Restrictions Per Provider Order: No     Mobility  Bed Mobility Overal bed mobility: Needs Assistance Bed Mobility: Supine to Sit Rolling: Min assist   Supine to sit: HOB elevated, Used rails, Contact guard      General bed mobility comments: Min assist to roll and reach for rail, cues for sequencing. CGA to rise to EOB. Slow and effortful.    Transfers Overall transfer level: Needs assistance Equipment used: Rolling walker (2 wheels) Transfers: Sit to/from Stand, Bed to chair/wheelchair/BSC Sit to Stand: Min assist, +2 safety/equipment           General transfer comment: Min assist + 2 for safety/equipment and boost to stand. Steady with RW for support.    Ambulation/Gait Ambulation/Gait assistance: Min assist, +2 safety/equipment Gait Distance (Feet): 10 Feet (x2) Assistive device: Rolling walker (2 wheels) Gait Pattern/deviations: Step-through pattern, Decreased stride length, Staggering right, Leaning posteriorly Gait velocity: dec Gait velocity interpretation: <1.31 ft/sec, indicative of household ambulator Pre-gait activities: static march General Gait Details: Min assist for RW control, balance, with one episode of LOB towards Rt and posterior. Fatigues quickly. Second round without overt LOB. Slow and guarded. Educated on safe AD use with RW.   Stairs             Wheelchair Mobility     Tilt Bed    Modified Rankin (Stroke Patients Only)       Balance Overall balance assessment: Needs assistance Sitting-balance support: No upper extremity supported, Feet supported, Single extremity supported Sitting balance-Leahy Scale: Fair Sitting balance - Comments: supervision for safety EOB no support. Postural control: Posterior lean Standing balance support: Bilateral upper extremity supported, Reliant on assistive device for balance Standing balance-Leahy Scale: Poor Standing balance comment: Requires external support                            Communication Communication Communication: No apparent difficulties  Cognition Arousal: Alert Behavior During Therapy: WFL for tasks assessed/performed   PT - Cognitive impairments: No  family/caregiver present  to determine baseline, Sequencing, Awareness, Problem solving, Safety/Judgement                         Following commands: Intact Following commands impaired: Follows multi-step commands with increased time, Follows multi-step commands inconsistently    Cueing Cueing Techniques: Verbal cues, Gestural cues, Tactile cues  Exercises General Exercises - Lower Extremity Ankle Circles/Pumps: AROM, Both, 10 reps, Seated Quad Sets: Strengthening, Both, 10 reps, Seated Gluteal Sets: Strengthening, Both, 10 reps, Seated Long Arc Quad: Strengthening, Both, 10 reps, Seated    General Comments General comments (skin integrity, edema, etc.): VSS      Pertinent Vitals/Pain Pain Assessment Pain Assessment: Faces Faces Pain Scale: Hurts little more Pain Location: abdomen when coughing Pain Descriptors / Indicators: Operative site guarding, Sore Pain Intervention(s): Monitored during session, Repositioned    Home Living                          Prior Function            PT Goals (current goals can now be found in the care plan section) Acute Rehab PT Goals Patient Stated Goal: Get well PT Goal Formulation: With patient/family Time For Goal Achievement: 05/05/24 Potential to Achieve Goals: Good Progress towards PT goals: Progressing toward goals    Frequency    Min 2X/week      PT Plan      Co-evaluation              AM-PAC PT "6 Clicks" Mobility   Outcome Measure  Help needed turning from your back to your side while in a flat bed without using bedrails?: A Little Help needed moving from lying on your back to sitting on the side of a flat bed without using bedrails?: A Little Help needed moving to and from a bed to a chair (including a wheelchair)?: A Little Help needed standing up from a chair using your arms (e.g., wheelchair or bedside chair)?: A Little Help needed to walk in hospital room?: A Lot Help needed climbing 3-5 steps with a railing?  : Total 6 Click Score: 15    End of Session Equipment Utilized During Treatment: Gait belt Activity Tolerance: Patient tolerated treatment well Patient left: with call bell/phone within reach;in chair;with chair alarm set;with SCD's reapplied Nurse Communication: Mobility status PT Visit Diagnosis: Muscle weakness (generalized) (M62.81);Other abnormalities of gait and mobility (R26.89);Unsteadiness on feet (R26.81);Difficulty in walking, not elsewhere classified (R26.2);Pain Pain - part of body:  (abdomen)     Time: 0454-0981 PT Time Calculation (min) (ACUTE ONLY): 18 min  Charges:    $Therapeutic Activity: 8-22 mins PT General Charges $$ ACUTE PT VISIT: 1 Visit                     Jory Ng, PT, DPT [facility] Services Physical Therapist Office: [phone] Website: [email or web address]    Alinda Irani 04/29/2024, 4:46 PM

## 2024-04-29 NOTE — Progress Notes (Signed)
 PROGRESS NOTE        PATIENT DETAILS Name: Tony Lopez Age: 73 y.o. Sex: male Date of Birth: 1951/03/22 Admit Date: 04/18/2024 Admitting Physician [name], MD ZOX:[id number], [name], [name]  Brief Summary: Patient is a 73 y.o.  male with history of HTN, prostate cancer, GERD, EtOH use-who was admitted for alcohol withdrawal with delirium tremens.  Hospital course complicated by polymicrobial bacteremia and acute calculus cholecystitis.    Significant events: 5/23>> admit to [facility]. 5/26>>Fever-blood culture- gm neg rod/gm +ve rod 5/27>> RUQ tender-CCS consulted. 5/28>> HIDA scan +ve  Significant studies: 5/23>> CT head: No acute intracranial abnormality 5/23>> CT abdomen/pelvis: No acute abnormality. 5/24>> RUQ ultrasound: Biliary sludge/small gallstones.  No evidence of acute cholecystitis. 5/27>> RUQ ultrasound: Distended gallbladder with cholelithiasis/sludge. 5/28>> HIDA scan: Nonvisualization of the gallbladder  Significant microbiology data: 5/25>> GI pathogen panel: Negative 5/26>> blood culture: E. coli/Morganella/gram-positive cocci (BCID staph epi/E. coli-suspect staph epi is a contamination) 5/28>> blood cultures: No growth  Procedures: 5/30>> laparoscopic subtotal cholecystectomy with drain placement. ERCP by [facility] on 04/28/2024 ERCP with 7 French x 7 cm plastic CBD stent placement has been performed uneventfully. [name] bile leak was noted through cystic duct , sludge and 2 CBD stones were removed  TTE 04/28/24 >>  1. Left ventricular ejection fraction, by estimation, is 60 to 65%. The left ventricle has normal function. The left ventricle has no regional wall motion abnormalities. Left ventricular diastolic parameters were normal.   2. Right ventricular systolic function was not well visualized. The right ventricular size is mildly enlarged. RV systolic function appears to be normal.   3. The mitral valve is grossly normal. No evidence  of mitral valve regurgitation. No evidence of mitral stenosis.   4. The aortic valve is grossly normal. Aortic valve regurgitation is not visualized. No aortic stenosis is present.   5. The inferior vena cava is dilated in size with <50% respiratory variability, suggesting right atrial pressure of 15 mmHg.   6. Valves not well visualized, consider TEE if clinically indicated for endocarditis.   Consults: GI CCS ID  Subjective:   Patient in bed, appears comfortable, denies any headache, no fever, no chest pain or pressure, no shortness of breath , mild right upper quadrant drain site abdominal pain. No new focal weakness.   Objective: Vitals: Blood pressure (!) 107/53, pulse (!) 56, temperature 98.3 F (36.8 C), temperature source Oral, resp. rate 12, height 6' (1.829 m), weight 88.3 kg, SpO2 97%.   Exam:  Awake Alert, No new F.N deficits, Normal affect [place].AT,PERRAL Supple Neck, No JVD,   Symmetrical Chest wall movement, Good air movement bilaterally, CTAB RRR,No Gallops, Rubs or new Murmurs,  +ve B.Sounds, Abd Soft, mild RUQ tenderness, gallbladder drain in place No Cyanosis, Clubbing or edema    Assessment/Plan:  EtOH withdrawal with DTs Much improved after supportive care, DTs seem to have now resolved.  Counseled to abstain from alcohol use.  Sinus tachycardia with PACs Secondary to DTs Tachycardia has essentially resolved-with treatment of underlying DTs-and with initiation of low-dose beta-blockers.  Polymicrobial bacteremia (E. coli/Morganella/Enterococcus) E acute calculus cholecystitis, choledocholithiasis S/p subtotal laparoscopic cholecystectomy 5/13 ID following, general surgery and GI following, currently on combination of Unasyn and Cipro Right upper quadrant ultrasound noted with cholelithiasis and sludge, subsequent HIDA scan positive, GI general surgery on board, underwent ERCP by [facility] on 04/28/2024 - 7 French x 7 cm plastic CBD stent placement has been  performed uneventfully. [name] bile leak was noted through cystic duct , sludge and 2 CBD stones were removed. Stable echocardiogram.   Elevated lipase level Initially not felt to have abdominal pain-no evidence of pancreatitis on CT GI on board underwent ERCP suggesting CBD stones which were removed on 04/28/2024 by [facility].  EtOH hepatitis Transaminases have essentially normalized with supportive care Per GI note-no indication for steroid as discriminant function I<10.  AKI Hemodynamically mediated-poor oral intake/use of lisinopril /HCTZ Resolved with supportive care.  Hyponatremia Secondary to dehydration/poor oral intake/HCTZ use Resolved.  Hypomagnesemia/hypophosphatemia Replete/recheck.  Diarrhea Prn immodium GI pathogen panel negative Thankfully diarrhea has resolved.  Thrombocytopenia Secondary to EtOH use Resolved.  Normocytic anemia Probably related to EtOH related bone marrow suppression, trace Hemoccult positive stool, no signs of brisk bleeding, some drop due to heme dilution and perioperative blood loss.  Getting 1 unit of packed RBC on 04/27/2024.  On PPI continue to monitor.  Posttransfusion H&H on 04/28/2024 stable.  Hypophosphatemia.  Replace.   Guaiac positive stool Outpatient GI follow-up-as no evidence of active bleeding.  PPI.  1.6 x 2.4 cm structure arising from left kidney Incidental finding on CT abdomen. Per radiology-favored to represent proteinaceous/hemorrhagic cyst Will need outpatient imaging postdischarge.  Tobacco abuse Transdermal nicotine  Some wheezing today-longstanding history-unclear if he has underlying COPD-starting bronchodilators and seeing how he does.  Debility/deconditioning Secondary to acute illness PT OT eval-SNF recommended  BMI: Estimated body mass index is 26.4 kg/m as calculated from the following:   Height as of this encounter: 6' (1.829 m).   Weight as of this encounter: 88.3 kg.   Code status:   Code Status:  Full Code   DVT Prophylaxis: SCDs Start: 04/18/24 2024   Family Communication: Ex wife-Nikki Gascoigne [phone] updated over the phone 5/31   Disposition Plan: Status is: Inpatient Remains inpatient appropriate because: Severity of illness   Planned Discharge Destination: SNF   Diet: Diet Order             DIET SOFT Fluid consistency: Thin  Diet effective now                   MEDICATIONS: Scheduled Meds:  acetaminophen   500 mg Oral TID WC & HS   allopurinol   100 mg Oral Daily   feeding supplement  237 mL Oral BID BM   folic acid   1 mg Oral Daily   ipratropium  0.5 mg Nebulization BID   levalbuterol   0.63 mg Nebulization BID   lidocaine   1 patch Transdermal Q24H   metoprolol  tartrate  25 mg Oral BID   multivitamin with minerals  1 tablet Oral Daily   nicotine   21 mg Transdermal Daily   pantoprazole  (PROTONIX ) IV  40 mg Intravenous Q12H   polycarbophil  625 mg Oral BID   thiamine   100 mg Oral Daily   Continuous Infusions:  piperacillin-tazobactam (ZOSYN)  IV 3.375 g (04/29/24 0515)   PRN Meds:.acetaminophen , alum & mag hydroxide-simeth, bisacodyl, diphenhydrAMINE , HYDROmorphone  (DILAUDID ) injection, loperamide , magic mouthwash, menthol-cetylpyridinium, methocarbamol (ROBAXIN) injection **OR** methocarbamol, naphazoline-glycerin, ondansetron  **OR** ondansetron  (ZOFRAN ) IV, oxyCODONE , phenol, prochlorperazine, simethicone, sodium chloride    I have personally reviewed following labs and imaging studies  LABORATORY DATA:  Data Review:   Inpatient Medications  Scheduled Meds:  acetaminophen   500 mg Oral TID WC & HS   allopurinol   100 mg Oral Daily   feeding supplement  237 mL  Oral BID BM   folic acid   1 mg Oral Daily   ipratropium  0.5 mg Nebulization BID   levalbuterol   0.63 mg Nebulization BID   lidocaine   1 patch Transdermal Q24H   metoprolol  tartrate  25 mg Oral BID   multivitamin with minerals  1 tablet Oral Daily   nicotine   21 mg Transdermal Daily    pantoprazole  (PROTONIX ) IV  40 mg Intravenous Q12H   polycarbophil  625 mg Oral BID   thiamine   100 mg Oral Daily   Continuous Infusions:  piperacillin-tazobactam (ZOSYN)  IV 3.375 g (04/29/24 0515)   PRN Meds:.acetaminophen , alum & mag hydroxide-simeth, bisacodyl, diphenhydrAMINE , HYDROmorphone  (DILAUDID ) injection, loperamide , magic mouthwash, menthol-cetylpyridinium, methocarbamol (ROBAXIN) injection **OR** methocarbamol, naphazoline-glycerin, ondansetron  **OR** ondansetron  (ZOFRAN ) IV, oxyCODONE , phenol, prochlorperazine, simethicone, sodium chloride   DVT Prophylaxis  SCDs Start: 04/18/24 2024  Recent Labs  Lab 04/26/24 0811 04/27/24 0307 04/27/24 1414 04/28/24 0342 04/29/24 0328  WBC 15.1* 13.4* 13.3* 9.8 12.2*  HGB 7.0* 6.8* 8.4* 7.8* 8.2*  HCT 20.7* 20.8* 25.1* 23.5* 25.3*  PLT 260 325 386 414* 479*  MCV 101.5* 102.5* 98.8 97.1 100.4*  MCH 34.3* 33.5 33.1 32.2 32.5  MCHC 33.8 32.7 33.5 33.2 32.4  RDW 15.4 15.3 17.1* 16.9* 16.9*  LYMPHSABS  --   --  1.5 1.2 1.4  MONOABS  --   --  0.5 0.3 0.6  EOSABS  --   --  0.0 0.0 0.1  BASOSABS  --   --  0.0 0.0 0.0    Recent Labs  Lab 04/25/24 0610 04/26/24 0811 04/27/24 0307 04/27/24 0817 04/28/24 0342 04/29/24 0328  NA 136 139 137 136 134* 137  K 3.5 3.8 3.4* 3.5 3.5 4.3  CL 105 108 106 105 104 107  CO2 22 23 23 23 24 25   ANIONGAP 9 8 8 8 6 5   GLUCOSE 107* 104* 105* 100* 103* 98  BUN 13 12 12 10 8  7*  CREATININE 0.79 0.76 0.75 0.66 0.69 0.82  AST 38  --  54* 49* 39 40  ALT 31  --  32 34 30 30  ALKPHOS 126  --  108 104 112 126  BILITOT 1.9*  --  1.2 1.5* 1.2 1.3*  ALBUMIN  1.8*  --  1.8* 1.8* 1.8* 1.8*  INR  --   --   --   --  1.1  --   BNP  --   --   --   --  469.6* 483.1*  MG 1.7 2.1  --  2.0 1.9 2.0  PHOS 2.2* 2.7  --  2.1* 2.1* 3.5  CALCIUM 7.7* 7.5* 7.3* 7.4* 7.4* 7.5*      Recent Labs  Lab 04/25/24 0610 04/26/24 0811 04/27/24 0307 04/27/24 0817 04/28/24 0342 04/29/24 0328  INR  --   --   --    --  1.1  --   BNP  --   --   --   --  469.6* 483.1*  MG 1.7 2.1  --  2.0 1.9 2.0  CALCIUM 7.7* 7.5* 7.3* 7.4* 7.4* 7.5*    Radiology Reports  ECHOCARDIOGRAM COMPLETE Result Date: 04/28/2024    ECHOCARDIOGRAM REPORT   Patient Name:   Tony Lopez Date of Exam: 04/28/2024 Medical Rec #:  [id number]          Height:       72.0 in Accession #:    [id number]         Weight:  194.7 lb Date of Birth:  08-30-1951         BSA:          2.106 m Patient Age:    72 years           BP:           122/65 mmHg Patient Gender: M                  HR:           67 bpm. Exam Location:  Inpatient Procedure: 2D Echo, Cardiac Doppler and Color Doppler (Both Spectral and Color            Flow Doppler were utilized during procedure). Indications:    Bacteremia  History:        Patient has no prior history of Echocardiogram examinations.  Sonographer:    Janette Medley Referring Phys: Estil Heman [facility]  Sonographer Comments: Suboptimal apical window and Technically difficult study due to poor echo windows. Image acquisition challenging due to patient body habitus. IMPRESSIONS  1. Left ventricular ejection fraction, by estimation, is 60 to 65%. The left ventricle has normal function. The left ventricle has no regional wall motion abnormalities. Left ventricular diastolic parameters were normal.  2. Right ventricular systolic function was not well visualized. The right ventricular size is mildly enlarged. RV systolic function appears to be normal.  3. The mitral valve is grossly normal. No evidence of mitral valve regurgitation. No evidence of mitral stenosis.  4. The aortic valve is grossly normal. Aortic valve regurgitation is not visualized. No aortic stenosis is present.  5. The inferior vena cava is dilated in size with <50% respiratory variability, suggesting right atrial pressure of 15 mmHg.  6. Valves not well visualized, consider TEE if clinically indicated for endocarditis. FINDINGS  Left Ventricle: Left ventricular  ejection fraction, by estimation, is 60 to 65%. The left ventricle has normal function. The left ventricle has no regional wall motion abnormalities. The left ventricular internal cavity size was normal in size. There is  no left ventricular hypertrophy. Left ventricular diastolic parameters were normal. Right Ventricle: The right ventricular size is mildly enlarged. No increase in right ventricular wall thickness. Right ventricular systolic function was not well visualized. Left Atrium: Left atrial size was normal in size. Right Atrium: Right atrial size was normal in size. Pericardium: There is no evidence of pericardial effusion. Mitral Valve: The mitral valve is grossly normal. No evidence of mitral valve regurgitation. No evidence of mitral valve stenosis. Tricuspid Valve: The tricuspid valve is grossly normal. Tricuspid valve regurgitation is not demonstrated. No evidence of tricuspid stenosis. Aortic Valve: The aortic valve is grossly normal. Aortic valve regurgitation is not visualized. No aortic stenosis is present. Pulmonic Valve: The pulmonic valve was not well visualized. Pulmonic valve regurgitation is not visualized. No evidence of pulmonic stenosis. Aorta: The aortic root is normal in size and structure. Venous: The inferior vena cava is dilated in size with less than 50% respiratory variability, suggesting right atrial pressure of 15 mmHg. IAS/Shunts: No atrial level shunt detected by color flow Doppler.  LEFT VENTRICLE PLAX 2D LVIDd:         4.90 cm   Diastology LVIDs:         3.10 cm   LV e' medial:    7.51 cm/s LV PW:         1.00 cm   LV E/e' medial:  10.7 LV IVS:  1.10 cm   LV e' lateral:   10.40 cm/s LVOT diam:     2.00 cm   LV E/e' lateral: 7.8 LV SV:         45 LV SV Index:   21 LVOT Area:     3.14 cm  RIGHT VENTRICLE             IVC RV S prime:     14.40 cm/s  IVC diam: 2.30 cm TAPSE (M-mode): 2.0 cm LEFT ATRIUM             Index       RIGHT ATRIUM          Index LA diam:        2.70  cm 1.28 cm/m  RA Area:     7.14 cm LA Vol (A2C):   13.7 ml 6.50 ml/m  RA Volume:   13.00 ml 6.17 ml/m LA Vol (A4C):   19.9 ml 9.45 ml/m LA Biplane Vol: 16.6 ml 7.88 ml/m  AORTIC VALVE LVOT Vmax:   65.50 cm/s LVOT Vmean:  42.400 cm/s LVOT VTI:    0.143 m  AORTA Ao Root diam: 3.70 cm MITRAL VALVE MV Area (PHT): 4.06 cm    SHUNTS MV Decel Time: 187 msec    Systemic VTI:  0.14 m MV E velocity: 80.70 cm/s  Systemic Diam: 2.00 cm MV A velocity: 77.00 cm/s MV E/A ratio:  1.05 Aditya Sabharwal Electronically signed by Alwin Baars Signature Date/Time: 04/28/2024/1:33:05 PM    Final    DG ERCP Result Date: 04/28/2024 CLINICAL DATA:  Treatment of bile leak post subtotal cholecystectomy EXAM: ERCP TECHNIQUE: Multiple spot images obtained with the fluoroscopic device and submitted for interpretation post-procedure. FLUOROSCOPY: Radiation Exposure Index (as provided by the fluoroscopic device): 23.84 mGy Kerma COMPARISON:  None Available. FINDINGS: Ten fluoroscopic images of the right upper quadrant performed during endoscopy. Images demonstrate flexible endoscope with guidewire in the common bile duct contrast injection demonstrates filling of the mildly dilated common bile duct with filling defects within the distal duct suggesting choledocholithiasis. Balloon sweep of the common bile duct with subsequent removal of choledocholithiasis. Contrast is seen in the cystic duct and extravasated into the gallbladder fossa. Final image demonstrates interval placement of a plastic stent spanning the common bile duct IMPRESSION: ERCP demonstrating cystic duct leak. Choledocholithiasis treated with balloon sweep. Common bile duct stent placement. These images were submitted for radiologic interpretation only. Please see the procedural report for the amount of contrast and the fluoroscopy time utilized. Electronically Signed   By: Susan Ensign   On: 04/28/2024 12:38      Signature  -   Lynnwood Sauer M.D on 04/29/2024 at  8:53 AM   -  To page go to [email or web address]

## 2024-04-29 NOTE — TOC Progression Note (Signed)
 Transition of Care [facility]) - Progression Note    Patient Details  Name: Tony Lopez MRN: [id number] Date of Birth: Nov 12, 1951  Transition of Care [facility]) CM/SW Contact  Jannice Mends, LCSW Phone Number: 04/29/2024, 4:51 PM  Clinical Narrative:    CSW spoke with patient's ex-wife who confirmed she is the best person in the family to assist patient in choosing SNF as she does in home care. She reported preference for [facility] where she lives and daughter works. CSW emailed her SNF bed offers and ratings list to [email or web address].    Expected Discharge Plan: Skilled Nursing Facility Barriers to Discharge: Continued Medical Work up, English as a second language teacher, SNF Pending bed offer  Expected Discharge Plan and Services In-house Referral: Clinical Social Work   Post Acute Care Choice: Skilled Nursing Facility Living arrangements for the past 2 months: Single Family Home                                       Social Determinants of Health (SDOH) Interventions SDOH Screenings   Food Insecurity: No Food Insecurity (04/19/2024)  Housing: Low Risk  (04/19/2024)  Transportation Needs: No Transportation Needs (04/19/2024)  Utilities: Not At Risk (04/19/2024)  Tobacco Use: High Risk (04/28/2024)    Readmission Risk Interventions     No data to display

## 2024-04-29 NOTE — Plan of Care (Signed)
 progressing

## 2024-04-29 NOTE — Progress Notes (Incomplete)
 po

## 2024-04-29 NOTE — Progress Notes (Signed)
 Progress Note    Subjective: Alert and reports mild RUQ pain. No other complaints. Tolerating diet. Plans for SNF on DC  Objective: Vital signs in last 24 hours: Temp:  [97.3 F (36.3 C)-98.3 F (36.8 C)] 98.3 F (36.8 C) (06/02 2011) Pulse Rate:  [56-76] 56 (06/03 0400) Resp:  [12-20] 12 (06/03 0400) BP: (107-133)/(53-83) 107/53 (06/03 0400) SpO2:  [95 %-99 %] 97 % (06/03 0901) Last BM Date : 04/28/24  Intake/Output from previous day: 06/02 0701 - 06/03 0700 In: 776.6 [I.V.:776.6] Out: 765 [Urine:200; Drains:565] Intake/Output this shift: Total I/O In: -  Out: 960 [Urine:800; Drains:160]  PE: Heart: regular, rate, and rhythm.  Lungs: Respiratory effort nonlabored Abd: soft, mildly TTP in RUQ, incisions C/D/I, drain with thinner less bilious material this AM  Lab Results:  Recent Labs    04/28/24 0342 04/29/24 0328  WBC 9.8 12.2*  HGB 7.8* 8.2*  HCT 23.5* 25.3*  PLT 414* 479*   BMET Recent Labs    04/28/24 0342 04/29/24 0328  NA 134* 137  K 3.5 4.3  CL 104 107  CO2 24 25  GLUCOSE 103* 98  BUN 8 7*  CREATININE 0.69 0.82  CALCIUM 7.4* 7.5*   PT/INR Recent Labs    04/28/24 0342  LABPROT 14.0  INR 1.1   CMP     Component Value Date/Time   NA 137 04/29/2024 0328   K 4.3 04/29/2024 0328   CL 107 04/29/2024 0328   CO2 25 04/29/2024 0328   GLUCOSE 98 04/29/2024 0328   BUN 7 (L) 04/29/2024 0328   CREATININE 0.82 04/29/2024 0328   CALCIUM 7.5 (L) 04/29/2024 0328   PROT 5.4 (L) 04/29/2024 0328   ALBUMIN  1.8 (L) 04/29/2024 0328   AST 40 04/29/2024 0328   ALT 30 04/29/2024 0328   ALKPHOS 126 04/29/2024 0328   BILITOT 1.3 (H) 04/29/2024 0328   GFRNONAA >60 04/29/2024 0328   GFRAA >60 10/06/2017 0501   Lipase     Component Value Date/Time   LIPASE 266 (H) 04/20/2024 0528       Studies/Results: ECHOCARDIOGRAM COMPLETE Result Date: 04/28/2024    ECHOCARDIOGRAM REPORT   Patient Name:   Tony Lopez Date of Exam: 04/28/2024 Medical Rec  #:  [id number]          Height:       72.0 in Accession #:    [id number]         Weight:       194.7 lb Date of Birth:  23-Sep-1951         BSA:          2.106 m Patient Age:    72 years           BP:           122/65 mmHg Patient Gender: M                  HR:           67 bpm. Exam Location:  Inpatient Procedure: 2D Echo, Cardiac Doppler and Color Doppler (Both Spectral and Color            Flow Doppler were utilized during procedure). Indications:    Bacteremia  History:        Patient has no prior history of Echocardiogram examinations.  Sonographer:    Janette Medley Referring Phys: Estil Heman [facility]  Sonographer Comments: Suboptimal apical window and Technically difficult study due to poor echo windows. Image  acquisition challenging due to patient body habitus. IMPRESSIONS  1. Left ventricular ejection fraction, by estimation, is 60 to 65%. The left ventricle has normal function. The left ventricle has no regional wall motion abnormalities. Left ventricular diastolic parameters were normal.  2. Right ventricular systolic function was not well visualized. The right ventricular size is mildly enlarged. RV systolic function appears to be normal.  3. The mitral valve is grossly normal. No evidence of mitral valve regurgitation. No evidence of mitral stenosis.  4. The aortic valve is grossly normal. Aortic valve regurgitation is not visualized. No aortic stenosis is present.  5. The inferior vena cava is dilated in size with <50% respiratory variability, suggesting right atrial pressure of 15 mmHg.  6. Valves not well visualized, consider TEE if clinically indicated for endocarditis. FINDINGS  Left Ventricle: Left ventricular ejection fraction, by estimation, is 60 to 65%. The left ventricle has normal function. The left ventricle has no regional wall motion abnormalities. The left ventricular internal cavity size was normal in size. There is  no left ventricular hypertrophy. Left ventricular diastolic parameters were  normal. Right Ventricle: The right ventricular size is mildly enlarged. No increase in right ventricular wall thickness. Right ventricular systolic function was not well visualized. Left Atrium: Left atrial size was normal in size. Right Atrium: Right atrial size was normal in size. Pericardium: There is no evidence of pericardial effusion. Mitral Valve: The mitral valve is grossly normal. No evidence of mitral valve regurgitation. No evidence of mitral valve stenosis. Tricuspid Valve: The tricuspid valve is grossly normal. Tricuspid valve regurgitation is not demonstrated. No evidence of tricuspid stenosis. Aortic Valve: The aortic valve is grossly normal. Aortic valve regurgitation is not visualized. No aortic stenosis is present. Pulmonic Valve: The pulmonic valve was not well visualized. Pulmonic valve regurgitation is not visualized. No evidence of pulmonic stenosis. Aorta: The aortic root is normal in size and structure. Venous: The inferior vena cava is dilated in size with less than 50% respiratory variability, suggesting right atrial pressure of 15 mmHg. IAS/Shunts: No atrial level shunt detected by color flow Doppler.  LEFT VENTRICLE PLAX 2D LVIDd:         4.90 cm   Diastology LVIDs:         3.10 cm   LV e' medial:    7.51 cm/s LV PW:         1.00 cm   LV E/e' medial:  10.7 LV IVS:        1.10 cm   LV e' lateral:   10.40 cm/s LVOT diam:     2.00 cm   LV E/e' lateral: 7.8 LV SV:         45 LV SV Index:   21 LVOT Area:     3.14 cm  RIGHT VENTRICLE             IVC RV S prime:     14.40 cm/s  IVC diam: 2.30 cm TAPSE (M-mode): 2.0 cm LEFT ATRIUM             Index       RIGHT ATRIUM          Index LA diam:        2.70 cm 1.28 cm/m  RA Area:     7.14 cm LA Vol (A2C):   13.7 ml 6.50 ml/m  RA Volume:   13.00 ml 6.17 ml/m LA Vol (A4C):   19.9 ml 9.45 ml/m LA Biplane Vol: 16.6 ml 7.88  ml/m  AORTIC VALVE LVOT Vmax:   65.50 cm/s LVOT Vmean:  42.400 cm/s LVOT VTI:    0.143 m  AORTA Ao Root diam: 3.70 cm MITRAL  VALVE MV Area (PHT): 4.06 cm    SHUNTS MV Decel Time: 187 msec    Systemic VTI:  0.14 m MV E velocity: 80.70 cm/s  Systemic Diam: 2.00 cm MV A velocity: 77.00 cm/s MV E/A ratio:  1.05 Aditya Sabharwal Electronically signed by Alwin Baars Signature Date/Time: 04/28/2024/1:33:05 PM    Final    DG ERCP Result Date: 04/28/2024 CLINICAL DATA:  Treatment of bile leak post subtotal cholecystectomy EXAM: ERCP TECHNIQUE: Multiple spot images obtained with the fluoroscopic device and submitted for interpretation post-procedure. FLUOROSCOPY: Radiation Exposure Index (as provided by the fluoroscopic device): 23.84 mGy Kerma COMPARISON:  None Available. FINDINGS: Ten fluoroscopic images of the right upper quadrant performed during endoscopy. Images demonstrate flexible endoscope with guidewire in the common bile duct contrast injection demonstrates filling of the mildly dilated common bile duct with filling defects within the distal duct suggesting choledocholithiasis. Balloon sweep of the common bile duct with subsequent removal of choledocholithiasis. Contrast is seen in the cystic duct and extravasated into the gallbladder fossa. Final image demonstrates interval placement of a plastic stent spanning the common bile duct IMPRESSION: ERCP demonstrating cystic duct leak. Choledocholithiasis treated with balloon sweep. Common bile duct stent placement. These images were submitted for radiologic interpretation only. Please see the procedural report for the amount of contrast and the fluoroscopy time utilized. Electronically Signed   By: Susan Ensign   On: 04/28/2024 12:38     Anti-infectives: Anti-infectives (From admission, onward)    Start     Dose/Rate Route Frequency Ordered Stop   04/28/24 1200  piperacillin-tazobactam (ZOSYN) IVPB 3.375 g        3.375 g 12.5 mL/hr over 240 Minutes Intravenous Every 8 hours 04/28/24 0936     04/26/24 1600  Ampicillin-Sulbactam (UNASYN) 3 g in sodium chloride  0.9 % 100 mL  IVPB  Status:  Discontinued        3 g 200 mL/hr over 30 Minutes Intravenous Every 6 hours 04/26/24 1506 04/28/24 0936   04/26/24 1545  ciprofloxacin (CIPRO) IVPB 400 mg  Status:  Discontinued        400 mg 200 mL/hr over 60 Minutes Intravenous Every 12 hours 04/26/24 1456 04/28/24 0936   04/26/24 1400  DAPTOmycin  (CUBICIN ) IVPB 700 mg/100mL premix  Status:  Discontinued        8 mg/kg  88.3 kg 200 mL/hr over 30 Minutes Intravenous Daily 04/25/24 1552 04/26/24 1456   04/26/24 1200  piperacillin-tazobactam (ZOSYN) IVPB 3.375 g  Status:  Discontinued        3.375 g 12.5 mL/hr over 240 Minutes Intravenous Every 8 hours 04/26/24 0941 04/26/24 1456   04/25/24 1345  DAPTOmycin  (CUBICIN ) IVPB 700 mg/198mL premix        8 mg/kg  88.3 kg 200 mL/hr over 30 Minutes Intravenous STAT 04/25/24 1332 04/25/24 1419   04/23/24 1430  ceFEPIme  (MAXIPIME ) 2 g in sodium chloride  0.9 % 100 mL IVPB  Status:  Discontinued        2 g 200 mL/hr over 30 Minutes Intravenous Every 8 hours 04/23/24 1337 04/26/24 0941   04/22/24 0400  cefTRIAXone  (ROCEPHIN ) 2 g in sodium chloride  0.9 % 100 mL IVPB  Status:  Discontinued        2 g 200 mL/hr over 30 Minutes Intravenous Every 24 hours 04/22/24 0333  04/23/24 1337        Assessment/Plan  E. Coli Bacteremia  Acute cholecystitis  POD4 s/p laparoscopic subtotal cholecystectomy Dr. Camilo Cella - postop bile leak - s/p ERCP 6/2 with stent placement and clearance of choledocholithiasis  - continue drain  - output appears less bilious today  - continue abx another 2 days post-op  - diet as tolerated - stable for DC with drain from surgical perspective when medically cleared - arranging follow up in [facility] office in 2 weeks for drain check/removal   FEN: soft diet, ensure VTE: SCDs, ok for DVT prophylaxis when cleared from GI standpoint  ID: Rocephin    - per [facility] -  Acute alcohol withdrawal with DTs AKI, resolved Chronic thrombocytopenia - plts 414 Left renal lesion -  outpatient follow up imaging recommended  Tobacco abuse   LOS: 11 days     Annetta Killian, [facility] Surgery 04/29/2024, 9:37 AM Please see Amion for pager number during day hours 7:00am-4:30pm

## 2024-04-29 NOTE — Progress Notes (Signed)
 [facility]    Date of Admission:  04/18/2024             ID: Tony Lopez is a 73 y.o. male with Principal Problem:   Empyema of gallbladder Active Problems:   Prostatic adenocarcinoma (HCC)   Hepatitis C antibody positive in blood   Alcohol withdrawal (HCC)   Alcoholic hepatitis   Hyponatremia   AKI (acute kidney injury) (HCC)   GI bleed   Acute cholecystitis with chronic cholecystitis   Bile leak    Subjective: Tolerated eggs and bacon this morning without significant abdominal pain.  Medications:   acetaminophen   500 mg Oral TID WC & HS   allopurinol   100 mg Oral Daily   feeding supplement  237 mL Oral BID BM   folic acid   1 mg Oral Daily   ipratropium  0.5 mg Nebulization BID   levalbuterol   0.63 mg Nebulization BID   lidocaine   1 patch Transdermal Q24H   metoprolol  tartrate  25 mg Oral BID   multivitamin with minerals  1 tablet Oral Daily   nicotine   21 mg Transdermal Daily   pantoprazole   40 mg Oral BID   polycarbophil  625 mg Oral BID   thiamine   100 mg Oral Daily    Objective: Vital signs in last 24 hours: Temp:  [97.9 F (36.6 C)-98.3 F (36.8 C)] 98.3 F (36.8 C) (06/03 1639) Pulse Rate:  [56-72] 56 (06/03 0400) Resp:  [12-20] 12 (06/03 0400) BP: (107-126)/(53-71) 118/64 (06/03 1639) SpO2:  [95 %-99 %] 97 % (06/03 0901)  Physical Exam  Constitutional: He is oriented to person, place, and time. He appears well-developed and well-nourished. No distress.  HENT:  Mouth/Throat: Oropharynx is clear and moist. No oropharyngeal exudate.  Cardiovascular: Normal rate, regular rhythm and normal heart sounds. Exam reveals no gallop and no friction rub.  No murmur heard.  Pulmonary/Chest: Effort normal and breath sounds normal. No respiratory distress. He has no wheezes.  Abdominal: Soft. Bowel sounds are normal. He exhibits no distension. There is no tenderness.  Lymphadenopathy:  He has no cervical adenopathy.   Neurological: He is alert and oriented to person, place, and time.  Skin: Skin is warm and dry. No rash noted. No erythema.  Psychiatric: He has a normal mood and affect. His behavior is normal.    Lab Results Recent Labs    04/28/24 0342 04/29/24 0328  WBC 9.8 12.2*  HGB 7.8* 8.2*  HCT 23.5* 25.3*  NA 134* 137  K 3.5 4.3  CL 104 107  CO2 24 25  BUN 8 7*  CREATININE 0.69 0.82   Liver Panel Recent Labs    04/28/24 0342 04/29/24 0328  PROT 5.1* 5.4*  ALBUMIN  1.8* 1.8*  AST 39 40  ALT 30 30  ALKPHOS 112 126  BILITOT 1.2 1.3*    Microbiology: Specimen Description BLOOD RIGHT ARM  Special Requests BOTTLES DRAWN AEROBIC ONLY Blood Culture adequate volume  Culture  Setup Time GRAM NEGATIVE RODS GRAM POSITIVE COCCI AEROBIC BOTTLE ONLY Performed at [facility], [address] [place], [place] [address]  Culture  Abnormal  MORGANELLA MORGANII ESCHERICHIA COLI SUSCEPTIBILITIES PERFORMED ON PREVIOUS CULTURE WITHIN THE LAST 5 DAYS. ENTEROCOCCUS AVIUM   Report Status 04/26/2024 FINAL  Organism ID, Bacteria MORGANELLA MORGANII  Organism ID, Bacteria ENTEROCOCCUS AVIUM  Resulting Agency [facility]     Susceptibility   Morganella morganii Enterococcus avium    MIC MIC  AMPICILLIN >=32 RESIST... Resistant <=2 SENSITIVE Sensitive    AMPICILLIN/SULBACTAM >=32 RESIST... Resistant      CEFTAZIDIME <=1 SENSITIVE Sensitive      CIPROFLOXACIN <=0.25 SENS... Sensitive      GENTAMICIN <=1 SENSITIVE Sensitive      GENTAMICIN SYNERGY   SENSITIVE Sensitive    IMIPENEM 1 SENSITIVE Sensitive      PIP/TAZO <=4 SENSITI... Sensitive      TRIMETH /SULFA  <=20 SENSIT... Sensitive      VANCOMYCIN   <=0.5 SENSI... Sensitive      Studies/Results: ECHOCARDIOGRAM COMPLETE Result Date: 04/28/2024    ECHOCARDIOGRAM REPORT   Patient Name:   Tony Lopez Date of Exam: 04/28/2024 Medical Rec #:  [id number]          Height:       72.0 in Accession #:    [id number]         Weight:        194.7 lb Date of Birth:  04/08/1951         BSA:          2.106 m Patient Age:    72 years           BP:           122/65 mmHg Patient Gender: M                  HR:           67 bpm. Exam Location:  Inpatient Procedure: 2D Echo, Cardiac Doppler and Color Doppler (Both Spectral and Color            Flow Doppler were utilized during procedure). Indications:    Bacteremia  History:        Patient has no prior history of Echocardiogram examinations.  Sonographer:    Janette Medley Referring Phys: Estil Heman [facility]  Sonographer Comments: Suboptimal apical window and Technically difficult study due to poor echo windows. Image acquisition challenging due to patient body habitus. IMPRESSIONS  1. Left ventricular ejection fraction, by estimation, is 60 to 65%. The left ventricle has normal function. The left ventricle has no regional wall motion abnormalities. Left ventricular diastolic parameters were normal.  2. Right ventricular systolic function was not well visualized. The right ventricular size is mildly enlarged. RV systolic function appears to be normal.  3. The mitral valve is grossly normal. No evidence of mitral valve regurgitation. No evidence of mitral stenosis.  4. The aortic valve is grossly normal. Aortic valve regurgitation is not visualized. No aortic stenosis is present.  5. The inferior vena cava is dilated in size with <50% respiratory variability, suggesting right atrial pressure of 15 mmHg.  6. Valves not well visualized, consider TEE if clinically indicated for endocarditis. FINDINGS  Left Ventricle: Left ventricular ejection fraction, by estimation, is 60 to 65%. The left ventricle has normal function. The left ventricle has no regional wall motion abnormalities. The left ventricular internal cavity size was normal in size. There is  no left ventricular hypertrophy. Left ventricular diastolic parameters were normal. Right Ventricle: The right ventricular size is mildly enlarged. No increase in  right ventricular wall thickness. Right ventricular systolic function was not well visualized. Left Atrium: Left atrial size was normal in size. Right Atrium: Right atrial size was normal in size. Pericardium: There is no evidence of pericardial effusion. Mitral Valve: The mitral valve is grossly normal. No evidence of mitral valve regurgitation. No evidence of mitral valve stenosis. Tricuspid Valve: The tricuspid valve  is grossly normal. Tricuspid valve regurgitation is not demonstrated. No evidence of tricuspid stenosis. Aortic Valve: The aortic valve is grossly normal. Aortic valve regurgitation is not visualized. No aortic stenosis is present. Pulmonic Valve: The pulmonic valve was not well visualized. Pulmonic valve regurgitation is not visualized. No evidence of pulmonic stenosis. Aorta: The aortic root is normal in size and structure. Venous: The inferior vena cava is dilated in size with less than 50% respiratory variability, suggesting right atrial pressure of 15 mmHg. IAS/Shunts: No atrial level shunt detected by color flow Doppler.  LEFT VENTRICLE PLAX 2D LVIDd:         4.90 cm   Diastology LVIDs:         3.10 cm   LV e' medial:    7.51 cm/s LV PW:         1.00 cm   LV E/e' medial:  10.7 LV IVS:        1.10 cm   LV e' lateral:   10.40 cm/s LVOT diam:     2.00 cm   LV E/e' lateral: 7.8 LV SV:         45 LV SV Index:   21 LVOT Area:     3.14 cm  RIGHT VENTRICLE             IVC RV S prime:     14.40 cm/s  IVC diam: 2.30 cm TAPSE (M-mode): 2.0 cm LEFT ATRIUM             Index       RIGHT ATRIUM          Index LA diam:        2.70 cm 1.28 cm/m  RA Area:     7.14 cm LA Vol (A2C):   13.7 ml 6.50 ml/m  RA Volume:   13.00 ml 6.17 ml/m LA Vol (A4C):   19.9 ml 9.45 ml/m LA Biplane Vol: 16.6 ml 7.88 ml/m  AORTIC VALVE LVOT Vmax:   65.50 cm/s LVOT Vmean:  42.400 cm/s LVOT VTI:    0.143 m  AORTA Ao Root diam: 3.70 cm MITRAL VALVE MV Area (PHT): 4.06 cm    SHUNTS MV Decel Time: 187 msec    Systemic VTI:  0.14 m  MV E velocity: 80.70 cm/s  Systemic Diam: 2.00 cm MV A velocity: 77.00 cm/s MV E/A ratio:  1.05 Aditya Sabharwal Electronically signed by Alwin Baars Signature Date/Time: 04/28/2024/1:33:05 PM    Final    DG ERCP Result Date: 04/28/2024 CLINICAL DATA:  Treatment of bile leak post subtotal cholecystectomy EXAM: ERCP TECHNIQUE: Multiple spot images obtained with the fluoroscopic device and submitted for interpretation post-procedure. FLUOROSCOPY: Radiation Exposure Index (as provided by the fluoroscopic device): 23.84 mGy Kerma COMPARISON:  None Available. FINDINGS: Ten fluoroscopic images of the right upper quadrant performed during endoscopy. Images demonstrate flexible endoscope with guidewire in the common bile duct contrast injection demonstrates filling of the mildly dilated common bile duct with filling defects within the distal duct suggesting choledocholithiasis. Balloon sweep of the common bile duct with subsequent removal of choledocholithiasis. Contrast is seen in the cystic duct and extravasated into the gallbladder fossa. Final image demonstrates interval placement of a plastic stent spanning the common bile duct IMPRESSION: ERCP demonstrating cystic duct leak. Choledocholithiasis treated with balloon sweep. Common bile duct stent placement. These images were submitted for radiologic interpretation only. Please see the procedural report for the amount of contrast and the fluoroscopy time utilized. Electronically Signed  By: Susan Ensign   On: 04/28/2024 12:38     Assessment/Plan: 73yo M with polymicrobial bacteremia due to gallstone cholecystitis/cholangitis s/p subtotal cholecystectomy c/b bile leak with CBD stent -continue on iv abtx while at hospital, recommend to complete course of treatment through 6/12. Can transition to augmentin 875mg  po bid plus levofloxacin 750mg  po daily when ready for discharge  We will sign off  evaluation of this patient requires complex antimicrobial  therapy evaluation and counseling and isolation needs for disease transmission risk assessment and mitigation.    [facility] Pager: [phone]  04/29/2024, 6:17 PM

## 2024-04-29 NOTE — Anesthesia Postprocedure Evaluation (Signed)
 Anesthesia Post Note  Patient: CORIN TILLY  Procedure(s) Performed: ERCP, WITH INTERVENTION IF INDICATED ERCP, WITH LITHROTRIPSY OR REMOVAL OF COMMON BILE DUCT CALCULUS USING BALLOON INSERTION, STENT, BILE DUCT SPHINCTEROTOMY,BILIARY     Patient location during evaluation: PACU Anesthesia Type: General Level of consciousness: awake and alert Pain management: pain level controlled Vital Signs Assessment: post-procedure vital signs reviewed and stable Respiratory status: spontaneous breathing, nonlabored ventilation, respiratory function stable and patient connected to nasal cannula oxygen Cardiovascular status: blood pressure returned to baseline and stable Postop Assessment: no apparent nausea or vomiting Anesthetic complications: no   No notable events documented.  Last Vitals:  Vitals:   04/29/24 0000 04/29/24 0400  BP: 112/66 (!) 107/53  Pulse: 61 (!) 56  Resp: 18 12  Temp:    SpO2: 95% 97%    Last Pain:  Vitals:   04/29/24 0115  TempSrc:   PainSc: 10-Worst pain ever                 Quintyn Dombek S

## 2024-04-29 NOTE — Progress Notes (Signed)
 [facility]ology Progress Note  DEGAN HANSER 73 y.o. [birth year]  CC: Bile leak   Subjective: Patient seen and examined at bedside.  Sitting in a recliner.  Continues to have abdominal pain.  ROS : Afebrile, negative for chest pain   Objective: Vital signs in last 24 hours: Vitals:   04/29/24 0901 04/29/24 1228  BP:    Pulse:    Resp:    Temp:  97.9 F (36.6 C)  SpO2: 97%     Physical Exam: Sitting comfortably in the recliner, not in acute distress.  Generalized abdominal tenderness on palpation, bowel sound present, no peritoneal signs, somewhat upset with all this issues.  Alert and oriented x 3  Lab Results: Recent Labs    04/28/24 0342 04/29/24 0328  NA 134* 137  K 3.5 4.3  CL 104 107  CO2 24 25  GLUCOSE 103* 98  BUN 8 7*  CREATININE 0.69 0.82  CALCIUM 7.4* 7.5*  MG 1.9 2.0  PHOS 2.1* 3.5   Recent Labs    04/28/24 0342 04/29/24 0328  AST 39 40  ALT 30 30  ALKPHOS 112 126  BILITOT 1.2 1.3*  PROT 5.1* 5.4*  ALBUMIN  1.8* 1.8*   Recent Labs    04/28/24 0342 04/29/24 0328  WBC 9.8 12.2*  NEUTROABS 8.3* 9.8*  HGB 7.8* 8.2*  HCT 23.5* 25.3*  MCV 97.1 100.4*  PLT 414* 479*   Recent Labs    04/28/24 0342  LABPROT 14.0  INR 1.1      Assessment/Plan: -Bile leak.  Status post ERCP with sphincterotomy with CBD stone removal and a 7 [place] by 7 [place] plastic stent placement yesterday. - Bacteremia in setting of acute cholecystitis and choledocholithiasis.  Likely biliary origin.  Status post subtotal cholecystectomy on May 13   Recommendations ------------------------ - No further inpatient GI workup planned. - Further management per hospital team and surgery - GI will sign off.  Call us  back if needed.  Felecia Hopper MD, FACP 04/29/2024, 3:19 PM  Contact #  [phone]

## 2024-04-30 DIAGNOSIS — K810 Acute cholecystitis: Secondary | ICD-10-CM | POA: Diagnosis not present

## 2024-04-30 LAB — COMPREHENSIVE METABOLIC PANEL WITH GFR
ALT: 31 U/L (ref 0–44)
AST: 39 U/L (ref 15–41)
Albumin: 1.8 g/dL — ABNORMAL LOW (ref 3.5–5.0)
Alkaline Phosphatase: 122 U/L (ref 38–126)
Anion gap: 8 (ref 5–15)
BUN: 8 mg/dL (ref 8–23)
CO2: 22 mmol/L (ref 22–32)
Calcium: 7.7 mg/dL — ABNORMAL LOW (ref 8.9–10.3)
Chloride: 106 mmol/L (ref 98–111)
Creatinine, Ser: 0.82 mg/dL (ref 0.61–1.24)
GFR, Estimated: >60 mL/min (ref >60)
Glucose, Bld: 103 mg/dL — ABNORMAL HIGH (ref 70–99)
Potassium: 3.8 mmol/L (ref 3.5–5.1)
Sodium: 136 mmol/L (ref 135–145)
Total Bilirubin: 1.3 mg/dL — ABNORMAL HIGH (ref 0.0–1.2)
Total Protein: 5.5 g/dL — ABNORMAL LOW (ref 6.5–8.1)

## 2024-04-30 LAB — BRAIN NATRIURETIC PEPTIDE: B Natriuretic Peptide: 319 pg/mL — ABNORMAL HIGH (ref 0.0–100.0)

## 2024-04-30 LAB — CBC WITH DIFFERENTIAL/PLATELET
Abs Immature Granulocytes: 0.22 10*3/uL — ABNORMAL HIGH (ref 0.00–0.07)
Basophils Absolute: 0 10*3/uL (ref 0.0–0.1)
Basophils Relative: 0 %
Eosinophils Absolute: 0.1 10*3/uL (ref 0.0–0.5)
Eosinophils Relative: 1 %
HCT: 25.2 % — ABNORMAL LOW (ref 39.0–52.0)
Hemoglobin: 8.1 g/dL — ABNORMAL LOW (ref 13.0–17.0)
Immature Granulocytes: 2 %
Lymphocytes Relative: 14 %
Lymphs Abs: 1.4 10*3/uL (ref 0.7–4.0)
MCH: 32.3 pg (ref 26.0–34.0)
MCHC: 32.1 g/dL (ref 30.0–36.0)
MCV: 100.4 fL — ABNORMAL HIGH (ref 80.0–100.0)
Monocytes Absolute: 0.6 10*3/uL (ref 0.1–1.0)
Monocytes Relative: 6 %
Neutro Abs: 7.8 10*3/uL — ABNORMAL HIGH (ref 1.7–7.7)
Neutrophils Relative %: 77 %
Platelets: 438 10*3/uL — ABNORMAL HIGH (ref 150–400)
RBC: 2.51 MIL/uL — ABNORMAL LOW (ref 4.22–5.81)
RDW: 16.8 % — ABNORMAL HIGH (ref 11.5–15.5)
WBC: 10.1 10*3/uL (ref 4.0–10.5)
nRBC: 0 % (ref 0.0–0.2)

## 2024-04-30 LAB — PHOSPHORUS: Phosphorus: 3.1 mg/dL (ref 2.5–4.6)

## 2024-04-30 LAB — MAGNESIUM: Magnesium: 1.8 mg/dL (ref 1.7–2.4)

## 2024-04-30 MED ORDER — HYDROMORPHONE HCL 1 MG/ML IJ SOLN
0.5000 mg | INTRAMUSCULAR | Status: DC | PRN
  Administered 2024-04-30 – 2024-05-01 (×2): 0.5 mg via INTRAVENOUS
  Filled 2024-04-30 (×2): qty 0.5

## 2024-04-30 MED ORDER — FUROSEMIDE 10 MG/ML IJ SOLN
20.0000 mg | Freq: Once | INTRAMUSCULAR | Status: AC
  Administered 2024-04-30: 20 mg via INTRAVENOUS
  Filled 2024-04-30: qty 2

## 2024-04-30 MED ORDER — OXYCODONE HCL 5 MG PO TABS
5.0000 mg | ORAL_TABLET | Freq: Four times a day (QID) | ORAL | Status: DC | PRN
  Administered 2024-04-30 – 2024-05-02 (×3): 5 mg via ORAL
  Filled 2024-04-30 (×3): qty 1

## 2024-04-30 NOTE — Discharge Instructions (Signed)
 LAPAROSCOPIC SURGERY: POST OP INSTRUCTIONS Always review your discharge instruction sheet given to you by the facility where your surgery was performed. IF YOU HAVE DISABILITY OR FAMILY LEAVE FORMS, YOU MUST BRING THEM TO THE OFFICE FOR PROCESSING.   DO NOT GIVE THEM TO YOUR DOCTOR.  PAIN CONTROL  First take acetaminophen  (Tylenol ) AND/or ibuprofen (Advil) to control your pain after surgery.  Follow directions on package.  Taking acetaminophen  (Tylenol ) and/or ibuprofen (Advil) regularly after surgery will help to control your pain and lower the amount of prescription pain medication you may need.  You should not take more than 3,000 mg (3 grams) of acetaminophen  (Tylenol ) in 24 hours.  You should not take ibuprofen (Advil), aleve, motrin, naprosyn or other NSAIDS if you have a history of stomach ulcers or chronic kidney disease.  A prescription for pain medication may be given to you upon discharge.  Take your pain medication as prescribed, if you still have uncontrolled pain after taking acetaminophen  (Tylenol ) or ibuprofen (Advil). Use ice packs to help control pain. If you need a refill on your pain medication, please contact your pharmacy.  They will contact our office to request authorization. Prescriptions will not be filled after 5pm or on week-ends.  HOME MEDICATIONS Take your usually prescribed medications unless otherwise directed.  DIET You should follow a light diet the first few days after arrival home.  Be sure to include lots of fluids daily. Avoid fatty, fried foods.   CONSTIPATION It is common to experience some constipation after surgery and if you are taking pain medication.  Increasing fluid intake and taking a stool softener (such as Colace) will usually help or prevent this problem from occurring.  A mild laxative (Milk of Magnesia or Miralax) should be taken according to package instructions if there are no bowel movements after 48 hours.  WOUND/INCISION CARE Most  patients will experience some swelling and bruising in the area of the incisions.  Ice packs will help.  Swelling and bruising can take several days to resolve.  Unless discharge instructions indicate otherwise, follow guidelines below  STERI-STRIPS - you may remove your outer bandages 48 hours after surgery, and you may shower at that time.  You have steri-strips (small skin tapes) in place directly over the incision.  These strips should be left on the skin for 7-10 days.   DERMABOND/SKIN GLUE - you may shower in 24 hours.  The glue will flake off over the next 2-3 weeks. Any sutures or staples will be removed at the office during your follow-up visit.  ACTIVITIES You may resume regular (light) daily activities beginning the next day--such as daily self-care, walking, climbing stairs--gradually increasing activities as tolerated.  You may have sexual intercourse when it is comfortable.  Refrain from any heavy lifting or straining until approved by your doctor. You may drive when you are no longer taking prescription pain medication, you can comfortably wear a seatbelt, and you can safely maneuver your car and apply brakes.  FOLLOW-UP You should see your doctor in the office for a follow-up appointment approximately 2-3 weeks after your surgery.  You should have been given your post-op/follow-up appointment when your surgery was scheduled.  If you did not receive a post-op/follow-up appointment, make sure that you call for this appointment within a day or two after you arrive home to insure a convenient appointment time.   WHEN TO CALL YOUR DOCTOR: Fever over 101.0 Inability to urinate Continued bleeding from incision. Increased pain, redness, or drainage  from the incision. Increasing abdominal pain  The clinic staff is available to answer your questions during regular business hours.  Please don't hesitate to call and ask to speak to one of the nurses for clinical concerns.  If you have a  medical emergency, go to the nearest emergency room or call 911.  A surgeon from [facility] is always on call at the hospital. [address], [address], [place], [place]  [address] ? [address] [address], [place], [place]   [address] [phone] ? [phone] ? FAX [phone] Web site: [email or web address]       Follow with Primary MD Redmon, Noelle, PA in 7 days   Get CBC, CMP, 2 view Chest X ray -  checked next visit with your primary MD or SNF MD   Activity: As tolerated with Full fall precautions use walker/cane & assistance as needed  Disposition SNF  Diet: Heart Healthy    Special Instructions: If you have smoked or chewed Tobacco  in the last 2 yrs please stop smoking, stop any regular Alcohol  and or any Recreational drug use.  On your next visit with your primary care physician please Get Medicines reviewed and adjusted.  Please request your Prim.MD to go over all Hospital Tests and Procedure/Radiological results at the follow up, please get all Hospital records sent to your Prim MD by signing hospital release before you go home.  If you experience worsening of your admission symptoms, develop shortness of breath, life threatening emergency, suicidal or homicidal thoughts you must seek medical attention immediately by calling 911 or calling your MD immediately  if symptoms less severe.  You Must read complete instructions/literature along with all the possible adverse reactions/side effects for all the Medicines you take and that have been prescribed to you. Take any new Medicines after you have completely understood and accpet all the possible adverse reactions/side effects.   Do not drive when taking Pain medications.  Do not take more than prescribed Pain, Sleep and Anxiety Medications  Wear Seat belts while driving.

## 2024-04-30 NOTE — Progress Notes (Signed)
 Occupational Therapy Treatment Patient Details Name: Tony Lopez MRN: [id number] DOB: July 10, 1951 Today's Date: 04/30/2024   History of present illness Pt is a 73 y/o male presenting on 5/23 with ETOH withdrawal with DTs.  Complicated by tachycardia, hyponatermia, and AKI. Polymicrobial bacteremia 5/26. S/p  laparoscopic subtotal cholecystectomy with drain placement 5/30. S/p ERCP with stent 6/2. PMH includes: arthritis, Hep C, HTN, prostate cancer.   OT comments  Patient received in supine and eager to participate. Patient demonstrating good gains with OT treatment with CGA to get to EOB with use of bed rails and min assist to donn gown and mod assist for socks while seated on EOB. Patient instructed on sit to stands and safety with education on hand placement with min assist for transfers to recliner. Patient performed grooming with setup. Patient will benefit from continued inpatient follow up therapy, <3 hours/day. Acute OT to continue to follow to address established goals to facilitate DC to next venue of care.       If plan is discharge home, recommend the following:  Two people to help with walking and/or transfers;Two people to help with bathing/dressing/bathroom;Assistance with cooking/housework;Supervision due to cognitive status;Help with stairs or ramp for entrance;Assist for transportation;Direct supervision/assist for financial management;Direct supervision/assist for medications management;Assistance with feeding   Equipment Recommendations  Other (comment) (TBD)    Recommendations for Other Services      Precautions / Restrictions Precautions Precautions: Fall Recall of Precautions/Restrictions: Impaired Precaution/Restrictions Comments: Rt JP drain Restrictions Weight Bearing Restrictions Per Provider Order: No       Mobility Bed Mobility Overal bed mobility: Needs Assistance Bed Mobility: Supine to Sit Rolling: Min assist   Supine to sit: HOB elevated, Used  rails, Contact guard     General bed mobility comments: CGA with use of rails to get to EOB    Transfers Overall transfer level: Needs assistance Equipment used: Rolling walker (2 wheels) Transfers: Sit to/from Stand, Bed to chair/wheelchair/BSC Sit to Stand: Min assist     Step pivot transfers: Min assist     General transfer comment: min assist with cues for hand placement     Balance Overall balance assessment: Needs assistance Sitting-balance support: No upper extremity supported, Feet supported, Single extremity supported Sitting balance-Leahy Scale: Fair Sitting balance - Comments: EOB   Standing balance support: Bilateral upper extremity supported, Reliant on assistive device for balance Standing balance-Leahy Scale: Poor Standing balance comment: reliant on RW for support                           ADL either performed or assessed with clinical judgement   ADL Overall ADL's : Needs assistance/impaired Eating/Feeding: Set up;Sitting   Grooming: Wash/dry hands;Wash/dry face;Set up;Sitting           Upper Body Dressing : Minimal assistance;Sitting Upper Body Dressing Details (indicate cue type and reason): due to lines Lower Body Dressing: Maximal assistance;Sitting/lateral leans Lower Body Dressing Details (indicate cue type and reason): for socks             Functional mobility during ADLs: Minimal assistance;Rolling walker (2 wheels) General ADL Comments: progress with following commands and transfers    Extremity/Trunk Assessment Upper Extremity Assessment Upper Extremity Assessment: Generalized weakness            Vision       Perception     Praxis     Communication Communication Communication: No apparent difficulties   Cognition Arousal: Alert  Behavior During Therapy: WFL for tasks assessed/performed Cognition: Cognition impaired     Awareness: Intellectual awareness intact Memory impairment (select all impairments):  Short-term memory Attention impairment (select first level of impairment): Sustained attention Executive functioning impairment (select all impairments): Initiation, Problem solving OT - Cognition Comments: alert and following multi-step commands                 Following commands: Intact Following commands impaired: Follows multi-step commands with increased time, Follows multi-step commands inconsistently      Cueing   Cueing Techniques: Verbal cues, Gestural cues, Tactile cues  Exercises      Shoulder Instructions       General Comments VSS    Pertinent Vitals/ Pain       Pain Assessment Pain Assessment: Faces Faces Pain Scale: Hurts a little bit Pain Location: abdomen with bed mobility Pain Descriptors / Indicators: Operative site guarding, Sore Pain Intervention(s): Monitored during session, Repositioned  Home Living                                          Prior Functioning/Environment              Frequency  Min 2X/week        Progress Toward Goals  OT Goals(current goals can now be found in the care plan section)  Progress towards OT goals: Progressing toward goals  Acute Rehab OT Goals Patient Stated Goal: get better OT Goal Formulation: With patient Time For Goal Achievement: 05/05/24 Potential to Achieve Goals: Fair ADL Goals Pt Will Perform Grooming: with min assist;sitting Pt Will Perform Upper Body Bathing: with min assist;sitting Pt Will Transfer to Toilet: with max assist;with +2 assist;bedside commode Additional ADL Goal #1: Pt will completed bed mobility with mod assist. Additional ADL Goal #2: Pt will attend to and follow 1 step commands with 90% accuracy.  Plan      Co-evaluation                 AM-PAC OT "6 Clicks" Daily Activity     Outcome Measure   Help from another person eating meals?: A Little Help from another person taking care of personal grooming?: A Lot Help from another person  toileting, which includes using toliet, bedpan, or urinal?: A Lot Help from another person bathing (including washing, rinsing, drying)?: A Lot Help from another person to put on and taking off regular upper body clothing?: A Lot Help from another person to put on and taking off regular lower body clothing?: A Lot 6 Click Score: 13    End of Session Equipment Utilized During Treatment: Rolling walker (2 wheels)  OT Visit Diagnosis: Other abnormalities of gait and mobility (R26.89);Muscle weakness (generalized) (M62.81);Other symptoms and signs involving cognitive function   Activity Tolerance Patient tolerated treatment well   Patient Left in chair;with call bell/phone within reach;with chair alarm set   Nurse Communication Mobility status        Time: 0802-0826 OT Time Calculation (min): 24 min  Charges: OT General Charges $OT Visit: 1 Visit OT Treatments $Self Care/Home Management : 8-22 mins $Therapeutic Activity: 8-22 mins  Anitra Barn, OTA Acute Rehabilitation Services  Office [phone]   Jovita Nipper 04/30/2024, 10:51 AM

## 2024-04-30 NOTE — Progress Notes (Signed)
 PROGRESS NOTE        PATIENT DETAILS Name: Tony Lopez Age: 73 y.o. Sex: male Date of Birth: [birth year] Admit Date: 04/18/2024 Admitting Physician [name], MD DGU:[id number], [name], [name]  Brief Summary: Patient is a 73 y.o.  male with history of HTN, prostate cancer, GERD, EtOH use-who was admitted for alcohol withdrawal with delirium tremens.  Hospital course complicated by polymicrobial bacteremia and acute calculus cholecystitis.    Significant events: 5/23>> admit to [facility]. 5/26>>Fever-blood culture- gm neg rod/gm +ve rod 5/27>> RUQ tender-CCS consulted. 5/28>> HIDA scan +ve  Significant studies: 5/23>> CT head: No acute intracranial abnormality 5/23>> CT abdomen/pelvis: No acute abnormality. 5/24>> RUQ ultrasound: Biliary sludge/small gallstones.  No evidence of acute cholecystitis. 5/27>> RUQ ultrasound: Distended gallbladder with cholelithiasis/sludge. 5/28>> HIDA scan: Nonvisualization of the gallbladder  Significant microbiology data: 5/25>> GI pathogen panel: Negative 5/26>> blood culture: E. coli/Morganella/gram-positive cocci (BCID staph epi/E. coli-suspect staph epi is a contamination) 5/28>> blood cultures: No growth  Procedures: 5/30>> laparoscopic subtotal cholecystectomy with drain placement. ERCP by [facility] on 04/28/2024 ERCP with 7 French x 7 cm plastic CBD stent placement has been performed uneventfully. [name] bile leak was noted through cystic duct , sludge and 2 CBD stones were removed  TTE 04/28/24 >>  1. Left ventricular ejection fraction, by estimation, is 60 to 65%. The left ventricle has normal function. The left ventricle has no regional wall motion abnormalities. Left ventricular diastolic parameters were normal.   2. Right ventricular systolic function was not well visualized. The right ventricular size is mildly enlarged. RV systolic function appears to be normal.   3. The mitral valve is grossly normal. No evidence  of mitral valve regurgitation. No evidence of mitral stenosis.   4. The aortic valve is grossly normal. Aortic valve regurgitation is not visualized. No aortic stenosis is present.   5. The inferior vena cava is dilated in size with <50% respiratory variability, suggesting right atrial pressure of 15 mmHg.   6. Valves not well visualized, consider TEE if clinically indicated for endocarditis.   Consults: GI CCS ID  Subjective:   Patient in bed, appears comfortable, denies any headache, no fever, no chest pain or pressure, no shortness of breath , no abdominal pain. No new focal weakness.   Objective: Vitals: Blood pressure (!) 150/77, pulse 71, temperature 97.8 F (36.6 C), temperature source Oral, resp. rate 18, height 6' (1.829 m), weight 88.3 kg, SpO2 98%.   Exam:  Awake Alert, No new F.N deficits, Normal affect .AT,PERRAL Supple Neck, No JVD,   Symmetrical Chest wall movement, Good air movement bilaterally, CTAB RRR,No Gallops, Rubs or new Murmurs,  +ve B.Sounds, Abd Soft, minimal RUQ tenderness, gallbladder drain in place No Cyanosis, Clubbing or edema    Assessment/Plan:  EtOH withdrawal with DTs Much improved after supportive care, DTs seem to have now resolved.  Counseled to abstain from alcohol use.  Sinus tachycardia with PACs Secondary to DTs Tachycardia has essentially resolved-with treatment of underlying DTs-and with initiation of low-dose beta-blockers.  Polymicrobial bacteremia (E. coli/Morganella/Enterococcus) E acute calculus cholecystitis, choledocholithiasis S/p subtotal laparoscopic cholecystectomy 5/13 ID following, general surgery and GI following, currently on combination of Unasyn and Cipro Right upper quadrant ultrasound noted with cholelithiasis and sludge, subsequent HIDA scan positive, GI general surgery on board, underwent ERCP by [facility] on 04/28/2024 - 7 [place]  x 7 cm plastic CBD stent placement has been performed uneventfully. [name] bile leak  was noted through cystic duct , sludge and 2 CBD stones were removed. Stable echocardiogram.  ID - continue on iv abtx while at hospital, recommend to complete course of treatment through 6/12. Can transition to augmentin 875mg  po bid plus levofloxacin 750mg  po daily when ready for discharge    Elevated lipase level Initially not felt to have abdominal pain-no evidence of pancreatitis on CT GI on board underwent ERCP suggesting CBD stones which were removed on 04/28/2024 by [facility].  EtOH hepatitis Transaminases have essentially normalized with supportive care Per GI note-no indication for steroid as discriminant function I<10.  AKI Hemodynamically mediated-poor oral intake/use of lisinopril /HCTZ Resolved with supportive care.  Hyponatremia Secondary to dehydration/poor oral intake/HCTZ use Resolved.  Hypomagnesemia/hypophosphatemia Replete/recheck.  Diarrhea Prn immodium GI pathogen panel negative Thankfully diarrhea has resolved.  Thrombocytopenia Secondary to EtOH use Resolved.  Normocytic anemia Probably related to EtOH related bone marrow suppression, trace Hemoccult positive stool, no signs of brisk bleeding, some drop due to heme dilution and perioperative blood loss.  Getting 1 unit of packed RBC on 04/27/2024.  On PPI continue to monitor.  Posttransfusion H&H on 04/28/2024 stable.  Hypophosphatemia.  Replace.   Guaiac positive stool Outpatient GI follow-up-as no evidence of active bleeding.  PPI.  1.6 x 2.4 cm structure arising from left kidney Incidental finding on CT abdomen. Per radiology-favored to represent proteinaceous/hemorrhagic cyst Will need outpatient imaging postdischarge.  Tobacco abuse Transdermal nicotine  Some wheezing today-longstanding history-unclear if he has underlying COPD-starting bronchodilators and seeing how he does.  Debility/deconditioning Secondary to acute illness PT OT eval-SNF recommended  BMI: Estimated body mass index is  26.4 kg/m as calculated from the following:   Height as of this encounter: 6' (1.829 m).   Weight as of this encounter: 88.3 kg.   Code status:   Code Status: Full Code   DVT Prophylaxis: SCDs Start: 04/18/24 2024   Family Communication: Ex wife-Nikki Bartow [phone] updated over the phone 5/31   Disposition Plan: Status is: Inpatient Remains inpatient appropriate because: Severity of illness   Planned Discharge Destination: SNF   Diet: Diet Order             DIET SOFT Fluid consistency: Thin  Diet effective now                   MEDICATIONS: Scheduled Meds:  allopurinol   100 mg Oral Daily   feeding supplement  237 mL Oral BID BM   folic acid   1 mg Oral Daily   furosemide  20 mg Intravenous Once   ipratropium  0.5 mg Nebulization BID   levalbuterol   0.63 mg Nebulization BID   lidocaine   1 patch Transdermal Q24H   metoprolol  tartrate  25 mg Oral BID   multivitamin with minerals  1 tablet Oral Daily   nicotine   21 mg Transdermal Daily   pantoprazole   40 mg Oral BID   thiamine   100 mg Oral Daily   Continuous Infusions:  piperacillin-tazobactam (ZOSYN)  IV 3.375 g (04/30/24 0656)   PRN Meds:.acetaminophen , bisacodyl, HYDROmorphone  (DILAUDID ) injection, loperamide , [DISCONTINUED] ondansetron  **OR** ondansetron  (ZOFRAN ) IV, oxyCODONE , simethicone   I have personally reviewed following labs and imaging studies  LABORATORY DATA:  Data Review:   Inpatient Medications  Scheduled Meds:  allopurinol   100 mg Oral Daily   feeding supplement  237 mL Oral BID BM   folic acid   1 mg Oral Daily  furosemide  20 mg Intravenous Once   ipratropium  0.5 mg Nebulization BID   levalbuterol   0.63 mg Nebulization BID   lidocaine   1 patch Transdermal Q24H   metoprolol  tartrate  25 mg Oral BID   multivitamin with minerals  1 tablet Oral Daily   nicotine   21 mg Transdermal Daily   pantoprazole   40 mg Oral BID   thiamine   100 mg Oral Daily   Continuous Infusions:   piperacillin-tazobactam (ZOSYN)  IV 3.375 g (04/30/24 0656)   PRN Meds:.acetaminophen , bisacodyl, HYDROmorphone  (DILAUDID ) injection, loperamide , [DISCONTINUED] ondansetron  **OR** ondansetron  (ZOFRAN ) IV, oxyCODONE , simethicone  DVT Prophylaxis  SCDs Start: 04/18/24 2024  Recent Labs  Lab 04/27/24 0307 04/27/24 1414 04/28/24 0342 04/29/24 0328 04/30/24 0355  WBC 13.4* 13.3* 9.8 12.2* 10.1  HGB 6.8* 8.4* 7.8* 8.2* 8.1*  HCT 20.8* 25.1* 23.5* 25.3* 25.2*  PLT 325 386 414* 479* 438*  MCV 102.5* 98.8 97.1 100.4* 100.4*  MCH 33.5 33.1 32.2 32.5 32.3  MCHC 32.7 33.5 33.2 32.4 32.1  RDW 15.3 17.1* 16.9* 16.9* 16.8*  LYMPHSABS  --  1.5 1.2 1.4 1.4  MONOABS  --  0.5 0.3 0.6 0.6  EOSABS  --  0.0 0.0 0.1 0.1  BASOSABS  --  0.0 0.0 0.0 0.0    Recent Labs  Lab 04/26/24 0811 04/27/24 0307 04/27/24 0817 04/28/24 0342 04/29/24 0328 04/30/24 0355  NA 139 137 136 134* 137 136  K 3.8 3.4* 3.5 3.5 4.3 3.8  CL 108 106 105 104 107 106  CO2 23 23 23 24 25 22   ANIONGAP 8 8 8 6 5 8   GLUCOSE 104* 105* 100* 103* 98 103*  BUN 12 12 10 8  7* 8  CREATININE 0.76 0.75 0.66 0.69 0.82 0.82  AST  --  54* 49* 39 40 39  ALT  --  32 34 30 30 31   ALKPHOS  --  108 104 112 126 122  BILITOT  --  1.2 1.5* 1.2 1.3* 1.3*  ALBUMIN   --  1.8* 1.8* 1.8* 1.8* 1.8*  INR  --   --   --  1.1  --   --   BNP  --   --   --  469.6* 483.1* 319.0*  MG 2.1  --  2.0 1.9 2.0 1.8  PHOS 2.7  --  2.1* 2.1* 3.5 3.1  CALCIUM 7.5* 7.3* 7.4* 7.4* 7.5* 7.7*      Recent Labs  Lab 04/26/24 0811 04/27/24 0307 04/27/24 0817 04/28/24 0342 04/29/24 0328 04/30/24 0355  INR  --   --   --  1.1  --   --   BNP  --   --   --  469.6* 483.1* 319.0*  MG 2.1  --  2.0 1.9 2.0 1.8  CALCIUM 7.5* 7.3* 7.4* 7.4* 7.5* 7.7*    Radiology Reports  ECHOCARDIOGRAM COMPLETE Result Date: 04/28/2024    ECHOCARDIOGRAM REPORT   Patient Name:   Tony Lopez Date of Exam: 04/28/2024 Medical Rec #:  [id number]          Height:       72.0 in  Accession #:    [id number]         Weight:       194.7 lb Date of Birth:  December 19, 1950         BSA:          2.106 m Patient Age:    65 years  BP:           122/65 mmHg Patient Gender: M                  HR:           67 bpm. Exam Location:  Inpatient Procedure: 2D Echo, Cardiac Doppler and Color Doppler (Both Spectral and Color            Flow Doppler were utilized during procedure). Indications:    Bacteremia  History:        Patient has no prior history of Echocardiogram examinations.  Sonographer:    Janette Medley Referring Phys: Estil Heman [facility]  Sonographer Comments: Suboptimal apical window and Technically difficult study due to poor echo windows. Image acquisition challenging due to patient body habitus. IMPRESSIONS  1. Left ventricular ejection fraction, by estimation, is 60 to 65%. The left ventricle has normal function. The left ventricle has no regional wall motion abnormalities. Left ventricular diastolic parameters were normal.  2. Right ventricular systolic function was not well visualized. The right ventricular size is mildly enlarged. RV systolic function appears to be normal.  3. The mitral valve is grossly normal. No evidence of mitral valve regurgitation. No evidence of mitral stenosis.  4. The aortic valve is grossly normal. Aortic valve regurgitation is not visualized. No aortic stenosis is present.  5. The inferior vena cava is dilated in size with <50% respiratory variability, suggesting right atrial pressure of 15 mmHg.  6. Valves not well visualized, consider TEE if clinically indicated for endocarditis. FINDINGS  Left Ventricle: Left ventricular ejection fraction, by estimation, is 60 to 65%. The left ventricle has normal function. The left ventricle has no regional wall motion abnormalities. The left ventricular internal cavity size was normal in size. There is  no left ventricular hypertrophy. Left ventricular diastolic parameters were normal. Right Ventricle: The right  ventricular size is mildly enlarged. No increase in right ventricular wall thickness. Right ventricular systolic function was not well visualized. Left Atrium: Left atrial size was normal in size. Right Atrium: Right atrial size was normal in size. Pericardium: There is no evidence of pericardial effusion. Mitral Valve: The mitral valve is grossly normal. No evidence of mitral valve regurgitation. No evidence of mitral valve stenosis. Tricuspid Valve: The tricuspid valve is grossly normal. Tricuspid valve regurgitation is not demonstrated. No evidence of tricuspid stenosis. Aortic Valve: The aortic valve is grossly normal. Aortic valve regurgitation is not visualized. No aortic stenosis is present. Pulmonic Valve: The pulmonic valve was not well visualized. Pulmonic valve regurgitation is not visualized. No evidence of pulmonic stenosis. Aorta: The aortic root is normal in size and structure. Venous: The inferior vena cava is dilated in size with less than 50% respiratory variability, suggesting right atrial pressure of 15 mmHg. IAS/Shunts: No atrial level shunt detected by color flow Doppler.  LEFT VENTRICLE PLAX 2D LVIDd:         4.90 cm   Diastology LVIDs:         3.10 cm   LV e' medial:    7.51 cm/s LV PW:         1.00 cm   LV E/e' medial:  10.7 LV IVS:        1.10 cm   LV e' lateral:   10.40 cm/s LVOT diam:     2.00 cm   LV E/e' lateral: 7.8 LV SV:         45 LV SV Index:   21 LVOT  Area:     3.14 cm  RIGHT VENTRICLE             IVC RV S prime:     14.40 cm/s  IVC diam: 2.30 cm TAPSE (M-mode): 2.0 cm LEFT ATRIUM             Index       RIGHT ATRIUM          Index LA diam:        2.70 cm 1.28 cm/m  RA Area:     7.14 cm LA Vol (A2C):   13.7 ml 6.50 ml/m  RA Volume:   13.00 ml 6.17 ml/m LA Vol (A4C):   19.9 ml 9.45 ml/m LA Biplane Vol: 16.6 ml 7.88 ml/m  AORTIC VALVE LVOT Vmax:   65.50 cm/s LVOT Vmean:  42.400 cm/s LVOT VTI:    0.143 m  AORTA Ao Root diam: 3.70 cm MITRAL VALVE MV Area (PHT): 4.06 cm     SHUNTS MV Decel Time: 187 msec    Systemic VTI:  0.14 m MV E velocity: 80.70 cm/s  Systemic Diam: 2.00 cm MV A velocity: 77.00 cm/s MV E/A ratio:  1.05 Aditya Sabharwal Electronically signed by Alwin Baars Signature Date/Time: 04/28/2024/1:33:05 PM    Final    DG ERCP Result Date: 04/28/2024 CLINICAL DATA:  Treatment of bile leak post subtotal cholecystectomy EXAM: ERCP TECHNIQUE: Multiple spot images obtained with the fluoroscopic device and submitted for interpretation post-procedure. FLUOROSCOPY: Radiation Exposure Index (as provided by the fluoroscopic device): 23.84 mGy Kerma COMPARISON:  None Available. FINDINGS: Ten fluoroscopic images of the right upper quadrant performed during endoscopy. Images demonstrate flexible endoscope with guidewire in the common bile duct contrast injection demonstrates filling of the mildly dilated common bile duct with filling defects within the distal duct suggesting choledocholithiasis. Balloon sweep of the common bile duct with subsequent removal of choledocholithiasis. Contrast is seen in the cystic duct and extravasated into the gallbladder fossa. Final image demonstrates interval placement of a plastic stent spanning the common bile duct IMPRESSION: ERCP demonstrating cystic duct leak. Choledocholithiasis treated with balloon sweep. Common bile duct stent placement. These images were submitted for radiologic interpretation only. Please see the procedural report for the amount of contrast and the fluoroscopy time utilized. Electronically Signed   By: Susan Ensign   On: 04/28/2024 12:38      Signature  -   Lynnwood Sauer M.D on 04/30/2024 at 8:12 AM   -  To page go to [email or web address]

## 2024-04-30 NOTE — TOC Progression Note (Addendum)
 Transition of Care [facility]) - Progression Note    Patient Details  Name: Tony Lopez MRN: [id number] Date of Birth: March 18, 1951  Transition of Care [facility]) CM/SW Contact  Jannice Mends, LCSW Phone Number: 04/30/2024, 8:56 AM  Clinical Narrative:    8:56am-Nikki has selected 1-[facility], 2-[facility]. CSW inquiring with facility if room at [facility] would be private.   2:36 PM-[facility] does not have private room. CSW spoke with the patient and he requested private room at [facility]. Facility aware. CSW initiated insurance authorization process Ref# [id number], and received approval effective 05/01/2024-05/05/2024.   Updated Nikki.    Expected Discharge Plan: Skilled Nursing Facility Barriers to Discharge: Continued Medical Work up, English as a second language teacher  Expected Discharge Plan and Services In-house Referral: Clinical Social Work   Post Acute Care Choice: Skilled Nursing Facility Living arrangements for the past 2 months: Single Family Home                                       Social Determinants of Health (SDOH) Interventions SDOH Screenings   Food Insecurity: No Food Insecurity (04/19/2024)  Housing: Low Risk  (04/19/2024)  Transportation Needs: No Transportation Needs (04/19/2024)  Utilities: Not At Risk (04/19/2024)  Tobacco Use: High Risk (04/28/2024)    Readmission Risk Interventions     No data to display

## 2024-04-30 NOTE — Plan of Care (Signed)
 Progressing

## 2024-05-01 DIAGNOSIS — K810 Acute cholecystitis: Secondary | ICD-10-CM | POA: Diagnosis not present

## 2024-05-01 LAB — COMPREHENSIVE METABOLIC PANEL WITH GFR
ALT: 32 U/L (ref 0–44)
AST: 36 U/L (ref 15–41)
Albumin: 1.9 g/dL — ABNORMAL LOW (ref 3.5–5.0)
Alkaline Phosphatase: 142 U/L — ABNORMAL HIGH (ref 38–126)
Anion gap: 10 (ref 5–15)
BUN: 8 mg/dL (ref 8–23)
CO2: 23 mmol/L (ref 22–32)
Calcium: 8.2 mg/dL — ABNORMAL LOW (ref 8.9–10.3)
Chloride: 104 mmol/L (ref 98–111)
Creatinine, Ser: 0.83 mg/dL (ref 0.61–1.24)
GFR, Estimated: >60 mL/min (ref >60)
Glucose, Bld: 112 mg/dL — ABNORMAL HIGH (ref 70–99)
Potassium: 3.9 mmol/L (ref 3.5–5.1)
Sodium: 137 mmol/L (ref 135–145)
Total Bilirubin: 1.2 mg/dL (ref 0.0–1.2)
Total Protein: 5.4 g/dL — ABNORMAL LOW (ref 6.5–8.1)

## 2024-05-01 LAB — CBC WITH DIFFERENTIAL/PLATELET
Abs Immature Granulocytes: 0.24 10*3/uL — ABNORMAL HIGH (ref 0.00–0.07)
Basophils Absolute: 0 10*3/uL (ref 0.0–0.1)
Basophils Relative: 0 %
Eosinophils Absolute: 0.1 10*3/uL (ref 0.0–0.5)
Eosinophils Relative: 1 %
HCT: 25.9 % — ABNORMAL LOW (ref 39.0–52.0)
Hemoglobin: 8.3 g/dL — ABNORMAL LOW (ref 13.0–17.0)
Immature Granulocytes: 3 %
Lymphocytes Relative: 10 %
Lymphs Abs: 0.9 10*3/uL (ref 0.7–4.0)
MCH: 32.4 pg (ref 26.0–34.0)
MCHC: 32 g/dL (ref 30.0–36.0)
MCV: 101.2 fL — ABNORMAL HIGH (ref 80.0–100.0)
Monocytes Absolute: 0.6 10*3/uL (ref 0.1–1.0)
Monocytes Relative: 6 %
Neutro Abs: 7.3 10*3/uL (ref 1.7–7.7)
Neutrophils Relative %: 80 %
Platelets: 428 10*3/uL — ABNORMAL HIGH (ref 150–400)
RBC: 2.56 MIL/uL — ABNORMAL LOW (ref 4.22–5.81)
RDW: 16.9 % — ABNORMAL HIGH (ref 11.5–15.5)
WBC: 9.1 10*3/uL (ref 4.0–10.5)
nRBC: 0 % (ref 0.0–0.2)

## 2024-05-01 LAB — BRAIN NATRIURETIC PEPTIDE: B Natriuretic Peptide: 254.3 pg/mL — ABNORMAL HIGH (ref 0.0–100.0)

## 2024-05-01 LAB — MAGNESIUM: Magnesium: 1.8 mg/dL (ref 1.7–2.4)

## 2024-05-01 LAB — PHOSPHORUS: Phosphorus: 3 mg/dL (ref 2.5–4.6)

## 2024-05-01 NOTE — Plan of Care (Signed)
  Problem: Clinical Measurements: Goal: Will remain free from infection Outcome: Progressing Goal: Diagnostic test results will improve Outcome: Progressing   Problem: Activity: Goal: Risk for activity intolerance will decrease Outcome: Progressing   Problem: Nutrition: Goal: Adequate nutrition will be maintained Outcome: Progressing   Problem: Coping: Goal: Level of anxiety will decrease Outcome: Progressing   Problem: Elimination: Goal: Will not experience complications related to bowel motility Outcome: Progressing   Problem: Pain Managment: Goal: General experience of comfort will improve and/or be controlled Outcome: Progressing

## 2024-05-01 NOTE — TOC Progression Note (Addendum)
 Transition of Care [facility]) - Progression Note    Patient Details  Name: STANISLAUS KALTENBACH MRN: [id number] Date of Birth: 1951-10-03  [facility]) CM/SW Contact  Jannice Mends, LCSW Phone Number: 05/01/2024, 11:49 AM  Clinical Narrative:    CSW spoke with patient and confirmed plan to discharge to [facility] tomorrow. Confirmed with Sallyanne Creamer at [facility] that they will still have bed for patient tomorrow. Updated Nikki. Patient requested help to get into the chair. CSW sent request to the team.     Expected Discharge Plan: Skilled Nursing Facility Barriers to Discharge: Continued Medical Work up, English as a second language teacher  Expected Discharge Plan and Services In-house Referral: Clinical Social Work   Post Acute Care Choice: Skilled Nursing Facility Living arrangements for the past 2 months: Single Family Home                                       Social Determinants of Health (SDOH) Interventions SDOH Screenings   Food Insecurity: No Food Insecurity (04/19/2024)  Housing: Low Risk  (04/19/2024)  Transportation Needs: No Transportation Needs (04/19/2024)  Utilities: Not At Risk (04/19/2024)  Tobacco Use: High Risk (04/28/2024)    Readmission Risk Interventions     No data to display

## 2024-05-01 NOTE — Progress Notes (Signed)
 PROGRESS NOTE        PATIENT DETAILS Name: Tony Lopez Age: 73 y.o. Sex: male Date of Birth: 05-Jan-1951 Admit Date: 04/18/2024 Admitting Physician [name], MD WUJ:[id number], [name], [name]  Brief Summary: Patient is a 73 y.o.  male with history of HTN, prostate cancer, GERD, EtOH use-who was admitted for alcohol withdrawal with delirium tremens.  Hospital course complicated by polymicrobial bacteremia and acute calculus cholecystitis.    Significant events: 5/23>> admit to [facility]. 5/26>>Fever-blood culture- gm neg rod/gm +ve rod 5/27>> RUQ tender-CCS consulted. 5/28>> HIDA scan +ve  Significant studies: 5/23>> CT head: No acute intracranial abnormality 5/23>> CT abdomen/pelvis: No acute abnormality. 5/24>> RUQ ultrasound: Biliary sludge/small gallstones.  No evidence of acute cholecystitis. 5/27>> RUQ ultrasound: Distended gallbladder with cholelithiasis/sludge. 5/28>> HIDA scan: Nonvisualization of the gallbladder  Significant microbiology data: 5/25>> GI pathogen panel: Negative 5/26>> blood culture: E. coli/Morganella/gram-positive cocci (BCID staph epi/E. coli-suspect staph epi is a contamination) 5/28>> blood cultures: No growth  Procedures: 5/30>> laparoscopic subtotal cholecystectomy with drain placement. ERCP by [facility] on 04/28/2024 ERCP with 7 French x 7 cm plastic CBD stent placement has been performed uneventfully. [name] bile leak was noted through cystic duct , sludge and 2 CBD stones were removed  TTE 04/28/24 >>  1. Left ventricular ejection fraction, by estimation, is 60 to 65%. The left ventricle has normal function. The left ventricle has no regional wall motion abnormalities. Left ventricular diastolic parameters were normal.   2. Right ventricular systolic function was not well visualized. The right ventricular size is mildly enlarged. RV systolic function appears to be normal.   3. The mitral valve is grossly normal. No evidence  of mitral valve regurgitation. No evidence of mitral stenosis.   4. The aortic valve is grossly normal. Aortic valve regurgitation is not visualized. No aortic stenosis is present.   5. The inferior vena cava is dilated in size with <50% respiratory variability, suggesting right atrial pressure of 15 mmHg.   6. Valves not well visualized, consider TEE if clinically indicated for endocarditis.   Consults: GI CCS ID  Subjective:   Patient in bed, appears comfortable, denies any headache, no fever, no chest pain or pressure, no shortness of breath , no abdominal pain. No new focal weakness.   Objective: Vitals: Blood pressure 132/78, pulse 74, temperature 98 F (36.7 C), temperature source Oral, resp. rate 15, height 6' (1.829 m), weight 88.3 kg, SpO2 95%.   Exam:  Awake Alert, No new F.N deficits, Normal affect [place].AT,PERRAL Supple Neck, No JVD,   Symmetrical Chest wall movement, Good air movement bilaterally, CTAB RRR,No Gallops, Rubs or new Murmurs,  +ve B.Sounds, Abd Soft, minimal RUQ tenderness, gallbladder drain in place No Cyanosis, Clubbing or edema    Assessment/Plan:  EtOH withdrawal with DTs Much improved after supportive care, DTs seem to have now resolved.  Counseled to abstain from alcohol use.  Sinus tachycardia with PACs Secondary to DTs Tachycardia has essentially resolved-with treatment of underlying DTs-and with initiation of low-dose beta-blockers.  Polymicrobial bacteremia (E. coli/Morganella/Enterococcus) E acute calculus cholecystitis, choledocholithiasis S/p subtotal laparoscopic cholecystectomy 5/13 ID following, general surgery and GI following, currently on combination of Unasyn and Cipro Right upper quadrant ultrasound noted with cholelithiasis and sludge, subsequent HIDA scan positive, GI general surgery on board, underwent ERCP by [facility] on 04/28/2024 - 7 [place] x  7 cm plastic CBD stent placement has been performed uneventfully. [name] bile leak was  noted through cystic duct , sludge and 2 CBD stones were removed. Stable echocardiogram.  ID - continue on iv abtx while at hospital, recommend to complete course of treatment through 05/08/24. Can transition to augmentin 875mg  po bid plus levofloxacin 750mg  po daily when ready for discharge    Elevated lipase level Initially not felt to have abdominal pain-no evidence of pancreatitis on CT GI on board underwent ERCP suggesting CBD stones which were removed on 04/28/2024 by [facility].  EtOH hepatitis Transaminases have essentially normalized with supportive care Per GI note-no indication for steroid as discriminant function I<10.  AKI Hemodynamically mediated-poor oral intake/use of lisinopril /HCTZ Resolved with supportive care.  Hyponatremia Secondary to dehydration/poor oral intake/HCTZ use Resolved.  Hypomagnesemia/hypophosphatemia Replete/recheck.  Diarrhea Prn immodium GI pathogen panel negative Thankfully diarrhea has resolved.  Thrombocytopenia Secondary to EtOH use Resolved.  Normocytic anemia Probably related to EtOH related bone marrow suppression, trace Hemoccult positive stool, no signs of brisk bleeding, some drop due to heme dilution and perioperative blood loss.  Getting 1 unit of packed RBC on 04/27/2024.  On PPI continue to monitor.  Posttransfusion H&H on 04/28/2024 stable.  Hypophosphatemia.  Replace.   Guaiac positive stool Outpatient GI follow-up-as no evidence of active bleeding.  PPI.  1.6 x 2.4 cm structure arising from left kidney Incidental finding on CT abdomen. Per radiology-favored to represent proteinaceous/hemorrhagic cyst Will need outpatient imaging postdischarge.  Tobacco abuse Transdermal nicotine  Some wheezing today-longstanding history-unclear if he has underlying COPD-starting bronchodilators and seeing how he does.  Debility/deconditioning Secondary to acute illness PT OT eval-SNF recommended  BMI: Estimated body mass index is  26.4 kg/m as calculated from the following:   Height as of this encounter: 6' (1.829 m).   Weight as of this encounter: 88.3 kg.   Code status:   Code Status: Full Code   DVT Prophylaxis: SCDs Start: 04/18/24 2024   Family Communication: Ex wife-Nikki Noga [phone] updated over the phone 5/31   Disposition Plan: Status is: Inpatient Remains inpatient appropriate because: Severity of illness   Planned Discharge Destination: SNF   Diet: Diet Order             Diet regular Room service appropriate? Yes; Fluid consistency: Thin  Diet effective now                   MEDICATIONS: Scheduled Meds:  allopurinol   100 mg Oral Daily   feeding supplement  237 mL Oral BID BM   folic acid   1 mg Oral Daily   ipratropium  0.5 mg Nebulization BID   levalbuterol   0.63 mg Nebulization BID   lidocaine   1 patch Transdermal Q24H   metoprolol  tartrate  25 mg Oral BID   multivitamin with minerals  1 tablet Oral Daily   nicotine   21 mg Transdermal Daily   pantoprazole   40 mg Oral BID   thiamine   100 mg Oral Daily   Continuous Infusions:  piperacillin-tazobactam (ZOSYN)  IV 3.375 g (05/01/24 0638)   PRN Meds:.acetaminophen , bisacodyl, HYDROmorphone  (DILAUDID ) injection, loperamide , [DISCONTINUED] ondansetron  **OR** ondansetron  (ZOFRAN ) IV, oxyCODONE , simethicone   I have personally reviewed following labs and imaging studies  LABORATORY DATA:  Data Review:   Inpatient Medications  Scheduled Meds:  allopurinol   100 mg Oral Daily   feeding supplement  237 mL Oral BID BM   folic acid   1 mg Oral Daily   ipratropium  0.5  mg Nebulization BID   levalbuterol   0.63 mg Nebulization BID   lidocaine   1 patch Transdermal Q24H   metoprolol  tartrate  25 mg Oral BID   multivitamin with minerals  1 tablet Oral Daily   nicotine   21 mg Transdermal Daily   pantoprazole   40 mg Oral BID   thiamine   100 mg Oral Daily   Continuous Infusions:  piperacillin-tazobactam (ZOSYN)  IV 3.375 g  (05/01/24 2440)   PRN Meds:.acetaminophen , bisacodyl, HYDROmorphone  (DILAUDID ) injection, loperamide , [DISCONTINUED] ondansetron  **OR** ondansetron  (ZOFRAN ) IV, oxyCODONE , simethicone  DVT Prophylaxis  SCDs Start: 04/18/24 2024  Recent Labs  Lab 04/27/24 1414 04/28/24 0342 04/29/24 0328 04/30/24 0355 05/01/24 0421  WBC 13.3* 9.8 12.2* 10.1 9.1  HGB 8.4* 7.8* 8.2* 8.1* 8.3*  HCT 25.1* 23.5* 25.3* 25.2* 25.9*  PLT 386 414* 479* 438* 428*  MCV 98.8 97.1 100.4* 100.4* 101.2*  MCH 33.1 32.2 32.5 32.3 32.4  MCHC 33.5 33.2 32.4 32.1 32.0  RDW 17.1* 16.9* 16.9* 16.8* 16.9*  LYMPHSABS 1.5 1.2 1.4 1.4 0.9  MONOABS 0.5 0.3 0.6 0.6 0.6  EOSABS 0.0 0.0 0.1 0.1 0.1  BASOSABS 0.0 0.0 0.0 0.0 0.0    Recent Labs  Lab 04/27/24 0817 04/28/24 0342 04/29/24 0328 04/30/24 0355 05/01/24 0421  NA 136 134* 137 136 137  K 3.5 3.5 4.3 3.8 3.9  CL 105 104 107 106 104  CO2 23 24 25 22 23   ANIONGAP 8 6 5 8 10   GLUCOSE 100* 103* 98 103* 112*  BUN 10 8 7* 8 8  CREATININE 0.66 0.69 0.82 0.82 0.83  AST 49* 39 40 39 36  ALT 34 30 30 31  32  ALKPHOS 104 112 126 122 142*  BILITOT 1.5* 1.2 1.3* 1.3* 1.2  ALBUMIN  1.8* 1.8* 1.8* 1.8* 1.9*  INR  --  1.1  --   --   --   BNP  --  469.6* 483.1* 319.0* 254.3*  MG 2.0 1.9 2.0 1.8 1.8  PHOS 2.1* 2.1* 3.5 3.1 3.0  CALCIUM 7.4* 7.4* 7.5* 7.7* 8.2*      Recent Labs  Lab 04/27/24 0817 04/28/24 0342 04/29/24 0328 04/30/24 0355 05/01/24 0421  INR  --  1.1  --   --   --   BNP  --  469.6* 483.1* 319.0* 254.3*  MG 2.0 1.9 2.0 1.8 1.8  CALCIUM 7.4* 7.4* 7.5* 7.7* 8.2*    Radiology Reports  No results found.     Signature  -   Lynnwood Sauer M.D on 05/01/2024 at 10:04 AM   -  To page go to [email or web address]

## 2024-05-02 DIAGNOSIS — K810 Acute cholecystitis: Secondary | ICD-10-CM | POA: Diagnosis not present

## 2024-05-02 MED ORDER — PANTOPRAZOLE SODIUM 40 MG PO TBEC: 40.0000 mg | DELAYED_RELEASE_TABLET | Freq: Two times a day (BID) | ORAL | Status: AC

## 2024-05-02 MED ORDER — TRAMADOL HCL 50 MG PO TABS: 50.0000 mg | ORAL_TABLET | Freq: Four times a day (QID) | ORAL | 0 refills | Status: DC | PRN

## 2024-05-02 MED ORDER — ADULT MULTIVITAMIN W/MINERALS CH: 1.0000 | ORAL_TABLET | Freq: Every day | ORAL | Status: AC

## 2024-05-02 MED ORDER — NICOTINE 21 MG/24HR TD PT24: 21.0000 mg | MEDICATED_PATCH | Freq: Every day | TRANSDERMAL | Status: DC

## 2024-05-02 MED ORDER — FOLIC ACID 1 MG PO TABS: 1.0000 mg | ORAL_TABLET | Freq: Every day | ORAL | Status: AC

## 2024-05-02 MED ORDER — METOPROLOL TARTRATE 25 MG PO TABS: 25.0000 mg | ORAL_TABLET | Freq: Two times a day (BID) | ORAL | Status: AC

## 2024-05-02 MED ORDER — AMOXICILLIN-POT CLAVULANATE 875-125 MG PO TABS: 1.0000 | ORAL_TABLET | Freq: Two times a day (BID) | ORAL | Status: DC

## 2024-05-02 MED ORDER — VITAMIN B-1 100 MG PO TABS: 100.0000 mg | ORAL_TABLET | Freq: Every day | ORAL | Status: AC

## 2024-05-02 MED ORDER — LEVOFLOXACIN 750 MG PO TABS: 750.0000 mg | ORAL_TABLET | Freq: Every day | ORAL | Status: DC

## 2024-05-02 MED ORDER — LEVOFLOXACIN 750 MG PO TABS
750.0000 mg | ORAL_TABLET | Freq: Every day | ORAL | Status: DC
  Administered 2024-05-02: 750 mg via ORAL
  Filled 2024-05-02: qty 1

## 2024-05-02 MED ORDER — AMOXICILLIN-POT CLAVULANATE 875-125 MG PO TABS
1.0000 | ORAL_TABLET | Freq: Two times a day (BID) | ORAL | Status: DC
  Administered 2024-05-02: 1 via ORAL
  Filled 2024-05-02: qty 1

## 2024-05-02 NOTE — TOC Transition Note (Signed)
 Transition of Care [facility]) - Discharge Note   Patient Details  Name: Tony Lopez MRN: [id number] Date of Birth: [birth year]  [facility]) CM/SW Contact:  Jannice Mends, LCSW Phone Number: 05/02/2024, 12:47 PM   Clinical Narrative:    Patient will DC to: [facility] Anticipated DC date: 05/02/24 Family notified: [facility] Transport by: Lyna Sandhoff   Per MD patient ready for DC to [facility]. RN to call report prior to discharge ([phone] room [phone]). RN, patient, patient's family, and facility notified of DC. Discharge Summary and FL2 sent to facility. DC packet on chart. Ambulance transport requested for patient.   CSW will sign off for now as social work intervention is no longer needed. Please consult us  again if new needs arise.     Final next level of care: Skilled Nursing Facility Barriers to Discharge: Barriers Resolved   Patient Goals and CMS Choice Patient states their goals for this hospitalization and ongoing recovery are:: Rehab CMS Medicare.gov Compare Post Acute Care list provided to:: Patient Choice offered to / list presented to : Patient [place] ownership interest in [facility].provided to:: Patient    Discharge Placement   Existing PASRR number confirmed : 05/02/24          Patient [facility] bed at: [facility] Patient to be transferred to facility by: [facility] Name of family member notified: [facility] Patient and family notified of of transfer: 05/02/24  Discharge Plan and Services Additional resources added to the After Visit Summary for   In-house Referral: Clinical Social Work   Post Acute Care Choice: Skilled Nursing Facility                               Social Drivers of Health (SDOH) Interventions SDOH Screenings   Food Insecurity: No Food Insecurity (04/19/2024)  Housing: Low Risk  (04/19/2024)  Transportation Needs: No Transportation Needs (04/19/2024)  Utilities: Not At Risk (04/19/2024)  Tobacco  Use: High Risk (04/28/2024)     Readmission Risk Interventions     No data to display

## 2024-05-02 NOTE — Progress Notes (Signed)
 Physical Therapy Treatment Patient Details Name: Tony Lopez MRN: [id number] DOB: Nov 13, 1951 Today's Date: 05/02/2024   History of Present Illness Pt is a 73 y/o male presenting on 5/23 with ETOH withdrawal with DTs.  Complicated by tachycardia, hyponatermia, and AKI. Polymicrobial bacteremia 5/26. S/p  laparoscopic subtotal cholecystectomy with drain placement 5/30. S/p ERCP with stent 6/2. PMH includes: arthritis, Hep C, HTN, prostate cancer.    PT Comments  Continues to progress towards acute functional goals, which are updated today based on progress made. Tolerated short distance gait with CGA and RW for support, mild instability. Easily fatigued. Reviewed LE exercises. Patient will continue to benefit from skilled physical therapy services to further improve independence with functional mobility.     If plan is discharge home, recommend the following: Assistance with cooking/housework;Assist for transportation;Help with stairs or ramp for entrance;Direct supervision/assist for financial management;Direct supervision/assist for medications management;Supervision due to cognitive status;A lot of help with walking and/or transfers;A little help with bathing/dressing/bathroom   Can travel by private vehicle     Yes  Equipment Recommendations  Wheelchair cushion (measurements PT);Wheelchair (measurements PT);BSC/3in1    Recommendations for Other Services       Precautions / Restrictions Precautions Precautions: Fall Recall of Precautions/Restrictions: Impaired Precaution/Restrictions Comments: Rt JP drain Restrictions Weight Bearing Restrictions Per Provider Order: No     Mobility  Bed Mobility Overal bed mobility: Needs Assistance Bed Mobility: Supine to Sit Rolling: Contact guard assist         General bed mobility comments: CGA for safety, able to rise without assist today, slow and effortful. managed lines/leads/drain for pt.    Transfers Overall transfer  level: Needs assistance Equipment used: Rolling walker (2 wheels) Transfers: Sit to/from Stand, Bed to chair/wheelchair/BSC Sit to Stand: Min assist           General transfer comment: Min assist for boost to stand. Steady with RW for support. Cues for hand placement    Ambulation/Gait Ambulation/Gait assistance: Contact guard assist Gait Distance (Feet): 25 Feet Assistive device: Rolling walker (2 wheels) Gait Pattern/deviations: Step-through pattern, Decreased stride length, Trunk flexed Gait velocity: dec Gait velocity interpretation: <1.31 ft/sec, indicative of household ambulator   General Gait Details: CGA for safety, minor instability but able to self correct with RW. Cues for proximity to device and safe use. No overt buckling noted today. Easily fatigued.   Stairs             Wheelchair Mobility     Tilt Bed    Modified Rankin (Stroke Patients Only)       Balance Overall balance assessment: Needs assistance Sitting-balance support: No upper extremity supported, Feet supported, Single extremity supported Sitting balance-Leahy Scale: Fair     Standing balance support: Bilateral upper extremity supported, Reliant on assistive device for balance Standing balance-Leahy Scale: Poor Standing balance comment: reliant on RW for support                            Communication Communication Communication: No apparent difficulties  Cognition Arousal: Alert Behavior During Therapy: WFL for tasks assessed/performed   PT - Cognitive impairments: No family/caregiver present to determine baseline, Problem solving, Safety/Judgement                         Following commands: Intact Following commands impaired: Follows multi-step commands with increased time, Follows multi-step commands inconsistently    Cueing Cueing Techniques: Verbal cues,  Gestural cues, Tactile cues  Exercises General Exercises - Lower Extremity Ankle Circles/Pumps:  AROM, Both, 10 reps, Seated Quad Sets: Strengthening, Both, 10 reps, Seated Long Arc Quad: Strengthening, Both, 10 reps, Seated Hip Flexion/Marching: Strengthening, Both, 10 reps, Seated    General Comments General comments (skin integrity, edema, etc.): VSS      Pertinent Vitals/Pain Pain Assessment Pain Assessment: Faces Faces Pain Scale: Hurts little more Pain Location: abdomen with bed mobility Pain Descriptors / Indicators: Operative site guarding, Sore Pain Intervention(s): Monitored during session    Home Living                          Prior Function            PT Goals (current goals can now be found in the care plan section) Acute Rehab PT Goals Patient Stated Goal: Get well PT Goal Formulation: With patient/family Time For Goal Achievement: 05/05/24 Potential to Achieve Goals: Good Progress towards PT goals: Progressing toward goals    Frequency    Min 2X/week      PT Plan      Co-evaluation              AM-PAC PT "6 Clicks" Mobility   Outcome Measure  Help needed turning from your back to your side while in a flat bed without using bedrails?: None Help needed moving from lying on your back to sitting on the side of a flat bed without using bedrails?: A Little Help needed moving to and from a bed to a chair (including a wheelchair)?: A Little Help needed standing up from a chair using your arms (e.g., wheelchair or bedside chair)?: A Little Help needed to walk in hospital room?: A Little Help needed climbing 3-5 steps with a railing? : Total 6 Click Score: 17    End of Session Equipment Utilized During Treatment: Gait belt Activity Tolerance: Patient tolerated treatment well Patient left: with call bell/phone within reach;in chair;with chair alarm set;with nursing/sitter in room Nurse Communication: Mobility status PT Visit Diagnosis: Muscle weakness (generalized) (M62.81);Other abnormalities of gait and mobility  (R26.89);Unsteadiness on feet (R26.81);Difficulty in walking, not elsewhere classified (R26.2);Pain Pain - part of body:  (abdomen)     Time: 6073-7106 PT Time Calculation (min) (ACUTE ONLY): 20 min  Charges:    $Gait Training: 8-22 mins PT General Charges $$ ACUTE PT VISIT: 1 Visit                     Jory Ng, PT, DPT [facility]  Rehabilitation Services Physical Therapist Office: [phone] Website: [email or web address]    Alinda Irani 05/02/2024, 10:57 AM

## 2024-05-02 NOTE — Discharge Summary (Signed)
 Tony Lopez:[id number] DOB: 04-06-1951 DOA: 04/18/2024  PCP: Lopez, Noelle, PA  Admit date: 04/18/2024  Discharge date: 05/02/2024  Admitted From: Home   Disposition:  SNF   Recommendations for Outpatient Follow-up:   Follow up with PCP in 1-2 weeks  PCP Please obtain BMP/CBC, 2 view CXR in 1week,  (see Discharge instructions)   PCP Please follow up on the following pending results:  Will need outpatient imaging for possible left renal stricture postdischarge, defer to PCP.   Home Health: None   Equipment/Devices: None  Consultations: GI, CCS, ID Discharge Condition: Stable    CODE STATUS: Full    Diet Recommendation: Heart Healthy     Chief Complaint  Patient presents with   Medical Clearance    Medical clearance for [facility]     Brief history of present illness from the day of admission and additional interim summary    73 y.o.  male with history of HTN, prostate cancer, GERD, EtOH use-who was admitted for alcohol withdrawal with delirium tremens.  Hospital course complicated by polymicrobial bacteremia and acute calculus cholecystitis.     Significant events: 5/23>> admit to [facility]. 5/26>>Fever-blood culture- gm neg rod/gm +ve rod 5/27>> RUQ tender-[facility] consulted. 5/28>> HIDA scan +ve   Significant studies: 5/23>> CT head: No acute intracranial abnormality 5/23>> CT abdomen/pelvis: No acute abnormality. 5/24>> RUQ ultrasound: Biliary sludge/small gallstones.  No evidence of acute cholecystitis. 5/27>> RUQ ultrasound: Distended gallbladder with cholelithiasis/sludge. 5/28>> HIDA scan: Nonvisualization of the gallbladder   Significant microbiology data: 5/25>> GI pathogen panel: Negative 5/26>> blood culture: E. coli/Morganella/gram-positive cocci (BCID staph epi/E. coli-suspect staph epi is a  contamination) 5/28>> blood cultures: No growth   Procedures: 5/30>> laparoscopic subtotal cholecystectomy with drain placement. ERCP by [facility] on 04/28/2024 ERCP with 7 French x 7 cm plastic CBD stent placement has been performed uneventfully. [name] bile leak was noted through cystic duct , sludge and 2 CBD stones were removed  TTE 04/28/24 >>  1. Left ventricular ejection fraction, by estimation, is 60 to 65%. The left ventricle has normal function. The left ventricle has no regional wall motion abnormalities. Left ventricular diastolic parameters were normal.   2. Right ventricular systolic function was not well visualized. The right ventricular size is mildly enlarged. RV systolic function appears to be normal.   3. The mitral valve is grossly normal. No evidence of mitral valve regurgitation. No evidence of mitral stenosis.   4. The aortic valve is grossly normal. Aortic valve regurgitation is not visualized. No aortic stenosis is present.   5. The inferior vena cava is dilated in size with <50% respiratory variability, suggesting right atrial pressure of 15 mmHg.   6. Valves not well visualized, consider TEE if clinically indicated for endocarditis.  Hospital Course   EtOH withdrawal with DTs with sinus tachycardia and PACs. Much improved after supportive care, DTs seem to have now resolved.  Counseled to abstain from alcohol use.  Now stable.  Polymicrobial bacteremia (E. coli/Morganella/Enterococcus) E acute calculus cholecystitis, choledocholithiasis S/p subtotal laparoscopic cholecystectomy 5/13 ID following, general surgery and GI following, currently on combination of Unasyn and Cipro Right upper quadrant ultrasound noted with cholelithiasis and sludge, subsequent HIDA scan positive, GI general surgery on board, underwent ERCP by [facility] on 04/28/2024 - 7 French x 7 cm plastic CBD stent placement has been performed  uneventfully. [name] bile leak was noted through cystic duct , sludge and 2 CBD stones were removed. Stable echocardiogram.   ID - continue on iv abtx while at hospital, recommend to complete course of treatment through 05/08/24. Can transition to augmentin 875mg  po bid plus levofloxacin 750mg  po daily when ready for discharge.  Stop date for antibiotics 05/08/2024.     Elevated lipase level Initially not felt to have abdominal pain-no evidence of pancreatitis on CT GI on board underwent ERCP suggesting CBD stones which were removed on 04/28/2024 by [facility].   EtOH hepatitis Transaminases have essentially normalized with supportive care Per GI note-no indication for steroid as discriminant function I<10.  Stable outpatient GI follow-up recommended.   AKI Hemodynamically mediated-poor oral intake/use of lisinopril /HCTZ Resolved with supportive care.  Thrombocytopenia Secondary to EtOH use Resolved.   Normocytic anemia Probably related to EtOH related bone marrow suppression, trace Hemoccult positive stool, no signs of brisk bleeding, some drop due to heme dilution and perioperative blood loss.  Getting 1 unit of packed RBC on 04/27/2024.  On PPI continue to monitor.  Posttransfusion H&H on 04/28/2024 stable.   Guaiac positive stool On PPI follow-up with GI outpatient seen by GI here.   1.6 x 2.4 cm structure arising from left kidney Incidental finding on CT abdomen. Per radiology-favored to represent proteinaceous/hemorrhagic cyst Will need outpatient imaging postdischarge, defer to PCP.   Tobacco abuse Transdermal nicotine  No acute issue.   Debility/deconditioning Secondary to acute illness PT OT eval-SNF recommended    Discharge diagnosis     Principal Problem:   Empyema of gallbladder Active Problems:   Prostatic adenocarcinoma (HCC)   Hepatitis C antibody positive in blood   Alcohol withdrawal (HCC)   Alcoholic hepatitis   Hyponatremia   AKI (acute kidney injury)  (HCC)   GI bleed   Acute cholecystitis with chronic cholecystitis   Bile leak    Discharge instructions    Discharge Instructions     Discharge instructions   Complete by: As directed    Follow with Primary MD Lopez, Noelle, PA in 7 days   Get CBC, CMP, 2 view Chest X ray -  checked next visit with your primary MD or SNF MD   Activity: As tolerated with Full fall precautions use walker/cane & assistance as needed  Disposition SNF  Diet: Heart Healthy    Special Instructions: If you have smoked or chewed Tobacco  in the last 2 yrs please stop smoking, stop any regular Alcohol  and or any Recreational drug use.  On your next visit with your primary care physician please Get Medicines reviewed and adjusted.  Please request your Prim.MD to go over all Hospital Tests and Procedure/Radiological results at the follow up, please get all Hospital records sent to your Prim MD by signing hospital release before you go home.  If you experience worsening of your admission symptoms, develop  shortness of breath, life threatening emergency, suicidal or homicidal thoughts you must seek medical attention immediately by calling 911 or calling your MD immediately  if symptoms less severe.  You Must read complete instructions/literature along with all the possible adverse reactions/side effects for all the Medicines you take and that have been prescribed to you. Take any new Medicines after you have completely understood and accpet all the possible adverse reactions/side effects.   Do not drive when taking Pain medications.  Do not take more than prescribed Pain, Sleep and Anxiety Medications  Wear Seat belts while driving.   Discharge wound care:   Complete by: As directed    Empty the right upper quadrant drain twice a day and monitor the output.  Keep the drain site clean and dry at all times.   Increase activity slowly   Complete by: As directed        Discharge Medications   Allergies  as of 05/02/2024   No Known Allergies      Medication List     STOP taking these medications    amLODipine 5 MG tablet Commonly known as: NORVASC   lisinopril -hydrochlorothiazide  20-25 MG tablet Commonly known as: ZESTORETIC    omeprazole 20 MG capsule Commonly known as: PRILOSEC   potassium chloride  SA 20 MEQ tablet Commonly known as: KLOR-CON  M       TAKE these medications    acetaminophen  500 MG tablet Commonly known as: TYLENOL  Take 1 tablet (500 mg total) by mouth every 6 (six) hours as needed. What changed: reasons to take this   allopurinol  100 MG tablet Commonly known as: ZYLOPRIM  Take 100 mg by mouth daily. What changed: Another medication with the same name was removed. Continue taking this medication, and follow the directions you see here.   amoxicillin-clavulanate 875-125 MG tablet Commonly known as: AUGMENTIN Take 1 tablet by mouth every 12 (twelve) hours.   atorvastatin 10 MG tablet Commonly known as: LIPITOR Take 10 mg by mouth daily.   folic acid  1 MG tablet Commonly known as: FOLVITE  Take 1 tablet (1 mg total) by mouth daily.   levofloxacin 750 MG tablet Commonly known as: LEVAQUIN Take 1 tablet (750 mg total) by mouth daily.   metoprolol  tartrate 25 MG tablet Commonly known as: LOPRESSOR  Take 1 tablet (25 mg total) by mouth 2 (two) times daily.   multivitamin with minerals Tabs tablet Take 1 tablet by mouth daily.   nicotine  21 mg/24hr patch Commonly known as: NICODERM CQ  - dosed in mg/24 hours Place 1 patch (21 mg total) onto the skin daily.   pantoprazole  40 MG tablet Commonly known as: PROTONIX  Take 1 tablet (40 mg total) by mouth 2 (two) times daily.   thiamine  100 MG tablet Commonly known as: Vitamin B-1 Take 1 tablet (100 mg total) by mouth daily.   traMADol 50 MG tablet Commonly known as: Ultram Take 1 tablet (50 mg total) by mouth every 6 (six) hours as needed.               Discharge Care Instructions  (From  admission, onward)           Start     Ordered   05/02/24 0000  Discharge wound care:       Comments: Empty the right upper quadrant drain twice a day and monitor the output.  Keep the drain site clean and dry at all times.   05/02/24 1610  Contact information for follow-up providers     Melvenia Stabs, MD. Go on 05/12/2024.   Specialties: General Surgery, Colon and Rectal Surgery Why: 1:30 PM for drain check and possible removal. Please arrive 30 min prior to appointment time. Contact information: [address] [place] [address] [phone]         Diamond Formica, [name]. Schedule an appointment as soon as possible for a visit in 1 week(s).   Specialty: Physician Assistant Contact information: [address] [place] [address] [phone]         Genell Ken, MD. Schedule an appointment as soon as possible for a visit in 1 week(s).   Specialty: Gastroenterology Contact information: [address] [place] [address] [phone]              Contact information for after-discharge care     Destination     [facility] Preferred SNF .   Service: Skilled Nursing Contact information: [address] [place]  [address] [phone]                     Major procedures and Radiology Reports - PLEASE review detailed and final reports thoroughly  -       ECHOCARDIOGRAM COMPLETE Result Date: 04/28/2024    ECHOCARDIOGRAM REPORT   Patient Name:   Tony Lopez Date of Exam: 04/28/2024 Medical Rec #:  [id number]          Height:       72.0 in Accession #:    [id number]         Weight:       194.7 lb Date of Birth:  1951-04-21         BSA:          2.106 m Patient Age:    72 years           BP:           122/65 mmHg Patient Gender: M                  HR:           67 bpm. Exam Location:  Inpatient Procedure: 2D Echo, Cardiac  Doppler and Color Doppler (Both Spectral and Color            Flow Doppler were utilized during procedure). Indications:    Bacteremia  History:        Patient has no prior history of Echocardiogram examinations.  Sonographer:    Janette Medley Referring Phys: Estil Heman [facility]  Sonographer Comments: Suboptimal apical window and Technically difficult study due to poor echo windows. Image acquisition challenging due to patient body habitus. IMPRESSIONS  1. Left ventricular ejection fraction, by estimation, is 60 to 65%. The left ventricle has normal function. The left ventricle has no regional wall motion abnormalities. Left ventricular diastolic parameters were normal.  2. Right ventricular systolic function was not well visualized. The right ventricular size is mildly enlarged. RV systolic function appears to be normal.  3. The mitral valve is grossly normal. No evidence of mitral valve regurgitation. No evidence of mitral stenosis.  4. The aortic valve is grossly normal. Aortic valve regurgitation is not visualized. No aortic stenosis is present.  5. The inferior vena cava is dilated in size with <50% respiratory variability, suggesting right atrial pressure of 15 mmHg.  6. Valves not well visualized, consider TEE if clinically indicated  for endocarditis. FINDINGS  Left Ventricle: Left ventricular ejection fraction, by estimation, is 60 to 65%. The left ventricle has normal function. The left ventricle has no regional wall motion abnormalities. The left ventricular internal cavity size was normal in size. There is  no left ventricular hypertrophy. Left ventricular diastolic parameters were normal. Right Ventricle: The right ventricular size is mildly enlarged. No increase in right ventricular wall thickness. Right ventricular systolic function was not well visualized. Left Atrium: Left atrial size was normal in size. Right Atrium: Right atrial size was normal in size. Pericardium: There is no evidence of pericardial  effusion. Mitral Valve: The mitral valve is grossly normal. No evidence of mitral valve regurgitation. No evidence of mitral valve stenosis. Tricuspid Valve: The tricuspid valve is grossly normal. Tricuspid valve regurgitation is not demonstrated. No evidence of tricuspid stenosis. Aortic Valve: The aortic valve is grossly normal. Aortic valve regurgitation is not visualized. No aortic stenosis is present. Pulmonic Valve: The pulmonic valve was not well visualized. Pulmonic valve regurgitation is not visualized. No evidence of pulmonic stenosis. Aorta: The aortic root is normal in size and structure. Venous: The inferior vena cava is dilated in size with less than 50% respiratory variability, suggesting right atrial pressure of 15 mmHg. IAS/Shunts: No atrial level shunt detected by color flow Doppler.  LEFT VENTRICLE PLAX 2D LVIDd:         4.90 cm   Diastology LVIDs:         3.10 cm   LV e' medial:    7.51 cm/s LV PW:         1.00 cm   LV E/e' medial:  10.7 LV IVS:        1.10 cm   LV e' lateral:   10.40 cm/s LVOT diam:     2.00 cm   LV E/e' lateral: 7.8 LV SV:         45 LV SV Index:   21 LVOT Area:     3.14 cm  RIGHT VENTRICLE             IVC RV S prime:     14.40 cm/s  IVC diam: 2.30 cm TAPSE (M-mode): 2.0 cm LEFT ATRIUM             Index       RIGHT ATRIUM          Index LA diam:        2.70 cm 1.28 cm/m  RA Area:     7.14 cm LA Vol (A2C):   13.7 ml 6.50 ml/m  RA Volume:   13.00 ml 6.17 ml/m LA Vol (A4C):   19.9 ml 9.45 ml/m LA Biplane Vol: 16.6 ml 7.88 ml/m  AORTIC VALVE LVOT Vmax:   65.50 cm/s LVOT Vmean:  42.400 cm/s LVOT VTI:    0.143 m  AORTA Ao Root diam: 3.70 cm MITRAL VALVE MV Area (PHT): 4.06 cm    SHUNTS MV Decel Time: 187 msec    Systemic VTI:  0.14 m MV E velocity: 80.70 cm/s  Systemic Diam: 2.00 cm MV A velocity: 77.00 cm/s MV E/A ratio:  1.05 Aditya Sabharwal Electronically signed by Alwin Baars Signature Date/Time: 04/28/2024/1:33:05 PM    Final    DG ERCP Result Date:  04/28/2024 CLINICAL DATA:  Treatment of bile leak post subtotal cholecystectomy EXAM: ERCP TECHNIQUE: Multiple spot images obtained with the fluoroscopic device and submitted for interpretation post-procedure. FLUOROSCOPY: Radiation Exposure Index (as provided by the fluoroscopic device): 23.84 mGy  Kerma COMPARISON:  None Available. FINDINGS: Ten fluoroscopic images of the right upper quadrant performed during endoscopy. Images demonstrate flexible endoscope with guidewire in the common bile duct contrast injection demonstrates filling of the mildly dilated common bile duct with filling defects within the distal duct suggesting choledocholithiasis. Balloon sweep of the common bile duct with subsequent removal of choledocholithiasis. Contrast is seen in the cystic duct and extravasated into the gallbladder fossa. Final image demonstrates interval placement of a plastic stent spanning the common bile duct IMPRESSION: ERCP demonstrating cystic duct leak. Choledocholithiasis treated with balloon sweep. Common bile duct stent placement. These images were submitted for radiologic interpretation only. Please see the procedural report for the amount of contrast and the fluoroscopy time utilized. Electronically Signed   By: Susan Ensign   On: 04/28/2024 12:38   NM Hepatobiliary Liver Func Result Date: 04/23/2024 CLINICAL DATA:  Right upper quadrant abdominal pain concern for acalculous cholecystitis. EXAM: NUCLEAR MEDICINE HEPATOBILIARY IMAGING TECHNIQUE: Sequential images of the abdomen were obtained out to 60 minutes following intravenous administration of radiopharmaceutical. RADIOPHARMACEUTICALS:  7.5 mCi Tc-48m  Choletec  IV COMPARISON:  Ultrasound Apr 22, 2024 FINDINGS: Prompt uptake and biliary excretion of activity by the liver is seen. Gallbladder activity is not visualized. Biliary activity passes into small bowel, consistent with patent common bile duct. IMPRESSION: Non visualization of the gallbladder  compatible with occluded cystic duct/cholecystitis. Electronically Signed   By: Tama Fails M.D.   On: 04/23/2024 15:39   US  Abdomen Limited RUQ (LIVER/GB) Result Date: 04/22/2024 CLINICAL DATA:  151471 RUQ pain 151471 EXAM: ULTRASOUND ABDOMEN LIMITED RIGHT UPPER QUADRANT COMPARISON:  None Available. FINDINGS: Gallbladder: Distended up to 5 x 4.8 x 13.5 cm. Tumefactive slide and a few gallstones up to 7 mm in the lumen of the gallbladder Common bile duct: Diameter: 3.6 mm Liver: Mildly echogenic parenchyma without focal lesion. Portal vein is patent on color Doppler imaging with normal direction of blood flow towards the liver. Other: Technologist describes technically difficult study secondary to body habitus, patient immobility, patient sleeping. IMPRESSION: 1. Distended gallbladder with cholelithiasis and tumefactive sludge. 2. No biliary dilatation. 3. Mildly echogenic liver parenchyma suggesting steatosis. Electronically Signed   By: Nicoletta Barrier M.D.   On: 04/22/2024 13:29   DG Chest Port 1V same Day Result Date: 04/21/2024 CLINICAL DATA:  [id number] Fever [id number] EXAM: PORTABLE CHEST 1 VIEW COMPARISON:  None Available. FINDINGS: The cardiomediastinal silhouette is upper limits of normal in contour. No pleural effusion. No pneumothorax. No acute pleuroparenchymal abnormality. IMPRESSION: No acute cardiopulmonary abnormality. Electronically Signed   By: Clancy Crimes M.D.   On: 04/21/2024 11:46   US  Abdomen Limited RUQ (LIVER/GB) Result Date: 04/19/2024 CLINICAL DATA:  Abnormal liver function tests, alcohol abuse, hepatitis C. EXAM: ULTRASOUND ABDOMEN LIMITED RIGHT UPPER QUADRANT COMPARISON:  CT of the abdomen on 04/18/2024 FINDINGS: Gallbladder: There is some biliary sludge in the gallbladder as well as some probable small gallstones. No evidence of gallbladder wall thickening or sonographic Murphy's sign. Common bile duct: Diameter: 6-7 mm Liver: The liver demonstrates coarse echotexture and  increased echogenicity, likely reflecting diffuse steatosis. No overt cirrhotic contour abnormalities or focal lesions are identified. There is no evidence of intrahepatic biliary ductal dilatation. Portal vein is patent on color Doppler imaging with normal direction of blood flow towards the liver. Other: No ascites visualized in the right upper quadrant. IMPRESSION: 1. Biliary sludge and probable small gallstones in the gallbladder. No evidence of acute cholecystitis. 2. Diffuse hepatic steatosis. Electronically Signed  By: Erica Hau M.D.   On: 04/19/2024 09:34   CT ABDOMEN PELVIS WO CONTRAST Result Date: 04/18/2024 CLINICAL DATA:  Abdominal pain, acute, nonlocalized. History of prostate carcinoma. * Tracking Code: BO * EXAM: CT ABDOMEN AND PELVIS WITHOUT CONTRAST TECHNIQUE: Multidetector CT imaging of the abdomen and pelvis was performed following the standard protocol without IV contrast. RADIATION DOSE REDUCTION: This exam was performed according to the departmental dose-optimization program which includes automated exposure control, adjustment of the mA and/or kV according to patient size and/or use of iterative reconstruction technique. COMPARISON:  None Available. FINDINGS: Lower chest: The lung bases are clear. No pleural effusion. The heart is normal in size. No pericardial effusion. Partially seen lipomatous hypertrophy of interatrial septum. Hepatobiliary: The liver is normal in size. Non-cirrhotic configuration. No suspicious mass. There is marked diffuse hepatic steatosis. No intrahepatic or extrahepatic bile duct dilation. Small volume subcentimeter sized calcified gallstones without imaging signs of acute cholecystitis. Normal gallbladder wall thickness. No pericholecystic inflammatory changes. Pancreas: Unremarkable. No pancreatic ductal dilatation or surrounding inflammatory changes. Spleen: Within normal limits. No focal lesion. Adrenals/Urinary Tract: Adrenal glands are unremarkable.  There is a partially exophytic approximately 1.6 x 2.4 cm mildly hypoattenuating structure (in relation to the renal cortex) arising from the left kidney interpolar region, laterally, with internal CT attenuation of 26-32 Hounsfield units. This cannot be characterized as a simple cyst. This is incompletely characterized on the current exam but favored to represent a proteinaceous/hemorrhagic cyst. Comparison can be made if prior imaging is available. Otherwise, consider further evaluation with ultrasound. No nephroureterolithiasis or obstructive uropathy on either side. Unremarkable urinary bladder. Stomach/Bowel: No disproportionate dilation of the small or large bowel loops. No evidence of abnormal bowel wall thickening or inflammatory changes. The appendix is unremarkable. There are scattered diverticula mainly in the left hemi colon, without imaging signs of diverticulitis. Vascular/Lymphatic: No ascites or pneumoperitoneum. No abdominal or pelvic lymphadenopathy, by size criteria. No aneurysmal dilation of the major abdominal arteries. There are moderate peripheral atherosclerotic vascular calcifications of the aorta and its major branches. Reproductive: Patient is status post surgical removal of prostate and bilateral seminal vesicles. Other: There is a tiny fat containing umbilical hernia. The soft tissues and abdominal wall are otherwise unremarkable. Musculoskeletal: No suspicious osseous lesions. There are mild multilevel degenerative changes in the visualized spine. IMPRESSION: 1. No acute inflammatory process identified within the abdomen or pelvis. No nephroureterolithiasis or obstructive uropathy on either side. 2. There is a partially exophytic approximately 1.6 x 2.4 cm mildly hypoattenuating structure arising from the left kidney interpolar region, laterally, with internal CT attenuation of 26-32 Hounsfield units. This cannot be characterized as a simple cyst. This is incompletely characterized on  the current exam but favored to represent a proteinaceous/hemorrhagic cyst. Comparison can be made if prior imaging is available. Otherwise, consider further evaluation with ultrasound. 3. No metastatic prostate carcinoma seen in the abdomen or pelvis. 4. Multiple other nonacute observations, as described above. Aortic Atherosclerosis (ICD10-I70.0). Electronically Signed   By: Beula Brunswick M.D.   On: 04/18/2024 16:27   CT Head Wo Contrast Result Date: 04/18/2024 CLINICAL DATA:  Mental status change, unknown cause EXAM: CT HEAD WITHOUT CONTRAST TECHNIQUE: Contiguous axial images were obtained from the base of the skull through the vertex without intravenous contrast. RADIATION DOSE REDUCTION: This exam was performed according to the departmental dose-optimization program which includes automated exposure control, adjustment of the mA and/or kV according to patient size and/or use of iterative reconstruction technique. COMPARISON:  CT head March 14, 2007. FINDINGS: Brain: No evidence of acute infarction, hemorrhage, hydrocephalus, extra-axial collection or mass lesion/mass effect. Small remote right cerebellar infarct. Vascular: No hyperdense vessel.  Calcific atherosclerosis. Skull: No acute fracture. Sinuses/Orbits: Clear sinuses.  No acute orbital findings. Other: No mastoid effusions. IMPRESSION: No evidence of acute intracranial abnormality. Electronically Signed   By: Stevenson Elbe M.D.   On: 04/18/2024 15:37    Micro Results     Recent Results (from the past 240 hours)  Culture, blood (Routine X 2) w Reflex to ID Panel     Status: None   Collection Time: 04/23/24  7:55 AM   Specimen: BLOOD RIGHT ARM  Result Value Ref Range Status   Specimen Description BLOOD RIGHT ARM  Final   Special Requests   Final    BOTTLES DRAWN AEROBIC AND ANAEROBIC Blood Culture results may not be optimal due to an inadequate volume of blood received in culture bottles   Culture   Final    NO GROWTH 5  DAYS Performed at [facility], [address] [place], [place] [address]    Report Status 04/28/2024 FINAL  Final  Culture, blood (Routine X 2) w Reflex to ID Panel     Status: None   Collection Time: 04/23/24  7:57 AM   Specimen: BLOOD LEFT HAND  Result Value Ref Range Status   Specimen Description BLOOD LEFT HAND  Final   Special Requests   Final    BOTTLES DRAWN AEROBIC AND ANAEROBIC Blood Culture adequate volume   Culture   Final    NO GROWTH 5 DAYS Performed at [facility], [address], [place], [place] [address]    Report Status 04/28/2024 FINAL  Final    Today   Subjective    Derin Granquist today has no headache,no chest abdominal pain,no new weakness tingling or numbness, feels much better     Objective   Blood pressure 139/75, pulse 78, temperature 97.8 F (36.6 C), temperature source Oral, resp. rate 17, height 6' (1.829 m), weight 88.3 kg, SpO2 94%.   Intake/Output Summary (Last 24 hours) at 05/02/2024 0737 Last data filed at 05/02/2024 0700 Gross per 24 hour  Intake --  Output 350 ml  Net -350 ml    Exam  Awake Alert, No new F.N deficits,    [place].AT,PERRAL Supple Neck,   Symmetrical Chest wall movement, Good air movement bilaterally, CTAB RRR,No Gallops,   +ve B.Sounds, Abd Soft, Non tender, right upper quadrant  drain in place No Cyanosis, Clubbing or edema    Data Review   Recent Labs  Lab 04/27/24 1414 04/28/24 0342 04/29/24 0328 04/30/24 0355 05/01/24 0421  WBC 13.3* 9.8 12.2* 10.1 9.1  HGB 8.4* 7.8* 8.2* 8.1* 8.3*  HCT 25.1* 23.5* 25.3* 25.2* 25.9*  PLT 386 414* 479* 438* 428*  MCV 98.8 97.1 100.4* 100.4* 101.2*  MCH 33.1 32.2 32.5 32.3 32.4  MCHC 33.5 33.2 32.4 32.1 32.0  RDW 17.1* 16.9* 16.9* 16.8* 16.9*  LYMPHSABS 1.5 1.2 1.4 1.4 0.9  MONOABS 0.5 0.3 0.6 0.6 0.6  EOSABS 0.0 0.0 0.1 0.1 0.1  BASOSABS 0.0 0.0 0.0 0.0 0.0    Recent Labs  Lab 04/27/24 0817 04/28/24 0342 04/29/24 0328 04/30/24 0355  05/01/24 0421  NA 136 134* 137 136 137  K 3.5 3.5 4.3 3.8 3.9  CL 105 104 107 106 104  CO2 23 24 25 22 23   ANIONGAP 8 6 5 8 10   GLUCOSE  100* 103* 98 103* 112*  BUN 10 8 7* 8 8  CREATININE 0.66 0.69 0.82 0.82 0.83  AST 49* 39 40 39 36  ALT 34 30 30 31  32  ALKPHOS 104 112 126 122 142*  BILITOT 1.5* 1.2 1.3* 1.3* 1.2  ALBUMIN  1.8* 1.8* 1.8* 1.8* 1.9*  INR  --  1.1  --   --   --   BNP  --  469.6* 483.1* 319.0* 254.3*  MG 2.0 1.9 2.0 1.8 1.8  PHOS 2.1* 2.1* 3.5 3.1 3.0  CALCIUM 7.4* 7.4* 7.5* 7.7* 8.2*    Total Time in preparing paper work, data evaluation and todays exam - 35 minutes  Signature  -    Lynnwood Sauer M.D on 05/02/2024 at 7:37 AM   -  To page go to [email or web address]

## 2024-05-19 ENCOUNTER — Emergency Department (HOSPITAL_COMMUNITY)

## 2024-05-19 ENCOUNTER — Inpatient Hospital Stay (HOSPITAL_COMMUNITY)
Admission: EM | Admit: 2024-05-19 | Discharge: 2024-05-23 | DRG: 291 | Disposition: A | Discharge status: Home / Self Care | Source: Skilled Nursing Facility | Attending: Internal Medicine | Admitting: Internal Medicine

## 2024-05-19 DIAGNOSIS — E8809 Other disorders of plasma-protein metabolism, not elsewhere classified: Secondary | ICD-10-CM | POA: Diagnosis present

## 2024-05-19 DIAGNOSIS — Z801 Family history of malignant neoplasm of trachea, bronchus and lung: Secondary | ICD-10-CM

## 2024-05-19 DIAGNOSIS — I5031 Acute diastolic (congestive) heart failure: Secondary | ICD-10-CM | POA: Diagnosis present

## 2024-05-19 DIAGNOSIS — J439 Emphysema, unspecified: Secondary | ICD-10-CM | POA: Diagnosis present

## 2024-05-19 DIAGNOSIS — E876 Hypokalemia: Secondary | ICD-10-CM | POA: Diagnosis present

## 2024-05-19 DIAGNOSIS — F1011 Alcohol abuse, in remission: Secondary | ICD-10-CM | POA: Diagnosis present

## 2024-05-19 DIAGNOSIS — I110 Hypertensive heart disease with heart failure: Secondary | ICD-10-CM | POA: Diagnosis not present

## 2024-05-19 DIAGNOSIS — K760 Fatty (change of) liver, not elsewhere classified: Secondary | ICD-10-CM | POA: Diagnosis present

## 2024-05-19 DIAGNOSIS — I493 Ventricular premature depolarization: Secondary | ICD-10-CM | POA: Diagnosis present

## 2024-05-19 DIAGNOSIS — E8770 Fluid overload, unspecified: Secondary | ICD-10-CM | POA: Diagnosis present

## 2024-05-19 DIAGNOSIS — R609 Edema, unspecified: Secondary | ICD-10-CM

## 2024-05-19 DIAGNOSIS — Z9049 Acquired absence of other specified parts of digestive tract: Secondary | ICD-10-CM

## 2024-05-19 DIAGNOSIS — J9811 Atelectasis: Secondary | ICD-10-CM | POA: Diagnosis present

## 2024-05-19 DIAGNOSIS — I77810 Thoracic aortic ectasia: Secondary | ICD-10-CM | POA: Diagnosis present

## 2024-05-19 DIAGNOSIS — Z79899 Other long term (current) drug therapy: Secondary | ICD-10-CM

## 2024-05-19 DIAGNOSIS — K219 Gastro-esophageal reflux disease without esophagitis: Secondary | ICD-10-CM | POA: Diagnosis present

## 2024-05-19 DIAGNOSIS — Z8546 Personal history of malignant neoplasm of prostate: Secondary | ICD-10-CM

## 2024-05-19 DIAGNOSIS — M542 Cervicalgia: Secondary | ICD-10-CM | POA: Diagnosis present

## 2024-05-19 DIAGNOSIS — I080 Rheumatic disorders of both mitral and aortic valves: Secondary | ICD-10-CM | POA: Diagnosis present

## 2024-05-19 DIAGNOSIS — F17210 Nicotine dependence, cigarettes, uncomplicated: Secondary | ICD-10-CM | POA: Diagnosis present

## 2024-05-19 DIAGNOSIS — Z8619 Personal history of other infectious and parasitic diseases: Secondary | ICD-10-CM

## 2024-05-19 DIAGNOSIS — M7989 Other specified soft tissue disorders: Secondary | ICD-10-CM | POA: Diagnosis present

## 2024-05-19 DIAGNOSIS — D174 Benign lipomatous neoplasm of intrathoracic organs: Secondary | ICD-10-CM | POA: Diagnosis present

## 2024-05-19 DIAGNOSIS — I509 Heart failure, unspecified: Principal | ICD-10-CM

## 2024-05-19 LAB — CBC WITH DIFFERENTIAL/PLATELET
Abs Immature Granulocytes: 0.01 10*3/uL (ref 0.00–0.07)
Basophils Absolute: 0 10*3/uL (ref 0.0–0.1)
Basophils Relative: 0 %
Eosinophils Absolute: 0.2 10*3/uL (ref 0.0–0.5)
Eosinophils Relative: 4 %
HCT: 34.1 % — ABNORMAL LOW (ref 39.0–52.0)
Hemoglobin: 11 g/dL — ABNORMAL LOW (ref 13.0–17.0)
Immature Granulocytes: 0 %
Lymphocytes Relative: 19 %
Lymphs Abs: 1.1 10*3/uL (ref 0.7–4.0)
MCH: 32.5 pg (ref 26.0–34.0)
MCHC: 32.3 g/dL (ref 30.0–36.0)
MCV: 100.9 fL — ABNORMAL HIGH (ref 80.0–100.0)
Monocytes Absolute: 0.7 10*3/uL (ref 0.1–1.0)
Monocytes Relative: 12 %
Neutro Abs: 3.9 10*3/uL (ref 1.7–7.7)
Neutrophils Relative %: 65 %
Platelets: 263 10*3/uL (ref 150–400)
RBC: 3.38 MIL/uL — ABNORMAL LOW (ref 4.22–5.81)
RDW: 13.5 % (ref 11.5–15.5)
WBC: 5.9 10*3/uL (ref 4.0–10.5)
nRBC: 0 % (ref 0.0–0.2)

## 2024-05-19 LAB — COMPREHENSIVE METABOLIC PANEL WITH GFR
ALT: 24 U/L (ref 0–44)
AST: 28 U/L (ref 15–41)
Albumin: 2.5 g/dL — ABNORMAL LOW (ref 3.5–5.0)
Alkaline Phosphatase: 128 U/L — ABNORMAL HIGH (ref 38–126)
Anion gap: 13 (ref 5–15)
BUN: <5 mg/dL — ABNORMAL LOW (ref 8–23)
CO2: 24 mmol/L (ref 22–32)
Calcium: 8.2 mg/dL — ABNORMAL LOW (ref 8.9–10.3)
Chloride: 103 mmol/L (ref 98–111)
Creatinine, Ser: 0.81 mg/dL (ref 0.61–1.24)
GFR, Estimated: >60 mL/min (ref >60)
Glucose, Bld: 102 mg/dL — ABNORMAL HIGH (ref 70–99)
Potassium: 3.1 mmol/L — ABNORMAL LOW (ref 3.5–5.1)
Sodium: 140 mmol/L (ref 135–145)
Total Bilirubin: 0.7 mg/dL (ref 0.0–1.2)
Total Protein: 5.7 g/dL — ABNORMAL LOW (ref 6.5–8.1)

## 2024-05-19 LAB — BRAIN NATRIURETIC PEPTIDE: B Natriuretic Peptide: 895.8 pg/mL — ABNORMAL HIGH (ref 0.0–100.0)

## 2024-05-19 NOTE — ED Provider Triage Note (Signed)
 Emergency Medicine Provider Triage Evaluation Note  Tony Lopez , a 73 y.o. male  was evaluated in triage.  Pt complains of leg swelling mild shortness of breath.  Review of Systems  Positive: Shortness of breath Negative: Tony Lopez  Physical Exam  BP (!) 181/74 (BP Location: Right Arm)   Pulse (!) 59   Temp 98.7 F (37.1 C) (Oral)   Resp 18   SpO2 97%  Edema left upper extremity.  Also did not extremities worsening.  Medical Decision Making  Medically screening exam initiated at 3:05 PM.  Appropriate orders placed.  Tony Lopez was informed that the remainder of the evaluation will be completed by another provider, this initial triage assessment does not replace that evaluation, and the importance of remaining in the ED until their evaluation is complete.  Patient recent mission the hospital for bacteremia choledocholithiasis.  Rehab.  Now increasing shortness of breath along with swelling left upper extremity and lower extremities.  Differential diagnosis/sepsis DVT.  Will start with Doppler x-ray and blood work.   Tony Lot, MD 05/19/24 1505

## 2024-05-19 NOTE — ED Triage Notes (Signed)
 PT BIB EMS from rehab facility. Pt recently had surgery to remove gallbladder and has been at facility since. 2 days ago noticed left arm and left leg swelling. Today started to get SOB on exertion. No pain at this time.

## 2024-05-20 ENCOUNTER — Emergency Department (HOSPITAL_COMMUNITY)

## 2024-05-20 ENCOUNTER — Observation Stay (HOSPITAL_COMMUNITY)

## 2024-05-20 DIAGNOSIS — E8770 Fluid overload, unspecified: Secondary | ICD-10-CM | POA: Diagnosis present

## 2024-05-20 DIAGNOSIS — I5030 Unspecified diastolic (congestive) heart failure: Secondary | ICD-10-CM | POA: Diagnosis not present

## 2024-05-20 LAB — ECHOCARDIOGRAM LIMITED
Area-P 1/2: 3.65 cm2
Calc EF: 61.9 %
S' Lateral: 3.1 cm
Single Plane A2C EF: 63.6 %
Single Plane A4C EF: 59.5 %

## 2024-05-20 LAB — CBG MONITORING, ED: Glucose-Capillary: 88 mg/dL (ref 70–99)

## 2024-05-20 MED ORDER — ENOXAPARIN SODIUM 40 MG/0.4ML IJ SOSY
40.0000 mg | PREFILLED_SYRINGE | INTRAMUSCULAR | Status: DC
  Administered 2024-05-20 – 2024-05-22 (×3): 40 mg via SUBCUTANEOUS
  Filled 2024-05-20 (×3): qty 0.4

## 2024-05-20 MED ORDER — THIAMINE MONONITRATE 100 MG PO TABS
100.0000 mg | ORAL_TABLET | Freq: Every day | ORAL | Status: DC
  Administered 2024-05-20 – 2024-05-23 (×4): 100 mg via ORAL
  Filled 2024-05-20 (×4): qty 1

## 2024-05-20 MED ORDER — NICOTINE 14 MG/24HR TD PT24
14.0000 mg | MEDICATED_PATCH | Freq: Every day | TRANSDERMAL | Status: DC
  Administered 2024-05-20 – 2024-05-23 (×4): 14 mg via TRANSDERMAL
  Filled 2024-05-20 (×5): qty 1

## 2024-05-20 MED ORDER — TRAMADOL HCL 50 MG PO TABS
50.0000 mg | ORAL_TABLET | Freq: Four times a day (QID) | ORAL | Status: DC | PRN
  Administered 2024-05-20 – 2024-05-23 (×8): 50 mg via ORAL
  Filled 2024-05-20 (×8): qty 1

## 2024-05-20 MED ORDER — POTASSIUM CHLORIDE CRYS ER 20 MEQ PO TBCR
40.0000 meq | EXTENDED_RELEASE_TABLET | Freq: Once | ORAL | Status: AC
  Administered 2024-05-20: 40 meq via ORAL
  Filled 2024-05-20: qty 2

## 2024-05-20 MED ORDER — ATORVASTATIN CALCIUM 10 MG PO TABS
10.0000 mg | ORAL_TABLET | Freq: Every evening | ORAL | Status: DC
  Administered 2024-05-20 – 2024-05-22 (×3): 10 mg via ORAL
  Filled 2024-05-20 (×4): qty 1

## 2024-05-20 MED ORDER — PANTOPRAZOLE SODIUM 40 MG PO TBEC
40.0000 mg | DELAYED_RELEASE_TABLET | Freq: Two times a day (BID) | ORAL | Status: DC
  Administered 2024-05-20 – 2024-05-23 (×7): 40 mg via ORAL
  Filled 2024-05-20 (×7): qty 1

## 2024-05-20 MED ORDER — ALLOPURINOL 100 MG PO TABS
100.0000 mg | ORAL_TABLET | Freq: Every day | ORAL | Status: DC
  Administered 2024-05-20 – 2024-05-23 (×4): 100 mg via ORAL
  Filled 2024-05-20 (×4): qty 1

## 2024-05-20 MED ORDER — METOPROLOL TARTRATE 25 MG PO TABS
25.0000 mg | ORAL_TABLET | Freq: Two times a day (BID) | ORAL | Status: DC
  Administered 2024-05-20 – 2024-05-23 (×7): 25 mg via ORAL
  Filled 2024-05-20 (×7): qty 1

## 2024-05-20 MED ORDER — FOLIC ACID 1 MG PO TABS
1.0000 mg | ORAL_TABLET | Freq: Every day | ORAL | Status: DC
  Administered 2024-05-20 – 2024-05-23 (×4): 1 mg via ORAL
  Filled 2024-05-20 (×4): qty 1

## 2024-05-20 MED ORDER — FUROSEMIDE 10 MG/ML IJ SOLN
20.0000 mg | Freq: Once | INTRAMUSCULAR | Status: AC
  Administered 2024-05-20: 20 mg via INTRAVENOUS
  Filled 2024-05-20: qty 2

## 2024-05-20 MED ORDER — ADULT MULTIVITAMIN W/MINERALS CH
1.0000 | ORAL_TABLET | Freq: Every day | ORAL | Status: DC
  Administered 2024-05-20 – 2024-05-23 (×4): 1 via ORAL
  Filled 2024-05-20 (×4): qty 1

## 2024-05-20 MED ORDER — FUROSEMIDE 10 MG/ML IJ SOLN
20.0000 mg | Freq: Two times a day (BID) | INTRAMUSCULAR | Status: DC
  Administered 2024-05-20 – 2024-05-21 (×2): 20 mg via INTRAVENOUS
  Filled 2024-05-20 (×2): qty 2

## 2024-05-20 NOTE — Progress Notes (Signed)
  Echocardiogram 2D Echocardiogram has been performed.  Tony Lopez 05/20/2024, 11:00 AM

## 2024-05-20 NOTE — ED Notes (Signed)
 Meal tray ordered

## 2024-05-20 NOTE — Care Management Obs Status (Signed)
 MEDICARE OBSERVATION STATUS NOTIFICATION   Patient Details  Name: Tony Lopez MRN: [id number] Date of Birth: 12/09/1950   Medicare Observation Status Notification Given:  Yes    [name], RN 05/20/2024, 4:39 PM

## 2024-05-20 NOTE — ED Notes (Signed)
 Pt ambulated to restroom. [place] sandwich provided

## 2024-05-20 NOTE — H&P (Signed)
 History and Physical    Patient: Tony Lopez FMW:[id number] DOB: 25-Jan-1951 DOA: 05/19/2024 DOS: the patient was seen and examined on 05/20/2024 PCP: Alvera Reagin, PA  Patient coming from: SNF  Chief Complaint:  Chief Complaint  Patient presents with   Leg Swelling   HPI: JAMESROBERT Lopez is a 73 y.o. male with medical history significant of HTN, prostate cancer, GERD, alcohol abuse c/b withdrawal/DTs, and recent admission for alcohol withdrawal c/b DT and polymicrobial bacteremia 2/2 acute cholecystitis s/p CSY on 5/13 w/ discharge to rehab who presents today with BLE swelling and DOE, and elevated BNP c/w ADHF.  Pt states that he was in his USOH until he was evaluated by the provider at his rehab facility in anticipation of discharge, and was found at that time to have BLE swelling c/f ADHF; as such, pt was BIBA to ED for evaluation. Pt denies any cardiac history.  In the ED, pt hypertensive and tachypneic on RA. Labs notable for K 3.1, Cr 0.81, and BNP 895. CT chest showed small bilateral pleural effusions with associated bibasilar compressive atelectasis, lipomatous hypertrophy of the intra-atrial septum w/ resultant high-grade narrowing of the superior vena cava at the cavoatrial junction, and fusiform dilation of the ascending aorta measuring 4.1 cm in greatest dimension. Recommend annual imaging followup by CTA or MRA. EDP administered IV lasix  20mg  daily and pt was admitted to medicine for ongoing care.  Review of Systems: As mentioned in the history of present illness. All other systems reviewed and are negative. Past Medical History:  Diagnosis Date   Arthritis    GERD (gastroesophageal reflux disease)    Hepatitis C    Hypertension    Prostate cancer [facility])    Past Surgical History:  Procedure Laterality Date   BACK SURGERY  1999   BILIARY STENT PLACEMENT  04/28/2024   Procedure: INSERTION, STENT, BILE DUCT;  Surgeon: Saintclair Jasper, MD;  Location[facility] ENDOSCOPY;   Service: Gastroenterology;;   CHOLECYSTECTOMY N/A 04/25/2024   Procedure: SUBTOTAL CHOLECYSTECTOMY;  Surgeon: Teresa Lonni HERO, MD;  Location: [facility] OR;  Service: General;  Laterality: N/A;  WITH ICG DYE   CYSTOSCOPY WITH INJECTION N/A 10/05/2017   Procedure: CYSTOSCOPY WITH INJECTION INDOCYANINE GREEN  DYE;  Surgeon: Alvaro Hummer, MD;  Location: [facility] ORS;  Service: Urology;  Laterality: N/A;   ERCP N/A 04/28/2024   Procedure: ERCP, WITH INTERVENTION IF INDICATED;  Surgeon: Saintclair Jasper, MD;  Location: [facility] ENDOSCOPY;  Service: Gastroenterology;  Laterality: N/A;   KNEE SURGERY Left early 90s   CARTILAGE CLEAN OUT    LYMPHADENECTOMY Bilateral 10/05/2017   Procedure: PELVIC LYMPHADENECTOMY;  Surgeon: Alvaro Hummer, MD;  Location: [facility] ORS;  Service: Urology;  Laterality: Bilateral;   ROBOT ASSISTED LAPAROSCOPIC RADICAL PROSTATECTOMY N/A 10/05/2017   Procedure: XI ROBOTIC ASSISTED LAPAROSCOPIC RADICAL PROSTATECTOMY;  Surgeon: Alvaro Hummer, MD;  Location: [facility] ORS;  Service: Urology;  Laterality: N/A;   SPHINCTEROTOMY  04/28/2024   Procedure: SPHINCTEROTOMY,BILIARY;  Surgeon: Saintclair Jasper, MD;  Location: [facility] ENDOSCOPY;  Service: Gastroenterology;;   STONE EXTRACTION WITH BASKET  04/28/2024   Procedure: ERCP, WITH LITHROTRIPSY OR REMOVAL OF COMMON BILE DUCT CALCULUS USING BALLOON;  Surgeon: Saintclair Jasper, MD;  Location: [facility] ENDOSCOPY;  Service: Gastroenterology;;   Social History:  reports that he has been smoking cigarettes. He has a 35 pack-year smoking history. He has never used smokeless tobacco. He reports current alcohol use. He reports that he does not use drugs.  No Known Allergies  Family History  Problem Relation  Age of Onset   Lung cancer Mother    Prostate cancer Neg Hx     Prior to Admission medications   Medication Sig Start Date End Date Taking? Authorizing Provider  allopurinol  (ZYLOPRIM ) 100 MG tablet Take 100 mg by mouth daily.   Yes [provider]  atorvastatin (LIPITOR) 10 MG  tablet Take 10 mg by mouth every evening.   Yes [provider]  folic acid  (FOLVITE ) 1 MG tablet Take 1 tablet (1 mg total) by mouth daily. 05/02/24  Yes Singh, Prashant K, MD  metoprolol  tartrate (LOPRESSOR ) 25 MG tablet Take 1 tablet (25 mg total) by mouth 2 (two) times daily. 05/02/24  Yes Singh, Prashant K, MD  Multiple Vitamin (MULTIVITAMIN WITH MINERALS) TABS tablet Take 1 tablet by mouth daily. 05/02/24  Yes Singh, Prashant K, MD  nicotine  (NICODERM CQ  - DOSED IN MG/24 HOURS) 21 mg/24hr patch Place 1 patch (21 mg total) onto the skin daily. 05/02/24  Yes Dennise Lavada POUR, MD  pantoprazole  (PROTONIX ) 40 MG tablet Take 1 tablet (40 mg total) by mouth 2 (two) times daily. 05/02/24  Yes Singh, Prashant K, MD  thiamine  (VITAMIN B-1) 100 MG tablet Take 1 tablet (100 mg total) by mouth daily. 05/02/24  Yes Singh, Prashant K, MD  traMADol  (ULTRAM ) 50 MG tablet Take 1 tablet (50 mg total) by mouth every 6 (six) hours as needed. 05/02/24 05/02/25 Yes Singh, Prashant K, MD  amoxicillin -clavulanate (AUGMENTIN ) 875-125 MG tablet Take 1 tablet by mouth every 12 (twelve) hours. Patient not taking: Reported on 05/20/2024 05/02/24   Singh, Prashant K, MD  levofloxacin  (LEVAQUIN ) 750 MG tablet Take 1 tablet (750 mg total) by mouth daily. Patient not taking: Reported on 05/20/2024 05/02/24   Singh, Prashant K, MD  potassium chloride  (KLOR-CON ) 10 MEQ tablet Take 10 mEq by mouth daily. Patient not taking: Reported on 05/20/2024 05/08/24   [provider]    Physical Exam: Vitals:   05/20/24 0800 05/20/24 0900 05/20/24 1000 05/20/24 1052  BP: (!) 175/76 136/81 (!) 172/85   Pulse: 72 76 77 72  Resp: 18 12 15 18   Temp:    98.6 F (37 C)  TempSrc:      SpO2: 100% 100% 100% 100%   General: Alert, oriented x3, resting comfortably in no acute distress Respiratory: Bibasilar rales; no wheezing Cardiovascular: Regular rate and rhythm w/o m/r/g MSK: BLE swelling to knees   Data Reviewed:  Lab Results   Component Value Date   WBC 5.9 05/19/2024   HGB 11.0 (L) 05/19/2024   HCT 34.1 (L) 05/19/2024   MCV 100.9 (H) 05/19/2024   PLT 263 05/19/2024   Lab Results  Component Value Date   GLUCOSE 102 (H) 05/19/2024   CALCIUM  8.2 (L) 05/19/2024   NA 140 05/19/2024   K 3.1 (L) 05/19/2024   CO2 24 05/19/2024   CL 103 05/19/2024   BUN <5 (L) 05/19/2024   CREATININE 0.81 05/19/2024   Lab Results  Component Value Date   ALT 24 05/19/2024   AST 28 05/19/2024   ALKPHOS 128 (H) 05/19/2024   BILITOT 0.7 05/19/2024   Lab Results  Component Value Date   INR 1.1 04/28/2024   INR 0.9 04/18/2024    Radiology: CT Chest Wo Contrast Result Date: 05/20/2024 CLINICAL DATA:  Abnormal chest x-ray, pulmonary nodule EXAM: CT CHEST WITHOUT CONTRAST TECHNIQUE: Multidetector CT imaging of the chest was performed following the standard protocol without IV contrast. RADIATION DOSE REDUCTION: This exam was performed according  to the departmental dose-optimization program which includes automated exposure control, adjustment of the mA and/or kV according to patient size and/or use of iterative reconstruction technique. COMPARISON:  None Available. FINDINGS: Cardiovascular: No significant coronary artery calcification. Lipomatous hypertrophy of the intra-atrial septum. Resultant high-grade narrowing of the superior vena cava at the cavoatrial junction (107/3). Global cardiac size within normal limits. No pericardial effusion. Central pulmonary arteries are of normal caliber. There is fusiform dilation of the ascending aorta measuring 4.1 cm in greatest dimension. Descending thoracic aorta is of normal caliber. Mild superimposed atherosclerotic calcification. Mediastinum/Nodes: No enlarged mediastinal or axillary lymph nodes. Thyroid gland, trachea, and esophagus demonstrate no significant findings. Lungs/Pleura: Small bilateral pleural effusions with associated bibasilar compressive atelectasis. Mild emphysema. Bronchial  wall thickening in keeping with airway inflammation. Opacity seen at the left lung base corresponds to a small area of parenchymal scarring within basilar lingula. No focal pulmonary nodule or infiltrate. No pneumothorax. No central obstructing lesion. Upper Abdomen: At least moderate hepatic steatosis. Internal biliary stent is partially visualized. Pneumobilia present suggesting patency of the stented segment. No acute abnormality. Musculoskeletal: No chest wall mass or suspicious bone lesions identified. IMPRESSION: 1. Small bilateral pleural effusions with associated bibasilar compressive atelectasis. 2. Mild emphysema.  Airway inflammation. 3. Lipomatous hypertrophy of the intra-atrial septum. Resultant high-grade narrowing of the superior vena cava at the cavoatrial junction. 4. Fusiform dilation of the ascending aorta measuring 4.1 cm in greatest dimension. Recommend annual imaging followup by CTA or MRA. This recommendation follows 2010 ACCF/AHA/AATS/ACR/ASA/SCA/SCAI/SIR/STS/SVM Guidelines for the Diagnosis and Management of Patients with Thoracic Aortic Disease. Circulation. 2010; 121: Z733-z630. Aortic aneurysm NOS (ICD10-I71.9) 5. Moderate hepatic steatosis. 6. Internal biliary stent is partially visualized. Pneumobilia present suggesting patency of the stented segment. Aortic Atherosclerosis (ICD10-I70.0) and Emphysema (ICD10-J43.9). Electronically Signed   By: Dorethia Molt M.D.   On: 05/20/2024 02:49   VAS US  LOWER EXTREMITY VENOUS (DVT) (ONLY [facility] & [facility]) Result Date: 05/19/2024  Lower Venous DVT Study Patient Name:  EGOR FULLILOVE  Date of Exam:   05/19/2024 Medical Rec #: [id number]           Accession #:    [id number] Date of Birth: 07/04/1951          Patient Gender: M Patient Age:   57 years Exam Location:  [facility] Procedure:      VAS US  LOWER EXTREMITY VENOUS (DVT) Referring Phys: [place] --------------------------------------------------------------------------------   Indications: Edema.  Performing Technologist: Elmarie Lindau, RVT  Examination Guidelines: A complete evaluation includes B-mode imaging, spectral Doppler, color Doppler, and power Doppler as needed of all accessible portions of each vessel. Bilateral testing is considered an integral part of a complete examination. Limited examinations for reoccurring indications may be performed as noted. The reflux portion of the exam is performed with the patient in reverse Trendelenburg.  +-----+---------------+---------+-----------+----------+--------------+ RIGHTCompressibilityPhasicitySpontaneityPropertiesThrombus Aging +-----+---------------+---------+-----------+----------+--------------+ CFV  Full           Yes      Yes                                 +-----+---------------+---------+-----------+----------+--------------+   +---------+---------------+---------+-----------+----------+-------------------+ LEFT     CompressibilityPhasicitySpontaneityPropertiesThrombus Aging      +---------+---------------+---------+-----------+----------+-------------------+ CFV      Full           Yes      Yes                                      +---------+---------------+---------+-----------+----------+-------------------+  SFJ      Full                                                             +---------+---------------+---------+-----------+----------+-------------------+ FV Prox  Full                                                             +---------+---------------+---------+-----------+----------+-------------------+ FV Mid   Full                                                             +---------+---------------+---------+-----------+----------+-------------------+ FV DistalFull                                                             +---------+---------------+---------+-----------+----------+-------------------+ PFV      Full                                                              +---------+---------------+---------+-----------+----------+-------------------+ POP      Full           Yes      Yes                                      +---------+---------------+---------+-----------+----------+-------------------+ PTV      Full                                                             +---------+---------------+---------+-----------+----------+-------------------+ PERO                                                  Not well visualized +---------+---------------+---------+-----------+----------+-------------------+ The peroneal veins are not seen well.     Summary: RIGHT: - No evidence of common femoral vein obstruction.   LEFT: - There is no evidence of deep vein thrombosis in the lower extremity. However, portions of this examination were limited- see technologist comments above.  - No cystic structure found in the popliteal fossa.  *See table(s) above for measurements and observations. Electronically signed by Lonni Gaskins MD on 05/19/2024 at 6:11:48 PM.    Final    UE  Venous Duplex ([facility] and [facility] ONLY) Result Date: 05/19/2024 UPPER VENOUS STUDY  Patient Name:  GADGE HERMIZ  Date of Exam:   05/19/2024 Medical Rec #: [id number]           Accession #:    [id number] Date of Birth: Nov 24, 1951          Patient Gender: M Patient Age:   26 years Exam Location:  [facility] [place] Procedure:      VAS US  UPPER EXTREMITY VENOUS DUPLEX Referring Phys: R[place] --------------------------------------------------------------------------------  Indications: Edema Performing Technologist: Elmarie Lindau, RVT  Examination Guidelines: A complete evaluation includes B-mode imaging, spectral Doppler, color Doppler, and power Doppler as needed of all accessible portions of each vessel. Bilateral testing is considered an integral part of a complete examination. Limited examinations for reoccurring indications may be performed as noted.  Right  Findings: +----------+------------+---------+-----------+----------+-------+ RIGHT     CompressiblePhasicitySpontaneousPropertiesSummary +----------+------------+---------+-----------+----------+-------+ Subclavian               Yes                                +----------+------------+---------+-----------+----------+-------+  Left Findings: +----------+------------+---------+-----------+----------+-------+ LEFT      CompressiblePhasicitySpontaneousPropertiesSummary +----------+------------+---------+-----------+----------+-------+ IJV           Full       Yes       Yes                      +----------+------------+---------+-----------+----------+-------+ Subclavian               Yes                                +----------+------------+---------+-----------+----------+-------+ Axillary      Full                                          +----------+------------+---------+-----------+----------+-------+ Brachial      Full                                          +----------+------------+---------+-----------+----------+-------+ Radial        Full                                          +----------+------------+---------+-----------+----------+-------+ Ulnar         Full                                          +----------+------------+---------+-----------+----------+-------+ Cephalic      Full                                          +----------+------------+---------+-----------+----------+-------+ Basilic       Full                                          +----------+------------+---------+-----------+----------+-------+  Summary:  Right: No evidence of thrombosis in the subclavian.  Left: No evidence of deep vein thrombosis in the upper extremity.  *See table(s) above for measurements and observations.  Diagnosing physician: Lonni Gaskins MD Electronically signed by Lonni Gaskins MD on 05/19/2024 at 6:11:31 PM.    Final     DG Chest 2 View Result Date: 05/19/2024 CLINICAL DATA:  Edema EXAM: CHEST - 2 VIEW COMPARISON:  Chest x-ray 04/21/2024 FINDINGS: There is some strandy opacities in the retrocardiac region. There is a focal opacity near the left costophrenic angle, slightly nodular. Small pleural effusions are present bilaterally. The cardiomediastinal silhouette is within normal limits. There are questionable calcified right hilar lymph nodes. There is no pneumothorax or acute fracture. IMPRESSION: 1. Small bilateral pleural effusions. 2. Strandy opacities in the retrocardiac region may represent atelectasis or infection. 3. Focal opacity near the left costophrenic angle, slightly nodular. Recommend further evaluation with CT. Electronically Signed   By: Greig Pique M.D.   On: 05/19/2024 15:54    Assessment and Plan: 49M h/o HTN, prostate cancer, GERD, alcohol abuse c/b withdrawal/DTs, and recent admission for alcohol withdrawal c/b DT and polymicrobial bacteremia 2/2 acute cholecystitis s/p CSY on 5/13 w/ discharge to rehab who presents today with BLE swelling and DOE, and elevated BNP c/w ADHF.  BLE swelling Elevated BNP Presumed ADHF -IV lasix  20mg  BID for now; goal net neg 1-2L/d; strict I/Os; daily standing weights; K>4/Mg>2 -F/u TTE  Mild emphysema on CT -OP Pulm evaluation on discharge  Abnl Chest CT 3. Lipomatous hypertrophy of the intra-atrial septum. Resultant high-grade narrowing of the superior vena cava at the cavoatrial junction. 4. Fusiform dilation of the ascending aorta measuring 4.1 cm in greatest dimension. Recommend annual imaging followup by CTA or MRA. This recommendation follows 2010 ACCF/AHA/AATS/ACR/ASA/SCA/SCAI/SIR/STS/SVM Guidelines for the Diagnosis and Management of Patients with Thoracic Aortic Disease. Circulation. 2010; 121: Z733-z630. Aortic aneurysm NOS (ICD10-I71.9) -Consider holter monitor at discharge given lipomatous hypertrophy of intra-atrial septum which is a/w  arrhythmias and sudden death -F/u TTE and consider cardiology consult  H/o EtOH abuse Pt has been admitted at rehab the past month -Defer CIWA for now   Advance Care Planning:   Code Status: Full Code   Consults: N/A  Family Communication: N/A  Severity of Illness: The appropriate patient status for this patient is INPATIENT. Inpatient status is judged to be reasonable and necessary in order to provide the required intensity of service to ensure the patient's safety. The patient's presenting symptoms, physical exam findings, and initial radiographic and laboratory data in the context of their chronic comorbidities is felt to place them at high risk for further clinical deterioration. Furthermore, it is not anticipated that the patient will be medically stable for discharge from the hospital within 2 midnights of admission.   * I certify that at the point of admission it is my clinical judgment that the patient will require inpatient hospital care spanning beyond 2 midnights from the point of admission due to high intensity of service, high risk for further deterioration and high frequency of surveillance required.*   ------- I spent 55 minutes reviewing previous labs/notes, obtaining separate history at the bedside, counseling/discussing the treatment plan outlined above, ordering medications/tests, and performing clinical documentation.  Author: Marsha Ada, MD 05/20/2024 11:09 AM  For on call review [email or web address].

## 2024-05-20 NOTE — ED Notes (Signed)
 Lunch tray at bedside. ?

## 2024-05-20 NOTE — ED Provider Notes (Signed)
 [facility] DEPT [facility] Emergency Department Provider Note MRN:  [id number]  Arrival date & time: 05/20/24     Chief Complaint   Leg Swelling   History of Present Illness   Tony Lopez is a 73 y.o. year-old male with a history of HTN, prostate cancer presenting to the ED with chief complaint of leg swelling.  Patient overall feeling normal on Friday.  Woke up on Saturday and noticed his left arm was swollen.  Today noticing shortness of breath with short ambulation which is abnormal for him.  Denies chest pain, no fever, no cough.  Review of Systems  A thorough review of systems was obtained and all systems are negative except as noted in the HPI and PMH.   Patient's Health History    Past Medical History:  Diagnosis Date   Arthritis    GERD (gastroesophageal reflux disease)    Hepatitis C    Hypertension    Prostate cancer [facility])     Past Surgical History:  Procedure Laterality Date   BACK SURGERY  1999   BILIARY STENT PLACEMENT  04/28/2024   Procedure: INSERTION, STENT, BILE DUCT;  Surgeon: Saintclair Jasper, MD;  Location: [facility] ENDOSCOPY;  Service: Gastroenterology;;   CHOLECYSTECTOMY N/A 04/25/2024   Procedure: SUBTOTAL CHOLECYSTECTOMY;  Surgeon: Teresa Lonni HERO, MD;  Location: [facility] OR;  Service: General;  Laterality: N/A;  WITH ICG DYE   CYSTOSCOPY WITH INJECTION N/A 10/05/2017   Procedure: CYSTOSCOPY WITH INJECTION INDOCYANINE GREEN  DYE;  Surgeon: Alvaro Hummer, MD;  Location: [facility] ORS;  Service: Urology;  Laterality: N/A;   ERCP N/A 04/28/2024   Procedure: ERCP, WITH INTERVENTION IF INDICATED;  Surgeon: Saintclair Jasper, MD;  Location[facility] END[facility]COPY;  Service: Gastroenterology;  Laterality: N/A;   KNEE SURGERY Left early 90s   CARTILAGE CLEAN OUT    LYMPHADENECTOMY Bilateral 10/05/2017   Procedure: PELVIC LYMPHADENECTOMY;  Surgeon: Alvaro Hummer, MD;  Location: [facility] ORS;  Service: Urology;  Laterality: Bilateral;   ROBOT ASSISTED LAPAROSCOPIC RADICAL PROSTATECTOMY  N/A 10/05/2017   Procedure: XI ROBOTIC ASSISTED LAPAROSCOPIC RADICAL PROSTATECTOMY;  Surgeon: Alvaro Hummer, MD;  Location: [facility] ORS;  Service: Urology;  Laterality: N/A;   SPHINCTEROTOMY  04/28/2024   Procedure: SPHINCTEROTOMY,BILIARY;  Surgeon: Saintclair Jasper, MD;  Location: [facility] ENDOSCOPY;  Service: Gastroenterology;;   STONE EXTRACTION WITH BASKET  04/28/2024   Procedure: ERCP, WITH LITHROTRIPSY OR REMOVAL OF COMMON BILE DUCT CALCULUS USING BALLOON;  Surgeon: Saintclair Jasper, MD;  Location: [facility] ENDOSCOPY;  Service: Gastroenterology;;    Family History  Problem Relation Age of Onset   Lung cancer Mother    Prostate cancer Neg Hx     Social History   Socioeconomic History   Marital status: Legally Separated    Spouse name: Not on file   Number of children: Not on file   Years of education: Not on file   Highest education level: Not on file  Occupational History   Not on file  Tobacco Use   Smoking status: Every Day    Current packs/day: 1.00    Average packs/day: 1 pack/day for 35.0 years (35.0 ttl pk-yrs)    Types: Cigarettes   Smokeless tobacco: Never  Substance and Sexual Activity   Alcohol use: Yes    Comment: SELDOM    Drug use: No   Sexual activity: Not on file  Other Topics Concern   Not on file  Social History Narrative   Not on file   Social Drivers of Health   Financial Resource Strain:  Not on file  Food Insecurity: No Food Insecurity (04/19/2024)   Hunger Vital Sign    Worried About Running Out of Food in the Last Year: Never true    Ran Out of Food in the Last Year: Never true  Transportation Needs: No Transportation Needs (04/19/2024)   PRAPARE - Administrator, Civil Service (Medical): No    Lack of Transportation (Non-Medical): No  Physical Activity: Not on file  Stress: Not on file  Social Connections: Not on file  Intimate Partner Violence: Not At Risk (04/19/2024)   Humiliation, Afraid, Rape, and Kick questionnaire    Fear of Current or Ex-Partner:  No    Emotionally Abused: No    Physically Abused: No    Sexually Abused: No     Physical Exam   Vitals:   05/19/24 2012 05/19/24 2237  BP: (!) 182/85 (!) 160/109  Pulse: 69 76  Resp: 14 17  Temp: 98.1 F (36.7 C) 98 F (36.7 C)  SpO2: 98% 100%    CONSTITUTIONAL: Well-appearing, NAD NEURO/PSYCH:  Alert and oriented x 3, no focal deficits EYES:  eyes equal and reactive ENT/NECK:  no LAD, no JVD CARDIO: Regular rate, well-perfused, normal S1 and S2 PULM:  CTAB no wheezing or rhonchi GI/GU:  non-distended, non-tender MSK/SPINE:  No gross deformities, pitting edema to lower extremities as well as dependent portions of upper extremities SKIN:  no rash, atraumatic   *Additional and/or pertinent findings included in MDM below  Diagnostic and Interventional Summary    EKG Interpretation Date/Time:  Monday May 19 2024 14:39:46 EDT Ventricular Rate:  64 PR Interval:    QRS Duration:  98 QT Interval:  438 QTC Calculation: 451 R Axis:   67  Text Interpretation: Atrial fibrillation Possible Anterior infarct , age undetermined Abnormal ECG When compared with ECG of 19-Apr-2024 14:42, PREVIOUS ECG IS PRESENT Confirmed by Theadore Sharper [phone]) on 05/20/2024 1:51:45 AM       Labs Reviewed  BRAIN NATRIURETIC PEPTIDE - Abnormal; Notable for the following components:      Result Value   B Natriuretic Peptide 895.8 (*)    All other components within normal limits  COMPREHENSIVE METABOLIC PANEL WITH GFR - Abnormal; Notable for the following components:   Potassium 3.1 (*)    Glucose, Bld 102 (*)    BUN <5 (*)    Calcium  8.2 (*)    Total Protein 5.7 (*)    Albumin  2.5 (*)    Alkaline Phosphatase 128 (*)    All other components within normal limits  CBC WITH DIFFERENTIAL/PLATELET - Abnormal; Notable for the following components:   RBC 3.38 (*)    Hemoglobin 11.0 (*)    HCT 34.1 (*)    MCV 100.9 (*)    All other components within normal limits    VAS US  LOWER EXTREMITY  VENOUS (DVT) (ONLY [facility])  Final Result    UE Venous Duplex ([facility] ONLY)  Final Result    DG Chest 2 View  Final Result    CT Chest Wo Contrast    (Results Pending)    Medications  furosemide  (LASIX ) injection 20 mg (has no administration in time range)  potassium chloride  SA (KLOR-CON  M) CR tablet 40 mEq (has no administration in time range)     Procedures  /  Critical Care Procedures  ED Course and Medical Decision Making  Initial Impression and Ddx Differential diagnosis includes CHF, liver or renal impairment.  Past medical/surgical  history that increases complexity of ED encounter: Recent cholecystitis complicated by bacteremia  Interpretation of Diagnostics I personally reviewed the EKG and my interpretation is as follows: Sinus rhythm  No significant blood count or electrolyte disturbance.  Mild hypokalemia.  BNP elevated.  Patient Reassessment and Ultimate Disposition/Management     History and diagnostics would suggest a new onset CHF.  Per nursing patient quite symptomatic during brief ambulation here in the emergency department.  Will request hospitalist admission.  Patient management required discussion with the following services or consulting groups:  Hospitalist Service  Complexity of Problems Addressed Acute illness or injury that poses threat of life of bodily function  Additional Data Reviewed and Analyzed Further history obtained from: Recent discharge summary  Additional Factors Impacting ED Encounter Risk Consideration of hospitalization  Ozell HERO. Theadore, MD [facility] Emergency Medicine [facility] [email or web address]  Final Clinical Impressions(s) / ED Diagnoses     ICD-10-CM   1. Acute congestive heart failure, unspecified heart failure type (HCC)  I50.9       ED Discharge Orders     None        Discharge Instructions Discussed with and Provided to Patient:   Discharge Instructions   None      Theadore Ozell HERO, MD 05/20/24 [phone]

## 2024-05-20 NOTE — ED Notes (Signed)
 Patient transported to CT

## 2024-05-20 NOTE — ED Notes (Signed)
Attempted to obtain labs, unsuccessful.

## 2024-05-20 NOTE — TOC CM/SW Note (Signed)
 Transition of Care [facility]) - Inpatient Brief Assessment   Patient Details  Name: Tony Lopez MRN: [id number] Date of Birth: 1951/03/07  Transition of [facility] CM/SW Contact:    Tony Barnie Rama, RN Phone Number: 05/20/2024, 4:40 PM   Clinical Narrative: From  [facility] but lives home with daughter , has PCP, Tony Lopez and insurance on file, states has no HH services in place at this time or DME at home.  States daughter will transport them home at [employer] and family is support system, states gets medications from [facility] on [address].  Pta self ambulatory . Plan is not go go back to SNF but to go home.    Transition of Care Asessment: Insurance and Status: Insurance coverage has been reviewed Patient has primary care physician: Yes Home environment has been reviewed: home with daughter Prior level of function:: indep Prior/Current Home Services: No current home services Social Drivers of Health Review: SDOH reviewed no interventions necessary Readmission risk has been reviewed: Yes Transition of care needs: no transition of care needs at this time

## 2024-05-20 NOTE — ED Notes (Signed)
 This RN and phlebotomy attempted to obtain labs. Both unsuccessful. Will get another phlebotomist to try

## 2024-05-21 DIAGNOSIS — K219 Gastro-esophageal reflux disease without esophagitis: Secondary | ICD-10-CM | POA: Diagnosis present

## 2024-05-21 DIAGNOSIS — I5031 Acute diastolic (congestive) heart failure: Secondary | ICD-10-CM | POA: Diagnosis present

## 2024-05-21 DIAGNOSIS — J439 Emphysema, unspecified: Secondary | ICD-10-CM | POA: Diagnosis present

## 2024-05-21 DIAGNOSIS — Z79899 Other long term (current) drug therapy: Secondary | ICD-10-CM | POA: Diagnosis not present

## 2024-05-21 DIAGNOSIS — Z801 Family history of malignant neoplasm of trachea, bronchus and lung: Secondary | ICD-10-CM | POA: Diagnosis not present

## 2024-05-21 DIAGNOSIS — M7989 Other specified soft tissue disorders: Secondary | ICD-10-CM | POA: Diagnosis present

## 2024-05-21 DIAGNOSIS — E876 Hypokalemia: Secondary | ICD-10-CM | POA: Diagnosis present

## 2024-05-21 DIAGNOSIS — E8809 Other disorders of plasma-protein metabolism, not elsewhere classified: Secondary | ICD-10-CM | POA: Diagnosis present

## 2024-05-21 DIAGNOSIS — I77810 Thoracic aortic ectasia: Secondary | ICD-10-CM | POA: Diagnosis present

## 2024-05-21 DIAGNOSIS — D174 Benign lipomatous neoplasm of intrathoracic organs: Secondary | ICD-10-CM | POA: Diagnosis present

## 2024-05-21 DIAGNOSIS — M542 Cervicalgia: Secondary | ICD-10-CM | POA: Diagnosis present

## 2024-05-21 DIAGNOSIS — Z9049 Acquired absence of other specified parts of digestive tract: Secondary | ICD-10-CM | POA: Diagnosis not present

## 2024-05-21 DIAGNOSIS — J9811 Atelectasis: Secondary | ICD-10-CM | POA: Diagnosis present

## 2024-05-21 DIAGNOSIS — Z8546 Personal history of malignant neoplasm of prostate: Secondary | ICD-10-CM | POA: Diagnosis not present

## 2024-05-21 DIAGNOSIS — F17210 Nicotine dependence, cigarettes, uncomplicated: Secondary | ICD-10-CM | POA: Diagnosis present

## 2024-05-21 DIAGNOSIS — F1011 Alcohol abuse, in remission: Secondary | ICD-10-CM | POA: Diagnosis present

## 2024-05-21 DIAGNOSIS — I493 Ventricular premature depolarization: Secondary | ICD-10-CM | POA: Diagnosis present

## 2024-05-21 DIAGNOSIS — I080 Rheumatic disorders of both mitral and aortic valves: Secondary | ICD-10-CM | POA: Diagnosis present

## 2024-05-21 DIAGNOSIS — E8770 Fluid overload, unspecified: Secondary | ICD-10-CM | POA: Diagnosis present

## 2024-05-21 DIAGNOSIS — I110 Hypertensive heart disease with heart failure: Secondary | ICD-10-CM | POA: Diagnosis present

## 2024-05-21 DIAGNOSIS — K760 Fatty (change of) liver, not elsewhere classified: Secondary | ICD-10-CM | POA: Diagnosis present

## 2024-05-21 DIAGNOSIS — Z8619 Personal history of other infectious and parasitic diseases: Secondary | ICD-10-CM | POA: Diagnosis not present

## 2024-05-21 LAB — BASIC METABOLIC PANEL WITH GFR
Anion gap: 10 (ref 5–15)
BUN: <5 mg/dL — ABNORMAL LOW (ref 8–23)
CO2: 27 mmol/L (ref 22–32)
Calcium: 7.9 mg/dL — ABNORMAL LOW (ref 8.9–10.3)
Chloride: 104 mmol/L (ref 98–111)
Creatinine, Ser: 0.76 mg/dL (ref 0.61–1.24)
GFR, Estimated: >60 mL/min (ref >60)
Glucose, Bld: 89 mg/dL (ref 70–99)
Potassium: 3.5 mmol/L (ref 3.5–5.1)
Sodium: 141 mmol/L (ref 135–145)

## 2024-05-21 MED ORDER — FUROSEMIDE 10 MG/ML IJ SOLN
40.0000 mg | Freq: Two times a day (BID) | INTRAMUSCULAR | Status: DC
  Administered 2024-05-21 – 2024-05-23 (×4): 40 mg via INTRAVENOUS
  Filled 2024-05-21 (×4): qty 4

## 2024-05-21 MED ORDER — POTASSIUM CHLORIDE CRYS ER 20 MEQ PO TBCR
40.0000 meq | EXTENDED_RELEASE_TABLET | Freq: Two times a day (BID) | ORAL | Status: DC
  Administered 2024-05-21: 40 meq via ORAL
  Filled 2024-05-21: qty 2

## 2024-05-21 MED ORDER — SPIRONOLACTONE 25 MG PO TABS
25.0000 mg | ORAL_TABLET | Freq: Every day | ORAL | Status: DC
  Administered 2024-05-21 – 2024-05-23 (×3): 25 mg via ORAL
  Filled 2024-05-21 (×3): qty 1

## 2024-05-21 NOTE — Progress Notes (Signed)
 PROGRESS NOTE    Tony Lopez  FMW:[id number] DOB: 1951-07-18 DOA: 05/19/2024 PCP: Alvera Reagin, PA   72/M with history of hypertension, prostate CA, EtOH use, recently admitted with acute cholecystitis, choledocholithiasis, bacteremia complicated by EtOH withdrawal, treated with supportive care, antibiotics, underwent percutaneous cholecystostomy drain on 5/13 and subsequently discharged to rehab few weeks ago. -Admitted with worsening edema from rehab, mild dyspnea on exertion -In the ER hypertensive, tachypneic, creatinine 0.8, BNP 895, CT chest with small bilateral effusions, lipomatous hypertrophy of the intra-atrial septum with narrowing of the superior vena cava at the cavoatrial junction and fusiform dilation of the ascending aorta to 4cm  Subjective: -Feels better, breathing is improving, swelling starting to improve, wondering when he can go home  Assessment and Plan:  Acute diastolic CHF, new diagnosis Hypoalbuminemia with third spacing also contributing -2D echo this morning noted EF of 60-65%, normal RV, no valvular disease - echo does not suggest intra atrial septal hypertrophy as suggested on CT chest yesterday - Continue IV Lasix  today increase dose to 40 Mg twice daily, add Aldactone, continue metoprolol  - Monitor I's/O, daily weights, BMP in a.m. - Increase activity, PT eval  History of EtOH use - Recent hospitalization with EtOH withdrawal, denies recent alcohol use but he has been in rehab for last few weeks  History of cholecystitis, choledocholithiasis Polymicrobial bacteremia -Completed course of antibiotics, treated with ERCP by [facility] on 6/2, CBD stent placed - Status post percutaneous cholecystostomy 5/13, this was subsequently removed by IR - Follow-up with GI in  Tobacco abuse Counseled    DVT prophylaxis: Lovenox Code Status: Full code Family Communication: None present Disposition Plan: Home likely 48 hours  Objective: Vitals:    05/20/24 2202 05/21/24 0014 05/21/24 0428 05/21/24 0730  BP: (!) 151/71 (!) 142/66 (!) 165/63 (!) 153/75  Pulse: 74 73 64 81  Resp:  16 16 18   Temp:  98.3 F (36.8 C) 98.1 F (36.7 C) 97.7 F (36.5 C)  TempSrc:  Oral Oral Oral  SpO2:  97% 93% 92%  Weight:   88 kg     Intake/Output Summary (Last 24 hours) at 05/21/2024 1056 Last data filed at 05/21/2024 0852 Gross per 24 hour  Intake 240 ml  Output --  Net 240 ml   Filed Weights   05/21/24 0428  Weight: 88 kg    Examination:  General exam: Appears calm and comfortable  HEENT: Positive JVD Respiratory system: Creased breath sounds at the bases Cardiovascular system: S1 & S2 heard, RRR.  Abd: nondistended, soft and nontender.Normal bowel sounds heard. Central nervous system: Alert and oriented. No focal neurological deficits. Extremities: 2+ edema Skin: No rashes Psychiatry:  Mood & affect appropriate.     Data Reviewed:   CBC: Recent Labs  Lab 05/19/24 1531  WBC 5.9  NEUTROABS 3.9  HGB 11.0*  HCT 34.1*  MCV 100.9*  PLT 263   Basic Metabolic Panel: Recent Labs  Lab 05/19/24 1531 05/21/24 0306  NA 140 141  K 3.1* 3.5  CL 103 104  CO2 24 27  GLUCOSE 102* 89  BUN <5* <5*  CREATININE 0.81 0.76  CALCIUM  8.2* 7.9*   GFR: Estimated Creatinine Clearance: 93 mL/min (by C-G formula based on SCr of 0.76 mg/dL). Liver Function Tests: Recent Labs  Lab 05/19/24 1531  AST 28  ALT 24  ALKPHOS 128*  BILITOT 0.7  PROT 5.7*  ALBUMIN  2.5*   No results for input(s): LIPASE, AMYLASE in the last 168 hours. No results  for input(s): AMMONIA in the last 168 hours. Coagulation Profile: No results for input(s): INR, PROTIME in the last 168 hours. Cardiac Enzymes: No results for input(s): CKTOTAL, CKMB, CKMBINDEX, TROPONINI in the last 168 hours. BNP (last 3 results) No results for input(s): PROBNP in the last 8760 hours. HbA1C: No results for input(s): HGBA1C in the last 72  hours. CBG: Recent Labs  Lab 05/20/24 0750  GLUCAP 88   Lipid Profile: No results for input(s): CHOL, HDL, LDLCALC, TRIG, CHOLHDL, LDLDIRECT in the last 72 hours. Thyroid Function Tests: No results for input(s): TSH, T4TOTAL, FREET4, T3FREE, THYROIDAB in the last 72 hours. Anemia Panel: No results for input(s): VITAMINB12, FOLATE, FERRITIN, TIBC, IRON, RETICCTPCT in the last 72 hours. Urine analysis:    Component Value Date/Time   COLORURINE AMBER (A) 04/21/2024 1114   APPEARANCEUR CLOUDY (A) 04/21/2024 1114   LABSPEC 1.027 04/21/2024 1114   PHURINE 5.0 04/21/2024 1114   GLUCOSEU NEGATIVE 04/21/2024 1114   HGBUR MODERATE (A) 04/21/2024 1114   BILIRUBINUR NEGATIVE 04/21/2024 1114   KETONESUR 5 (A) 04/21/2024 1114   PROTEINUR 30 (A) 04/21/2024 1114   NITRITE NEGATIVE 04/21/2024 1114   LEUKOCYTESUR NEGATIVE 04/21/2024 1114   Sepsis Labs: @LABRCNTIP (procalcitonin:4,lacticidven:4)  )No results found for this or any previous visit (from the past 240 hours).   Radiology Studies: ECHOCARDIOGRAM LIMITED Result Date: 05/20/2024    ECHOCARDIOGRAM LIMITED REPORT   Patient Name:   Tony Lopez Date of Exam: 05/20/2024 Medical Rec #:  [id number]          Height:       72.0 in Accession #:    [id number]         Weight:       194.7 lb Date of Birth:  02-Jan-1951         BSA:          2.106 m Patient Age:    72 years           BP:           172/85 mmHg Patient Gender: M                  HR:           75 bpm. Exam Location:  Inpatient Procedure: Limited Echo, Cardiac Doppler and Color Doppler (Both Spectral and            Color Flow Doppler were utilized during procedure). Indications:    I50.40* Unspecified combined systolic (congestive) and diastolic                 (congestive) heart failure. Elevated BNP  History:        Patient has prior history of Echocardiogram examinations, most                 recent 04/28/2024. Abnormal ECG, Arrythmias:Tachycardia,                  Signs/Symptoms:Bacteremia and Edema; Risk Factors:Hypertension.                 Cancer. ETOH.  Sonographer:    Ellouise Mose RDCS Referring Phys: [id number] [employer]  Sonographer Comments: Technically difficult study due to poor echo windows. IMPRESSIONS  1. Left ventricular ejection fraction, by estimation, is 60 to 65%. The left ventricle has normal function. The left ventricle has no regional wall motion abnormalities. Left ventricular diastolic parameters are indeterminate.  2. Right ventricular systolic function is normal. The right ventricular size is  mildly enlarged. Tricuspid regurgitation signal is inadequate for assessing PA pressure.  3. The mitral valve is grossly normal. Mild mitral valve regurgitation.  4. The aortic valve was not well visualized. Aortic valve regurgitation is mild.  5. Aortic dilatation noted. There is mild dilatation of the ascending aorta, measuring 42 mm.  6. The inferior vena cava is normal in size with greater than 50% respiratory variability, suggesting right atrial pressure of 3 mmHg. Comparison(s): No significant change from prior study. Prior images reviewed side by side. FINDINGS  Left Ventricle: Left ventricular ejection fraction, by estimation, is 60 to 65%. The left ventricle has normal function. The left ventricle has no regional wall motion abnormalities. The left ventricular internal cavity size was normal in size. There is  no left ventricular hypertrophy. Left ventricular diastolic parameters are indeterminate. Right Ventricle: The right ventricular size is mildly enlarged. Right ventricular systolic function is normal. Tricuspid regurgitation signal is inadequate for assessing PA pressure. Mitral Valve: The mitral valve is grossly normal. Mild mitral valve regurgitation. Tricuspid Valve: The tricuspid valve is normal in structure. Tricuspid valve regurgitation is not demonstrated. No evidence of tricuspid stenosis. Aortic Valve: The aortic valve was not well  visualized. Aortic valve regurgitation is mild. Aorta: Aortic dilatation noted. There is mild dilatation of the ascending aorta, measuring 42 mm. Venous: The inferior vena cava is normal in size with greater than 50% respiratory variability, suggesting right atrial pressure of 3 mmHg. Additional Comments: Spectral Doppler performed. Color Doppler performed.  LEFT VENTRICLE PLAX 2D LVIDd:         4.80 cm LVIDs:         3.10 cm LV PW:         2.60 cm LV IVS:        1.10 cm  LV Volumes (MOD) LV vol d, MOD A2C: 89.1 ml LV vol d, MOD A4C: 97.2 ml LV vol s, MOD A2C: 32.4 ml LV vol s, MOD A4C: 39.4 ml LV SV MOD A2C:     56.7 ml LV SV MOD A4C:     97.2 ml LV SV MOD BP:      58.1 ml IVC IVC diam: 1.80 cm  AORTA Ao Root diam: 4.40 cm Ao Asc diam:  4.13 cm MITRAL VALVE MV Area (PHT): 3.65 cm MV Decel Time: 208 msec MV E velocity: 73.30 cm/s MV A velocity: 107.00 cm/s MV E/A ratio:  0.69 Stanly Leavens MD Electronically signed by Stanly Leavens MD Signature Date/Time: 05/20/2024/11:39:07 AM    Final    CT Chest Wo Contrast Result Date: 05/20/2024 CLINICAL DATA:  Abnormal chest x-ray, pulmonary nodule EXAM: CT CHEST WITHOUT CONTRAST TECHNIQUE: Multidetector CT imaging of the chest was performed following the standard protocol without IV contrast. RADIATION DOSE REDUCTION: This exam was performed according to the departmental dose-optimization program which includes automated exposure control, adjustment of the mA and/or kV according to patient size and/or use of iterative reconstruction technique. COMPARISON:  None Available. FINDINGS: Cardiovascular: No significant coronary artery calcification. Lipomatous hypertrophy of the intra-atrial septum. Resultant high-grade narrowing of the superior vena cava at the cavoatrial junction (107/3). Global cardiac size within normal limits. No pericardial effusion. Central pulmonary arteries are of normal caliber. There is fusiform dilation of the ascending aorta measuring  4.1 cm in greatest dimension. Descending thoracic aorta is of normal caliber. Mild superimposed atherosclerotic calcification. Mediastinum/Nodes: No enlarged mediastinal or axillary lymph nodes. Thyroid gland, trachea, and esophagus demonstrate no significant findings. Lungs/Pleura: Small bilateral pleural effusions with  associated bibasilar compressive atelectasis. Mild emphysema. Bronchial wall thickening in keeping with airway inflammation. Opacity seen at the left lung base corresponds to a small area of parenchymal scarring within basilar lingula. No focal pulmonary nodule or infiltrate. No pneumothorax. No central obstructing lesion. Upper Abdomen: At least moderate hepatic steatosis. Internal biliary stent is partially visualized. Pneumobilia present suggesting patency of the stented segment. No acute abnormality. Musculoskeletal: No chest wall mass or suspicious bone lesions identified. IMPRESSION: 1. Small bilateral pleural effusions with associated bibasilar compressive atelectasis. 2. Mild emphysema.  Airway inflammation. 3. Lipomatous hypertrophy of the intra-atrial septum. Resultant high-grade narrowing of the superior vena cava at the cavoatrial junction. 4. Fusiform dilation of the ascending aorta measuring 4.1 cm in greatest dimension. Recommend annual imaging followup by CTA or MRA. This recommendation follows 2010 ACCF/AHA/AATS/ACR/ASA/SCA/SCAI/SIR/STS/SVM Guidelines for the Diagnosis and Management of Patients with Thoracic Aortic Disease. Circulation. 2010; 121: Z733-z630. Aortic aneurysm NOS (ICD10-I71.9) 5. Moderate hepatic steatosis. 6. Internal biliary stent is partially visualized. Pneumobilia present suggesting patency of the stented segment. Aortic Atherosclerosis (ICD10-I70.0) and Emphysema (ICD10-J43.9). Electronically Signed   By: Dorethia Molt M.D.   On: 05/20/2024 02:49   VAS US  LOWER EXTREMITY VENOUS (DVT) (ONLY [facility] & [facility]) Result Date: 05/19/2024  Lower Venous DVT Study Patient  Name:  MORIAH LOUGHRY  Date of Exam:   05/19/2024 Medical Rec #: [id number]           Accession #:    [id number] Date of Birth: 04-16-1951          Patient Gender: M Patient Age:   79 years Exam Location:  [facility] Procedure:      VAS US  LOWER EXTREMITY VENOUS (DVT) Referring Phys: [place] --------------------------------------------------------------------------------  Indications: Edema.  Performing Technologist: Elmarie Lindau, RVT  Examination Guidelines: A complete evaluation includes B-mode imaging, spectral Doppler, color Doppler, and power Doppler as needed of all accessible portions of each vessel. Bilateral testing is considered an integral part of a complete examination. Limited examinations for reoccurring indications may be performed as noted. The reflux portion of the exam is performed with the patient in reverse Trendelenburg.  +-----+---------------+---------+-----------+----------+--------------+ RIGHTCompressibilityPhasicitySpontaneityPropertiesThrombus Aging +-----+---------------+---------+-----------+----------+--------------+ CFV  Full           Yes      Yes                                 +-----+---------------+---------+-----------+----------+--------------+   +---------+---------------+---------+-----------+----------+-------------------+ LEFT     CompressibilityPhasicitySpontaneityPropertiesThrombus Aging      +---------+---------------+---------+-----------+----------+-------------------+ CFV      Full           Yes      Yes                                      +---------+---------------+---------+-----------+----------+-------------------+ SFJ      Full                                                             +---------+---------------+---------+-----------+----------+-------------------+ FV Prox  Full                                                              +---------+---------------+---------+-----------+----------+-------------------+  FV Mid   Full                                                             +---------+---------------+---------+-----------+----------+-------------------+ FV DistalFull                                                             +---------+---------------+---------+-----------+----------+-------------------+ PFV      Full                                                             +---------+---------------+---------+-----------+----------+-------------------+ POP      Full           Yes      Yes                                      +---------+---------------+---------+-----------+----------+-------------------+ PTV      Full                                                             +---------+---------------+---------+-----------+----------+-------------------+ PERO                                                  Not well visualized +---------+---------------+---------+-----------+----------+-------------------+ The peroneal veins are not seen well.     Summary: RIGHT: - No evidence of common femoral vein obstruction.   LEFT: - There is no evidence of deep vein thrombosis in the lower extremity. However, portions of this examination were limited- see technologist comments above.  - No cystic structure found in the popliteal fossa.  *See table(s) above for measurements and observations. Electronically signed by Lonni Gaskins MD on 05/19/2024 at 6:11:48 PM.    Final    UE Venous Duplex ([facility] and [facility] ONLY) Result Date: 05/19/2024 UPPER VENOUS STUDY  Patient Name:  ADEDAMOLA SETO  Date of Exam:   05/19/2024 Medical Rec #: [id number]           Accession #:    [id number] Date of Birth: July 17, 1951          Patient Gender: M Patient Age:   28 years Exam Location:  [facility] Procedure:      VAS US  UPPER EXTREMITY VENOUS DUPLEX Referring Phys: [place]  --------------------------------------------------------------------------------  Indications: Edema Performing Technologist: Elmarie Lindau, RVT  Examination Guidelines: A complete evaluation includes B-mode imaging, spectral Doppler, color Doppler, and power Doppler as needed of all accessible portions of each vessel. Bilateral testing is considered an integral part of a  complete examination. Limited examinations for reoccurring indications may be performed as noted.  Right Findings: +----------+------------+---------+-----------+----------+-------+ RIGHT     CompressiblePhasicitySpontaneousPropertiesSummary +----------+------------+---------+-----------+----------+-------+ Subclavian               Yes                                +----------+------------+---------+-----------+----------+-------+  Left Findings: +----------+------------+---------+-----------+----------+-------+ LEFT      CompressiblePhasicitySpontaneousPropertiesSummary +----------+------------+---------+-----------+----------+-------+ IJV           Full       Yes       Yes                      +----------+------------+---------+-----------+----------+-------+ Subclavian               Yes                                +----------+------------+---------+-----------+----------+-------+ Axillary      Full                                          +----------+------------+---------+-----------+----------+-------+ Brachial      Full                                          +----------+------------+---------+-----------+----------+-------+ Radial        Full                                          +----------+------------+---------+-----------+----------+-------+ Ulnar         Full                                          +----------+------------+---------+-----------+----------+-------+ Cephalic      Full                                           +----------+------------+---------+-----------+----------+-------+ Basilic       Full                                          +----------+------------+---------+-----------+----------+-------+  Summary:  Right: No evidence of thrombosis in the subclavian.  Left: No evidence of deep vein thrombosis in the upper extremity.  *See table(s) above for measurements and observations.  Diagnosing physician: Lonni Gaskins MD Electronically signed by Lonni Gaskins MD on 05/19/2024 at 6:11:31 PM.    Final    DG Chest 2 View Result Date: 05/19/2024 CLINICAL DATA:  Edema EXAM: CHEST - 2 VIEW COMPARISON:  Chest x-ray 04/21/2024 FINDINGS: There is some strandy opacities in the retrocardiac region. There is a focal opacity near the left costophrenic angle, slightly nodular. Small pleural effusions are present bilaterally. The cardiomediastinal silhouette is within normal limits. There are questionable calcified right hilar lymph nodes. There  is no pneumothorax or acute fracture. IMPRESSION: 1. Small bilateral pleural effusions. 2. Strandy opacities in the retrocardiac region may represent atelectasis or infection. 3. Focal opacity near the left costophrenic angle, slightly nodular. Recommend further evaluation with CT. Electronically Signed   By: Greig Pique M.D.   On: 05/19/2024 15:54     Scheduled Meds:  allopurinol   100 mg Oral Daily   atorvastatin  10 mg Oral QPM   enoxaparin (LOVENOX) injection  40 mg Subcutaneous Q24H   folic acid   1 mg Oral Daily   furosemide   20 mg Intravenous BID   metoprolol  tartrate  25 mg Oral BID   multivitamin with minerals  1 tablet Oral Daily   nicotine   14 mg Transdermal Daily   pantoprazole   40 mg Oral BID   thiamine   100 mg Oral Daily   Continuous Infusions:   LOS: 0 days    Time spent:    Sigurd Pac, MD [facility]   05/21/2024, 10:56 AM

## 2024-05-21 NOTE — Progress Notes (Signed)
 Mobility Specialist Progress Note:   05/21/24 0923  Mobility  Activity Ambulated independently in hallway  Level of Assistance Standby assist, set-up cues, supervision of patient - no hands on  Assistive Device Front wheel walker  Distance Ambulated (ft) 500 ft  Activity Response Tolerated well  Mobility Referral Yes  Mobility visit 1 Mobility  Mobility Specialist Start Time (ACUTE ONLY) [id number]  Mobility Specialist Stop Time (ACUTE ONLY) 0935  Mobility Specialist Time Calculation (min) (ACUTE ONLY) 12 min   Pt agreeable and pleasant for session. No c/o of any symptoms. Trial for use of RW initially but is not needed to ambulate. Pt looking forward to another session. Left in recliner comfortable w/ no needs  Pre Mobility: EOB SpO2 97% Post Mobility: Recliner SpO2 96%  Therisa Rana Mobility Specialist Please contact via Special educational needs teacher or  Rehab office at [phone]

## 2024-05-22 DIAGNOSIS — I5031 Acute diastolic (congestive) heart failure: Secondary | ICD-10-CM

## 2024-05-22 DIAGNOSIS — E8770 Fluid overload, unspecified: Secondary | ICD-10-CM | POA: Diagnosis not present

## 2024-05-22 LAB — BASIC METABOLIC PANEL WITH GFR
Anion gap: 4 — ABNORMAL LOW (ref 5–15)
BUN: 7 mg/dL — ABNORMAL LOW (ref 8–23)
CO2: 30 mmol/L (ref 22–32)
Calcium: 7.8 mg/dL — ABNORMAL LOW (ref 8.9–10.3)
Chloride: 104 mmol/L (ref 98–111)
Creatinine, Ser: 0.75 mg/dL (ref 0.61–1.24)
GFR, Estimated: >60 mL/min (ref >60)
Glucose, Bld: 96 mg/dL (ref 70–99)
Potassium: 3.3 mmol/L — ABNORMAL LOW (ref 3.5–5.1)
Sodium: 138 mmol/L (ref 135–145)

## 2024-05-22 MED ORDER — POTASSIUM CHLORIDE CRYS ER 20 MEQ PO TBCR
40.0000 meq | EXTENDED_RELEASE_TABLET | Freq: Two times a day (BID) | ORAL | Status: DC
  Administered 2024-05-22 – 2024-05-23 (×3): 40 meq via ORAL
  Filled 2024-05-22 (×3): qty 2

## 2024-05-22 NOTE — Consult Note (Addendum)
 Cardiology Consultation   Patient ID: Tony Lopez MRN: [id number]; DOB: 07-16-1951  Admit date: 05/19/2024 Date of Consult: 05/22/2024  PCP:  Alvera Reagin, PA   [facility] Providers Cardiologist:  Lynwood Schilling, MD       Patient Profile: Tony Lopez is a 73 y.o. male with a hx of hypertension, alcohol abuse, tobacco abuse, tobacco abuse, prostate cancer, choledocholithiasis s/p cholecystectomy drain on 04/08/2024, who is being seen 05/22/2024 for the evaluation of acute diastolic heart failure at the request of Cheryle Page MD.  History of Present Illness: Tony Lopez is a 73 year old male who has not seen cardiology in the past.   The patient was in a rehab facility after having polymicrobial bacteremia secondary to choledocholithiasis.  He ended up receiving a subtotal cholecystectomy on 04/25/2024 a few days later he received a biliary stent.  He presented to the emergency department on 05/19/2024 complaining of left leg and arm swelling that had started about 3 days prior.   On 05/19/2024 he woke up with shortness of breath and had shortness of breath on exertion. on interview patient denied any chest pain, nausea, vomiting, orthopnea, and diaphoresis.  He has a 40-pack-year history of smoking cigarettes, was abusing alcohol prior to hospitalization about 1 month ago, denies any marijuana use or illicit substance use.  His left arm continues to look swollen.  He stated that he is able to walk up and down the hallway with minimal shortness of breath.  Patient was initially started on 20 mg IV Lasix  and received 2 doses of this.  Was increased to 40 mg and received 2 doses of this.  I's and O's are net out 4.9 L  Labs showed  On 05/19/2024 elevated BNP of 895, high-sensitivity troponins 58 >57, anemia with a hemoglobin of 11.0.  Upper and lower extremity Doppler was negative for a DVT.  Chest CT on 05/20/2024 showed Small bilateral pleural effusions with  associated bibasilar compressive atelectasis. Mild emphysema.  Airway inflammation. Lipomatous hypertrophy of the intra-atrial septum. Resultant high-grade narrowing of the superior vena cava at the cavoatrial junction. Fusiform dilation of the ascending aorta measuring 4.1 cm in greatest dimension. Moderate hepatic steatosis. Internal biliary stent is partially visualized. Pneumobilia present suggesting patency of the stented segment.  Echo on 05/20/2024 showed normal LVEF of 60 to 65%, no regional wall motion abnormalities, mild mitral valve regurgitation, and mild aortic regurgitation, mild dilation of the ascending aorta measured at 42 mm. EKG on 05/21/2024 showed normal sinus rhythm with a rate of 66. there was were 2 PVC's.  Past Medical History:  Diagnosis Date   Arthritis    GERD (gastroesophageal reflux disease)    Hepatitis C    Hypertension    Prostate cancer [facility]     Past Surgical History:  Procedure Laterality Date   BACK SURGERY  1999   BILIARY STENT PLACEMENT  04/28/2024   Procedure: INSERTION, STENT, BILE DUCT;  Surgeon: Saintclair Jasper, MD;  Location: [facility] ENDOSCOPY;  Service: Gastroenterology;;   CHOLECYSTECTOMY N/A 04/25/2024   Procedure: SUBTOTAL CHOLECYSTECTOMY;  Surgeon: Teresa Lonni HERO, MD;  Location: [facility] OR;  Service: General;  Laterality: N/A;  WITH ICG DYE   CYSTOSCOPY WITH INJECTION N/A 10/05/2017   Procedure: CYSTOSCOPY WITH INJECTION INDOCYANINE GREEN  DYE;  Surgeon: Alvaro Hummer, MD;  Location: [facility] ORS;  Service: Urology;  Laterality: N/A;   ERCP N/A 04/28/2024   Procedure: ERCP, WITH INTERVENTION IF INDICATED;  Surgeon: Saintclair Jasper, MD;  Location: [facility] ENDOSCOPY;  Service: Gastroenterology;  Laterality: N/A;   KNEE SURGERY Left early 90s   CARTILAGE CLEAN OUT    LYMPHADENECTOMY Bilateral 10/05/2017   Procedure: PELVIC LYMPHADENECTOMY;  Surgeon: Alvaro Hummer, MD;  Location: [facility] ORS;  Service: Urology;  Laterality: Bilateral;   ROBOT ASSISTED LAPAROSCOPIC RADICAL  PROSTATECTOMY N/A 10/05/2017   Procedure: XI ROBOTIC ASSISTED LAPAROSCOPIC RADICAL PROSTATECTOMY;  Surgeon: Alvaro Hummer, MD;  Location: [facility] ORS;  Service: Urology;  Laterality: N/A;   SPHINCTEROTOMY  04/28/2024   Procedure: SPHINCTEROTOMY,BILIARY;  Surgeon: Saintclair Jasper, MD;  Location: [facility] ENDOSCOPY;  Service: Gastroenterology;;   STONE EXTRACTION WITH BASKET  04/28/2024   Procedure: ERCP, WITH LITHROTRIPSY OR REMOVAL OF COMMON BILE DUCT CALCULUS USING BALLOON;  Surgeon: Saintclair Jasper, MD;  Location: [facility] ENDOSCOPY;  Service: Gastroenterology;;     Home Medications:  Prior to Admission medications   Medication Sig Start Date End Date Taking? Authorizing Provider  allopurinol  (ZYLOPRIM ) 100 MG tablet Take 100 mg by mouth daily.   Yes [provider]  atorvastatin (LIPITOR) 10 MG tablet Take 10 mg by mouth every evening.   Yes [provider]  folic acid  (FOLVITE ) 1 MG tablet Take 1 tablet (1 mg total) by mouth daily. 05/02/24  Yes Singh, Prashant K, MD  metoprolol  tartrate (LOPRESSOR ) 25 MG tablet Take 1 tablet (25 mg total) by mouth 2 (two) times daily. 05/02/24  Yes Singh, Prashant K, MD  Multiple Vitamin (MULTIVITAMIN WITH MINERALS) TABS tablet Take 1 tablet by mouth daily. 05/02/24  Yes Dennise Lavada POUR, MD  nicotine  (NICODERM CQ  - DOSED IN MG/24 HOURS) 21 mg/24hr patch Place 1 patch (21 mg total) onto the skin daily. 05/02/24  Yes Singh, Prashant K, MD  pantoprazole  (PROTONIX ) 40 MG tablet Take 1 tablet (40 mg total) by mouth 2 (two) times daily. 05/02/24  Yes Singh, Prashant K, MD  thiamine  (VITAMIN B-1) 100 MG tablet Take 1 tablet (100 mg total) by mouth daily. 05/02/24  Yes Singh, Prashant K, MD  traMADol  (ULTRAM ) 50 MG tablet Take 1 tablet (50 mg total) by mouth every 6 (six) hours as needed. 05/02/24 05/02/25 Yes Singh, Prashant K, MD  amoxicillin -clavulanate (AUGMENTIN ) 875-125 MG tablet Take 1 tablet by mouth every 12 (twelve) hours. Patient not taking: Reported on 05/20/2024 05/02/24    Singh, Prashant K, MD  levofloxacin  (LEVAQUIN ) 750 MG tablet Take 1 tablet (750 mg total) by mouth daily. Patient not taking: Reported on 05/20/2024 05/02/24   Singh, Prashant K, MD  potassium chloride  (KLOR-CON ) 10 MEQ tablet Take 10 mEq by mouth daily. Patient not taking: Reported on 05/20/2024 05/08/24   [provider]    Scheduled Meds:  allopurinol   100 mg Oral Daily   atorvastatin  10 mg Oral QPM   enoxaparin (LOVENOX) injection  40 mg Subcutaneous Q24H   folic acid   1 mg Oral Daily   furosemide   40 mg Intravenous BID   metoprolol  tartrate  25 mg Oral BID   multivitamin with minerals  1 tablet Oral Daily   nicotine   14 mg Transdermal Daily   pantoprazole   40 mg Oral BID   potassium chloride   40 mEq Oral BID   spironolactone  25 mg Oral Daily   thiamine   100 mg Oral Daily   Continuous Infusions:  PRN Meds: traMADol   Allergies:   No Known Allergies  Social History:   Social History   Socioeconomic History   Marital status: Legally Separated    Spouse name: Not on file   Number of children:  Not on file   Years of education: Not on file   Highest education level: Not on file  Occupational History   Not on file  Tobacco Use   Smoking status: Every Day    Current packs/day: 1.00    Average packs/day: 1 pack/day for 35.0 years (35.0 ttl pk-yrs)    Types: Cigarettes   Smokeless tobacco: Never  Substance and Sexual Activity   Alcohol use: Yes    Comment: SELDOM    Drug use: No   Sexual activity: Not on file  Other Topics Concern   Not on file  Social History Narrative   Not on file   Social Drivers of Health   Financial Resource Strain: Not on file  Food Insecurity: No Food Insecurity (04/19/2024)   Hunger Vital Sign    Worried About Running Out of Food in the Last Year: Never true    Ran Out of Food in the Last Year: Never true  Transportation Needs: No Transportation Needs (04/19/2024)   PRAPARE - Administrator, Civil Service (Medical): No     Lack of Transportation (Non-Medical): No  Physical Activity: Not on file  Stress: Not on file  Social Connections: Not on file  Intimate Partner Violence: Not At Risk (04/19/2024)   Humiliation, Afraid, Rape, and Kick questionnaire    Fear of Current or Ex-Partner: No    Emotionally Abused: No    Physically Abused: No    Sexually Abused: No    Family History:    Family History  Problem Relation Age of Onset   Lung cancer Mother    Prostate cancer Neg Hx      ROS:  Please see the history of present illness.   All other ROS reviewed and negative.     Physical Exam/Data: Vitals:   05/22/24 0415 05/22/24 0738 05/22/24 0805 05/22/24 1150  BP: (!) 141/71 (!) 147/86  (!) 146/91  Pulse: 61 83  72  Resp: 20 20  18   Temp: 98 F (36.7 C) (!) 100.9 F (38.3 C) 98.1 F (36.7 C) 98.4 F (36.9 C)  TempSrc: Oral Oral Oral Oral  SpO2:  92%  97%  Weight:        Intake/Output Summary (Last 24 hours) at 05/22/2024 1454 Last data filed at 05/22/2024 1030 Gross per 24 hour  Intake 120 ml  Output 2550 ml  Net -2430 ml      05/21/2024   11:33 PM 05/21/2024    4:28 AM 04/23/2024    4:26 AM  Last 3 Weights  Weight (lbs) 192 lb 8 oz 194 lb 0.1 oz 194 lb 10.7 oz  Weight (kg) 87.317 kg 88 kg 88.3 kg     Body mass index is 26.11 kg/m.  General:  Well nourished, well developed, in no acute distress.  On room air, alert and orientated. HEENT: normal Neck: JVD assessment limited by body habitus Vascular: No carotid bruits; Distal pulses 2+ bilaterally Cardiac:  normal S1, S2; RRR; no murmur  Lungs: Some wheezing and crackling heard on lower lung lobes. Abd: soft, nontender, no hepatomegaly  Ext: no edema Musculoskeletal:  No deformities. Skin: warm and dry  Neuro:  no focal abnormalities noted Psych:  Normal affect   EKG:  The EKG was personally reviewed and demonstrates:  EKG on 05/21/2024 showed normal sinus rhythm with a rate of 66. there was were 2 PVC's. Telemetry:  Telemetry  was personally reviewed and demonstrates: Normal sinus rhythm with rates in the 60s  to 70s.  Frequent PACs.  Relevant CV Studies: Transthoracic echo on 05/20/2024 IMPRESSIONS     1. Left ventricular ejection fraction, by estimation, is 60 to 65%. The  left ventricle has normal function. The left ventricle has no regional  wall motion abnormalities. Left ventricular diastolic parameters are  indeterminate.   2. Right ventricular systolic function is normal. The right ventricular  size is mildly enlarged. Tricuspid regurgitation signal is inadequate for  assessing PA pressure.   3. The mitral valve is grossly normal. Mild mitral valve regurgitation.   4. The aortic valve was not well visualized. Aortic valve regurgitation  is mild.   5. Aortic dilatation noted. There is mild dilatation of the ascending  aorta, measuring 42 mm.   6. The inferior vena cava is normal in size with greater than 50%  respiratory variability, suggesting right atrial pressure of 3 mmHg.   Comparison(s): No significant change from prior study. Prior images  reviewed side by side.    Laboratory Data: High Sensitivity Troponin:  No results for input(s): TROPONINIHS in the last 720 hours.   Chemistry Recent Labs  Lab 05/19/24 1531 05/21/24 0306 05/22/24 0306  NA 140 141 138  K 3.1* 3.5 3.3*  CL 103 104 104  CO2 24 27 30   GLUCOSE 102* 89 96  BUN <5* <5* 7*  CREATININE 0.81 0.76 0.75  CALCIUM  8.2* 7.9* 7.8*  GFRNONAA >60 >60 >60  ANIONGAP 13 10 4*    Recent Labs  Lab 05/19/24 1531  PROT 5.7*  ALBUMIN  2.5*  AST 28  ALT 24  ALKPHOS 128*  BILITOT 0.7   Lipids No results for input(s): CHOL, TRIG, HDL, LABVLDL, LDLCALC, CHOLHDL in the last 168 hours.  Hematology Recent Labs  Lab 05/19/24 1531  WBC 5.9  RBC 3.38*  HGB 11.0*  HCT 34.1*  MCV 100.9*  MCH 32.5  MCHC 32.3  RDW 13.5  PLT 263   Thyroid No results for input(s): TSH, FREET4 in the last 168 hours.  BNP Recent  Labs  Lab 05/19/24 1531  BNP 895.8*    DDimer No results for input(s): DDIMER in the last 168 hours.  Radiology/Studies:  ECHOCARDIOGRAM LIMITED Result Date: 05/20/2024    ECHOCARDIOGRAM LIMITED REPORT   Patient Name:   Tony Lopez Date of Exam: 05/20/2024 Medical Rec #:  [id number]          Height:       72.0 in Accession #:    [id number]         Weight:       194.7 lb Date of Birth:  06/11/1951         BSA:          2.106 m Patient Age:    72 years           BP:           172/85 mmHg Patient Gender: M                  HR:           75 bpm. Exam Location:  Inpatient Procedure: Limited Echo, Cardiac Doppler and Color Doppler (Both Spectral and            Color Flow Doppler were utilized during procedure). Indications:    I50.40* Unspecified combined systolic (congestive) and diastolic                 (congestive) heart failure. Elevated BNP  History:  Patient has prior history of Echocardiogram examinations, most                 recent 04/28/2024. Abnormal ECG, Arrythmias:Tachycardia,                 Signs/Symptoms:Bacteremia and Edema; Risk Factors:Hypertension.                 Cancer. ETOH.  Sonographer:    Ellouise Mose RDCS Referring Phys: [id number] [facility]  Sonographer Comments: Technically difficult study due to poor echo windows. IMPRESSIONS  1. Left ventricular ejection fraction, by estimation, is 60 to 65%. The left ventricle has normal function. The left ventricle has no regional wall motion abnormalities. Left ventricular diastolic parameters are indeterminate.  2. Right ventricular systolic function is normal. The right ventricular size is mildly enlarged. Tricuspid regurgitation signal is inadequate for assessing PA pressure.  3. The mitral valve is grossly normal. Mild mitral valve regurgitation.  4. The aortic valve was not well visualized. Aortic valve regurgitation is mild.  5. Aortic dilatation noted. There is mild dilatation of the ascending aorta, measuring 42 mm.  6. The  inferior vena cava is normal in size with greater than 50% respiratory variability, suggesting right atrial pressure of 3 mmHg. Comparison(s): No significant change from prior study. Prior images reviewed side by side. FINDINGS  Left Ventricle: Left ventricular ejection fraction, by estimation, is 60 to 65%. The left ventricle has normal function. The left ventricle has no regional wall motion abnormalities. The left ventricular internal cavity size was normal in size. There is  no left ventricular hypertrophy. Left ventricular diastolic parameters are indeterminate. Right Ventricle: The right ventricular size is mildly enlarged. Right ventricular systolic function is normal. Tricuspid regurgitation signal is inadequate for assessing PA pressure. Mitral Valve: The mitral valve is grossly normal. Mild mitral valve regurgitation. Tricuspid Valve: The tricuspid valve is normal in structure. Tricuspid valve regurgitation is not demonstrated. No evidence of tricuspid stenosis. Aortic Valve: The aortic valve was not well visualized. Aortic valve regurgitation is mild. Aorta: Aortic dilatation noted. There is mild dilatation of the ascending aorta, measuring 42 mm. Venous: The inferior vena cava is normal in size with greater than 50% respiratory variability, suggesting right atrial pressure of 3 mmHg. Additional Comments: Spectral Doppler performed. Color Doppler performed.  LEFT VENTRICLE PLAX 2D LVIDd:         4.80 cm LVIDs:         3.10 cm LV PW:         2.60 cm LV IVS:        1.10 cm  LV Volumes (MOD) LV vol d, MOD A2C: 89.1 ml LV vol d, MOD A4C: 97.2 ml LV vol s, MOD A2C: 32.4 ml LV vol s, MOD A4C: 39.4 ml LV SV MOD A2C:     56.7 ml LV SV MOD A4C:     97.2 ml LV SV MOD BP:      58.1 ml IVC IVC diam: 1.80 cm  AORTA Ao Root diam: 4.40 cm Ao Asc diam:  4.13 cm MITRAL VALVE MV Area (PHT): 3.65 cm MV Decel Time: 208 msec MV E velocity: 73.30 cm/s MV A velocity: 107.00 cm/s MV E/A ratio:  0.69 Stanly Leavens MD  Electronically signed by Stanly Leavens MD Signature Date/Time: 05/20/2024/11:39:07 AM    Final    CT Chest Wo Contrast Result Date: 05/20/2024 CLINICAL DATA:  Abnormal chest x-ray, pulmonary nodule EXAM: CT CHEST WITHOUT CONTRAST TECHNIQUE: Multidetector CT imaging of  the chest was performed following the standard protocol without IV contrast. RADIATION DOSE REDUCTION: This exam was performed according to the departmental dose-optimization program which includes automated exposure control, adjustment of the mA and/or kV according to patient size and/or use of iterative reconstruction technique. COMPARISON:  None Available. FINDINGS: Cardiovascular: No significant coronary artery calcification. Lipomatous hypertrophy of the intra-atrial septum. Resultant high-grade narrowing of the superior vena cava at the cavoatrial junction (107/3). Global cardiac size within normal limits. No pericardial effusion. Central pulmonary arteries are of normal caliber. There is fusiform dilation of the ascending aorta measuring 4.1 cm in greatest dimension. Descending thoracic aorta is of normal caliber. Mild superimposed atherosclerotic calcification. Mediastinum/Nodes: No enlarged mediastinal or axillary lymph nodes. Thyroid gland, trachea, and esophagus demonstrate no significant findings. Lungs/Pleura: Small bilateral pleural effusions with associated bibasilar compressive atelectasis. Mild emphysema. Bronchial wall thickening in keeping with airway inflammation. Opacity seen at the left lung base corresponds to a small area of parenchymal scarring within basilar lingula. No focal pulmonary nodule or infiltrate. No pneumothorax. No central obstructing lesion. Upper Abdomen: At least moderate hepatic steatosis. Internal biliary stent is partially visualized. Pneumobilia present suggesting patency of the stented segment. No acute abnormality. Musculoskeletal: No chest wall mass or suspicious bone lesions identified.  IMPRESSION: 1. Small bilateral pleural effusions with associated bibasilar compressive atelectasis. 2. Mild emphysema.  Airway inflammation. 3. Lipomatous hypertrophy of the intra-atrial septum. Resultant high-grade narrowing of the superior vena cava at the cavoatrial junction. 4. Fusiform dilation of the ascending aorta measuring 4.1 cm in greatest dimension. Recommend annual imaging followup by CTA or MRA. This recommendation follows 2010 ACCF/AHA/AATS/ACR/ASA/SCA/SCAI/SIR/STS/SVM Guidelines for the Diagnosis and Management of Patients with Thoracic Aortic Disease. Circulation. 2010; 121: Z733-z630. Aortic aneurysm NOS (ICD10-I71.9) 5. Moderate hepatic steatosis. 6. Internal biliary stent is partially visualized. Pneumobilia present suggesting patency of the stented segment. Aortic Atherosclerosis (ICD10-I70.0) and Emphysema (ICD10-J43.9). Electronically Signed   By: Dorethia Molt M.D.   On: 05/20/2024 02:49   VAS US  LOWER EXTREMITY VENOUS (DVT) (ONLY [facility]) Result Date: 05/19/2024  Lower Venous DVT Study Patient Name:  Tony Lopez  Date of Exam:   05/19/2024 Medical Rec #: [id number]           Accession #:    [id number] Date of Birth: Dec 16, 1950          Patient Gender: M Patient Age:   68 years Exam Location:  [facility] Procedure:      VAS US  LOWER EXTREMITY VENOUS (DVT) Referring Phys: [place] --------------------------------------------------------------------------------  Indications: Edema.  Performing Technologist: Elmarie Lindau, RVT  Examination Guidelines: A complete evaluation includes B-mode imaging, spectral Doppler, color Doppler, and power Doppler as needed of all accessible portions of each vessel. Bilateral testing is considered an integral part of a complete examination. Limited examinations for reoccurring indications may be performed as noted. The reflux portion of the exam is performed with the patient in reverse Trendelenburg.   +-----+---------------+---------+-----------+----------+--------------+ RIGHTCompressibilityPhasicitySpontaneityPropertiesThrombus Aging +-----+---------------+---------+-----------+----------+--------------+ CFV  Full           Yes      Yes                                 +-----+---------------+---------+-----------+----------+--------------+   +---------+---------------+---------+-----------+----------+-------------------+ LEFT     CompressibilityPhasicitySpontaneityPropertiesThrombus Aging      +---------+---------------+---------+-----------+----------+-------------------+ CFV      Full           Yes  Yes                                      +---------+---------------+---------+-----------+----------+-------------------+ SFJ      Full                                                             +---------+---------------+---------+-----------+----------+-------------------+ FV Prox  Full                                                             +---------+---------------+---------+-----------+----------+-------------------+ FV Mid   Full                                                             +---------+---------------+---------+-----------+----------+-------------------+ FV DistalFull                                                             +---------+---------------+---------+-----------+----------+-------------------+ PFV      Full                                                             +---------+---------------+---------+-----------+----------+-------------------+ POP      Full           Yes      Yes                                      +---------+---------------+---------+-----------+----------+-------------------+ PTV      Full                                                             +---------+---------------+---------+-----------+----------+-------------------+ PERO                                                   Not well visualized +---------+---------------+---------+-----------+----------+-------------------+ The peroneal veins are not seen well.     Summary: RIGHT: - No evidence of common femoral vein obstruction.   LEFT: - There is no evidence of deep vein thrombosis in the lower extremity. However, portions of this examination were limited- see technologist  comments above.  - No cystic structure found in the popliteal fossa.  *See table(s) above for measurements and observations. Electronically signed by Lonni Gaskins MD on 05/19/2024 at 6:11:48 PM.    Final    UE Venous Duplex ([facility] ONLY) Result Date: 05/19/2024 UPPER VENOUS STUDY  Patient Name:  Tony Lopez  Date of Exam:   05/19/2024 Medical Rec #: [id number]           Accession #:    [id number] Date of Birth: 1951/03/02          Patient Gender: M Patient Age:   74 years Exam Location[facility]  [facility] Procedure:      VAS US  UPPER EXTREMITY VENOUS DUPLEX Referring Phys: [place] --------------------------------------------------------------------------------  Indications: Edema Performing Technologist: Elmarie Lindau, RVT  Examination Guidelines: A complete evaluation includes B-mode imaging, spectral Doppler, color Doppler, and power Doppler as needed of all accessible portions of each vessel. Bilateral testing is considered an integral part of a complete examination. Limited examinations for reoccurring indications may be performed as noted.  Right Findings: +----------+------------+---------+-----------+----------+-------+ RIGHT     CompressiblePhasicitySpontaneousPropertiesSummary +----------+------------+---------+-----------+----------+-------+ Subclavian               Yes                                +----------+------------+---------+-----------+----------+-------+  Left Findings: +----------+------------+---------+-----------+----------+-------+ LEFT       CompressiblePhasicitySpontaneousPropertiesSummary +----------+------------+---------+-----------+----------+-------+ IJV           Full       Yes       Yes                      +----------+------------+---------+-----------+----------+-------+ Subclavian               Yes                                +----------+------------+---------+-----------+----------+-------+ Axillary      Full                                          +----------+------------+---------+-----------+----------+-------+ Brachial      Full                                          +----------+------------+---------+-----------+----------+-------+ Radial        Full                                          +----------+------------+---------+-----------+----------+-------+ Ulnar         Full                                          +----------+------------+---------+-----------+----------+-------+ Cephalic      Full                                          +----------+------------+---------+-----------+----------+-------+  Basilic       Full                                          +----------+------------+---------+-----------+----------+-------+  Summary:  Right: No evidence of thrombosis in the subclavian.  Left: No evidence of deep vein thrombosis in the upper extremity.  *See table(s) above for measurements and observations.  Diagnosing physician: Lonni Gaskins MD Electronically signed by Lonni Gaskins MD on 05/19/2024 at 6:11:31 PM.    Final    DG Chest 2 View Result Date: 05/19/2024 CLINICAL DATA:  Edema EXAM: CHEST - 2 VIEW COMPARISON:  Chest x-ray 04/21/2024 FINDINGS: There is some strandy opacities in the retrocardiac region. There is a focal opacity near the left costophrenic angle, slightly nodular. Small pleural effusions are present bilaterally. The cardiomediastinal silhouette is within normal limits. There are questionable calcified right hilar lymph nodes.  There is no pneumothorax or acute fracture. IMPRESSION: 1. Small bilateral pleural effusions. 2. Strandy opacities in the retrocardiac region may represent atelectasis or infection. 3. Focal opacity near the left costophrenic angle, slightly nodular. Recommend further evaluation with CT. Electronically Signed   By: Greig Pique M.D.   On: 05/19/2024 15:54     Assessment and Plan: AADAM ZHEN is a 73 y.o. male with a hx of hypertension, alcohol abuse, tobacco abuse, tobacco abuse, prostate cancer, choledocholithiasis s/p cholecystectomy drain on 04/08/2024, who is being seen 05/22/2024 for the evaluation of acute diastolic heart failure at the request of Cheryle Page MD.  Possible acute diastolic heart failure Hypoalbuminemia Presented to the emergency department for swelling on left arm and leg for 3 days.  Upper and lower extremity ultrasounds were negative for DVT.  Echocardiogram showed normal LVEF of 60 to 65%, no regional wall motion abnormalities, mild mitral valve regurgitation, and mild aortic regurgitation, mild dilation of the ascending aorta measured at 42 mm.  BNP was elevated at 895. Chest CT on 05/20/2024 showed small bilateral pleural effusions with associated bibasilar compressive atelectasis.  Patient reported that his shortness of breath has improved after being hospitalized.  Reported that today he was able to walk around the hall.  Denies any orthopnea.  On physical exam patient appeared euvolemic but he has been on IV diuresis for 3 days at this point.  With shortness of breath improving after diuresis, elevated BNP, and small bilateral pleural effusions on chest CT likely had diastolic heart failure. I's and O's are net out 4.9 L current weight is 87.3 kg weight was 88 kg on 05/02/24 Left elbow continues to look swollen may consider imaging. Continue IV Lasix .  Continue spironolactone. Consider starting Jardiance.   Dilation of the ascending aorta Fusiform dilation of the  ascending aorta measuring 4.1 cm in greatest dimension. Needs yearly follow up  Lipomatous hypertrophy of the intra-atrial septum Chest CT found Lipomatous hypertrophy of the intra-atrial septum. Resultant high-grade narrowing of the superior vena cava at the cavoatrial junction. No such findings were found on echocardiogram  Elevated high-sensitivity troponins 58> 57 Suspect this may be due to demand ischemia from acute diastolic heart failure.  Otherwise manage per primary    Risk Assessment/Risk Scores:       [place]  Heart Association (NYHA) Functional Class NYHA Class II       For questions or updates, please contact [facility] Please consult [email or web address] for contact info under  Signed, Morse Clause, PA-C  05/22/2024 2:54 PM  History and all data above reviewed.  Patient examined.  I agree with the findings as above.  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  He has been active in that job and never had any past cardiac history.  He had a complicated hospitalization as above.  He went to rehab and noticed that his left arm was very swollen.  He did not really notice any in his right arm.  He was not really noticing any ankle edema.  Prior to this hospitalization he has been drinking a lot of alcohol as he has been dealing with some chronic neck pain.  He admits to being malnourished.  He had a very low albumin .  The etiology of his left arm swelling is not clear as there is been no evidence of DVT.  He has responded to diuretics.  However, he is noted to have an abnormality as described below on his CT.  Echo was fairly unrevealing.  There is probably a lipomatous septum.  He has not had any recent shortness of breath, PND or orthopnea.  He does not have chest pressure, neck or arm discomfort.  The patient exam reveals COR: Regular rate and rhythm, no rubs, murmurs,  Lungs: Clear to auscultation bilaterally,  Abd: Positive bowel sounds normal frequency and pitch,  rupees, rebound, guarding, Ext 2+ pulses, no edema..  All available labs, radiology testing, previous records reviewed. Agree with documented assessment and plan.  Edema: Patient presented with upper extremity edema.  There is mildly elevated BNP.  He has responded to diuretics.  His echocardiogram demonstrates normal systolic function.  There is probably some component of diastolic dysfunction.  There is also an abnormality on CT suggesting lipomatous changes to the atrial septum with some question of obstruction of the superior vena cava.  For that we will plan an outpatient MRI.  At this point he seems to be euvolemic.  Bernadine is reasonable.  Continue spironolactone.  We can send him home on as needed Lasix .  Ascending aortic dilatation:  Plan CT in one year.  Arm swelling: Again the etiology of this is not clear.  Pending results of the MRI as an outpatient he may also need upper extremity angiography.  Lynwood Schilling  7:37 PM  05/22/2024

## 2024-05-22 NOTE — Progress Notes (Addendum)
 PROGRESS NOTE    Tony Lopez  FMW:[id number] DOB: [birth year] DOA: 05/19/2024 PCP: Alvera Reagin, PA   Brief Narrative:  73 year old male with  history of hypertension, prostate CA, alcohol use, recently admitted with acute cholecystitis, choledocholithiasis, bacteremia complicated by alcohol withdrawal, treated with supportive care, antibiotics, underwent percutaneous cholecystostomy drain on 04/08/2024 and subsequently discharged to rehab few weeks ago presented with worsening lower extremity edema and dyspnea on exertion.  He was admitted with possible new diagnosis of acute diastolic CHF and started on IV diuretics.  2D echo showed EF of 60 to 35%.  Assessment & Plan:   New diagnosis of possible acute diastolic CHF Hypoalbuminemia with third spacing -Echo showed EF of 60 to 65% - Continue strict input and output.  Daily weights.  Fluid restriction.  Continue IV Lasix .  Continue metoprolol  and spironolactone.  I have consulted cardiology today.  Follow recommendations.  Hypokalemia - Replace.  Repeat a.m. labs . History of alcohol use -Recent hospitalization with alcohol withdrawal.  Denies recent alcohol use but he has been in rehab for the last few weeks - Continue multivitamin, thiamine  and folic acid .  History of cholecystitis, choledocholithiasis with polymicrobial bacteremia -Completed course of antibiotics.  Treated with ERCP by [facility] on 04/28/2024 with CBD stent placed.  Outpatient follow-up with GI. -Status post percutaneous cholecystostomy 04/08/24, this was subsequently removed by IR   Tobacco abuse - Counseled regarding cessation by prior hospitalist.  Continue nicotine  patch.    DVT prophylaxis: Lovenox Code Status: Full Family Communication: None at bedside Disposition Plan: Status is: Inpatient Remains inpatient appropriate because: Of severity of illness.  Possible discharge in 1 to 2 days if clinically improves    Consultants:  Cardiology  Procedures: None  Antimicrobials: None   Subjective: Patient seen and examined at bedside.  He is slightly better but still short of breath with exertion.  Lower extremity swelling is improving.  No chest pain or vomiting reported.  Had fever earlier this morning  Objective: Vitals:   05/22/24 0150 05/22/24 0415 05/22/24 0738 05/22/24 0805  BP:  (!) 141/71 (!) 147/86   Pulse:  61 83   Resp: 16 20 20    Temp:  98 F (36.7 C) (!) 100.9 F (38.3 C) 98.1 F (36.7 C)  TempSrc:  Oral Oral Oral  SpO2:   92%   Weight:        Intake/Output Summary (Last 24 hours) at 05/22/2024 1125 Last data filed at 05/22/2024 1030 Gross per 24 hour  Intake 120 ml  Output 2550 ml  Net -2430 ml   Filed Weights   05/21/24 0428 05/21/24 2333  Weight: 88 kg 87.3 kg    Examination:  General exam: Appears calm and comfortable.  Left chronically ill and deconditioned Respiratory system: Bilateral decreased breath sounds at bases with scattered crackles Cardiovascular system: S1 & S2 heard, Rate controlled Gastrointestinal system: Abdomen is nondistended, soft and nontender. Normal bowel sounds heard. Extremities: No cyanosis, clubbing; bilateral lower extremity edema present Central nervous system: Alert and oriented. No focal neurological deficits. Moving extremities Skin: No rashes, lesions or ulcers Psychiatry: Flat affect.  Not agitated.    Data Reviewed: I have personally reviewed following labs and imaging studies  CBC: Recent Labs  Lab 05/19/24 1531  WBC 5.9  NEUTROABS 3.9  HGB 11.0*  HCT 34.1*  MCV 100.9*  PLT 263   Basic Metabolic Panel: Recent Labs  Lab 05/19/24 1531 05/21/24 0306 05/22/24 0306  NA 140 141 138  K  3.1* 3.5 3.3*  CL 103 104 104  CO2 24 27 30   GLUCOSE 102* 89 96  BUN <5* <5* 7*  CREATININE 0.81 0.76 0.75  CALCIUM  8.2* 7.9* 7.8*   GFR: Estimated Creatinine Clearance: 93 mL/min (by C-G formula based on SCr of 0.75 mg/dL). Liver Function  Tests: Recent Labs  Lab 05/19/24 1531  AST 28  ALT 24  ALKPHOS 128*  BILITOT 0.7  PROT 5.7*  ALBUMIN  2.5*   No results for input(s): LIPASE, AMYLASE in the last 168 hours. No results for input(s): AMMONIA in the last 168 hours. Coagulation Profile: No results for input(s): INR, PROTIME in the last 168 hours. Cardiac Enzymes: No results for input(s): CKTOTAL, CKMB, CKMBINDEX, TROPONINI in the last 168 hours. BNP (last 3 results) No results for input(s): PROBNP in the last 8760 hours. HbA1C: No results for input(s): HGBA1C in the last 72 hours. CBG: Recent Labs  Lab 05/20/24 0750  GLUCAP 88   Lipid Profile: No results for input(s): CHOL, HDL, LDLCALC, TRIG, CHOLHDL, LDLDIRECT in the last 72 hours. Thyroid Function Tests: No results for input(s): TSH, T4TOTAL, FREET4, T3FREE, THYROIDAB in the last 72 hours. Anemia Panel: No results for input(s): VITAMINB12, FOLATE, FERRITIN, TIBC, IRON, RETICCTPCT in the last 72 hours. Sepsis Labs: No results for input(s): PROCALCITON, LATICACIDVEN in the last 168 hours.  No results found for this or any previous visit (from the past 240 hours).       Radiology Studies: No results found.      Scheduled Meds:  allopurinol   100 mg Oral Daily   atorvastatin  10 mg Oral QPM   enoxaparin (LOVENOX) injection  40 mg Subcutaneous Q24H   folic acid   1 mg Oral Daily   furosemide   40 mg Intravenous BID   metoprolol  tartrate  25 mg Oral BID   multivitamin with minerals  1 tablet Oral Daily   nicotine   14 mg Transdermal Daily   pantoprazole   40 mg Oral BID   potassium chloride   40 mEq Oral BID   spironolactone  25 mg Oral Daily   thiamine   100 mg Oral Daily   Continuous Infusions:        Sophie Mao, MD [facility] 05/22/2024, 11:25 AM

## 2024-05-22 NOTE — Plan of Care (Signed)

## 2024-05-22 NOTE — Progress Notes (Signed)
 Heart Failure Navigator Progress Note  Assessed for Heart & Vascular TOC clinic readiness.  Patient does not meet criteria due to EF 60-65%, per MD note will follow up with GI. No HF TOC. .   Navigator will sign off at this time.   Stephane Haddock, BSN, Scientist, clinical (histocompatibility and immunogenetics) Only

## 2024-05-22 NOTE — Progress Notes (Signed)
 Mobility Specialist Progress Note:    05/22/24 1133  Mobility  Activity Ambulated independently in hallway  Level of Assistance Standby assist, set-up cues, supervision of patient - no hands on  Assistive Device None  Distance Ambulated (ft) 500 ft  Activity Response Tolerated well  Mobility Referral Yes  Mobility visit 1 Mobility  Mobility Specialist Start Time (ACUTE ONLY) 1133  Mobility Specialist Stop Time (ACUTE ONLY) 1142  Mobility Specialist Time Calculation (min) (ACUTE ONLY) 9 min   Pt agreeable and eager to session. Pt moving well out of bed and ambulating. No c/o symptoms. Left pt in bed comfortably w/ no needs.  Therisa Rana Mobility Specialist Please contact via SecureChat or  Rehab office at [phone]

## 2024-05-23 ENCOUNTER — Other Ambulatory Visit: Payer: Self-pay

## 2024-05-23 ENCOUNTER — Encounter (HOSPITAL_COMMUNITY): Payer: Self-pay | Admitting: Internal Medicine

## 2024-05-23 ENCOUNTER — Telehealth: Payer: Self-pay | Admitting: Cardiology

## 2024-05-23 DIAGNOSIS — I5031 Acute diastolic (congestive) heart failure: Secondary | ICD-10-CM

## 2024-05-23 DIAGNOSIS — E8770 Fluid overload, unspecified: Secondary | ICD-10-CM | POA: Diagnosis not present

## 2024-05-23 LAB — MAGNESIUM: Magnesium: 1.1 mg/dL — ABNORMAL LOW (ref 1.7–2.4)

## 2024-05-23 LAB — BASIC METABOLIC PANEL WITH GFR
Anion gap: 12 (ref 5–15)
BUN: 8 mg/dL (ref 8–23)
CO2: 28 mmol/L (ref 22–32)
Calcium: 8.5 mg/dL — ABNORMAL LOW (ref 8.9–10.3)
Chloride: 98 mmol/L (ref 98–111)
Creatinine, Ser: 0.82 mg/dL (ref 0.61–1.24)
GFR, Estimated: >60 mL/min (ref >60)
Glucose, Bld: 89 mg/dL (ref 70–99)
Potassium: 3.6 mmol/L (ref 3.5–5.1)
Sodium: 138 mmol/L (ref 135–145)

## 2024-05-23 MED ORDER — SPIRONOLACTONE 25 MG PO TABS: 25.0000 mg | ORAL_TABLET | Freq: Every day | ORAL | 0 refills | Status: AC

## 2024-05-23 MED ORDER — MAGNESIUM SULFATE 4 GM/100ML IV SOLN
4.0000 g | Freq: Once | INTRAVENOUS | Status: AC
  Administered 2024-05-23: 4 g via INTRAVENOUS
  Filled 2024-05-23: qty 100

## 2024-05-23 MED ORDER — FUROSEMIDE 20 MG PO TABS: 20.0000 mg | ORAL_TABLET | Freq: Every day | ORAL | 0 refills | Status: AC | PRN

## 2024-05-23 MED ORDER — TRAMADOL HCL 50 MG PO TABS: 50.0000 mg | ORAL_TABLET | Freq: Four times a day (QID) | ORAL | 0 refills | Status: AC | PRN

## 2024-05-23 NOTE — Plan of Care (Signed)
  Problem: Education: Goal: Knowledge of General Education information will improve Description: Including pain rating scale, medication(s)/side effects and non-pharmacologic comfort measures 05/23/2024 1102 by Berkeley Verdie BRAVO, RN Outcome: Adequate for Discharge 05/23/2024 0847 by Berkeley Verdie BRAVO, RN Outcome: Progressing   Problem: Health Behavior/Discharge Planning: Goal: Ability to manage health-related needs will improve Outcome: Adequate for Discharge   Problem: Clinical Measurements: Goal: Ability to maintain clinical measurements within normal limits will improve 05/23/2024 1102 by Berkeley Verdie BRAVO, RN Outcome: Adequate for Discharge 05/23/2024 0847 by Berkeley Verdie BRAVO, RN Outcome: Progressing Goal: Will remain free from infection Outcome: Adequate for Discharge Goal: Diagnostic test results will improve Outcome: Adequate for Discharge Goal: Respiratory complications will improve Outcome: Adequate for Discharge Goal: Cardiovascular complication will be avoided Outcome: Adequate for Discharge   Problem: Activity: Goal: Risk for activity intolerance will decrease 05/23/2024 1102 by Berkeley Verdie BRAVO, RN Outcome: Adequate for Discharge 05/23/2024 0847 by Berkeley Verdie BRAVO, RN Outcome: Progressing   Problem: Nutrition: Goal: Adequate nutrition will be maintained Outcome: Adequate for Discharge   Problem: Coping: Goal: Level of anxiety will decrease Outcome: Adequate for Discharge   Problem: Elimination: Goal: Will not experience complications related to bowel motility Outcome: Adequate for Discharge Goal: Will not experience complications related to urinary retention Outcome: Adequate for Discharge   Problem: Pain Managment: Goal: General experience of comfort will improve and/or be controlled Outcome: Adequate for Discharge   Problem: Safety: Goal: Ability to remain free from injury will improve Outcome: Adequate for Discharge   Problem: Skin Integrity: Goal: Risk for  impaired skin integrity will decrease Outcome: Adequate for Discharge

## 2024-05-23 NOTE — Telephone Encounter (Signed)
 Per Dr. Lavona ordering cMRI, to evaluate the SVC/RA junction and possible septal lipoma/lipomatous hypertrophy.

## 2024-05-23 NOTE — Progress Notes (Signed)
 Progress Note  Patient Name: Tony Lopez Date of Encounter: 05/23/2024  Primary Cardiologist:   Lynwood Schilling, MD   Subjective   No chest pain.  No SOB.    Inpatient Medications    Scheduled Meds:  allopurinol   100 mg Oral Daily   atorvastatin   10 mg Oral QPM   enoxaparin  (LOVENOX ) injection  40 mg Subcutaneous Q24H   folic acid   1 mg Oral Daily   furosemide   40 mg Intravenous BID   metoprolol  tartrate  25 mg Oral BID   multivitamin with minerals  1 tablet Oral Daily   nicotine   14 mg Transdermal Daily   pantoprazole   40 mg Oral BID   potassium chloride   40 mEq Oral BID   spironolactone   25 mg Oral Daily   thiamine   100 mg Oral Daily   Continuous Infusions:  magnesium  sulfate bolus IVPB 4 g (05/23/24 0810)   PRN Meds: traMADol    Vital Signs    Vitals:   05/22/24 2355 05/23/24 0004 05/23/24 0442 05/23/24 0728  BP: (!) 148/68  (!) 140/66 (!) 142/71  Pulse: 62  64 76  Resp: 19  18 18   Temp: 98.1 F (36.7 C)  98.5 F (36.9 C)   TempSrc: Oral  Oral   SpO2: 95%  94% 92%  Weight:  82.5 kg      Intake/Output Summary (Last 24 hours) at 05/23/2024 0828 Last data filed at 05/22/2024 2356 Gross per 24 hour  Intake 720 ml  Output 5150 ml  Net -4430 ml   Filed Weights   05/21/24 0428 05/21/24 2333 05/23/24 0004  Weight: 88 kg 87.3 kg 82.5 kg    Telemetry    NSR, PACs, atrial tach.  Occasional PVCs - Personally Reviewed  ECG    NA - Personally Reviewed  Physical Exam   GEN: No acute distress.   Neck: No  JVD Cardiac: RRR, no murmurs, rubs, or gallops.  Respiratory: Clear  to auscultation bilaterally. GI: Soft, nontender, non-distended  MS:    Left arm edema and bilateral mild ankle edema; No deformity. Neuro:  Nonfocal  Psych: Normal affect   Labs    Chemistry Recent Labs  Lab 05/19/24 1531 05/21/24 0306 05/22/24 0306 05/23/24 0226  NA 140 141 138 138  K 3.1* 3.5 3.3* 3.6  CL 103 104 104 98  CO2 24 27 30 28   GLUCOSE 102* 89 96  89  BUN <5* <5* 7* 8  CREATININE 0.81 0.76 0.75 0.82  CALCIUM  8.2* 7.9* 7.8* 8.5*  PROT 5.7*  --   --   --   ALBUMIN  2.5*  --   --   --   AST 28  --   --   --   ALT 24  --   --   --   ALKPHOS 128*  --   --   --   BILITOT 0.7  --   --   --   GFRNONAA >60 >60 >60 >60  ANIONGAP 13 10 4* 12     Hematology Recent Labs  Lab 05/19/24 1531  WBC 5.9  RBC 3.38*  HGB 11.0*  HCT 34.1*  MCV 100.9*  MCH 32.5  MCHC 32.3  RDW 13.5  PLT 263    Cardiac EnzymesNo results for input(s): TROPONINI in the last 168 hours. No results for input(s): TROPIPOC in the last 168 hours.   BNP Recent Labs  Lab 05/19/24 1531  BNP 895.8*     DDimer No results  for input(s): DDIMER in the last 168 hours.   Radiology    No results found.  Cardiac Studies   Echo:   1. Left ventricular ejection fraction, by estimation, is 60 to 65%. The  left ventricle has normal function. The left ventricle has no regional  wall motion abnormalities. Left ventricular diastolic parameters are  indeterminate.   2. Right ventricular systolic function is normal. The right ventricular  size is mildly enlarged. Tricuspid regurgitation signal is inadequate for  assessing PA pressure.   3. The mitral valve is grossly normal. Mild mitral valve regurgitation.   4. The aortic valve was not well visualized. Aortic valve regurgitation  is mild.   5. Aortic dilatation noted. There is mild dilatation of the ascending  aorta, measuring 42 mm.   6. The inferior vena cava is normal in size with greater than 50%  respiratory variability, suggesting right atrial pressure of 3 mmHg.   Patient Profile     73 y.o. male with a hx of hypertension, alcohol abuse, tobacco abuse, tobacco abuse, prostate cancer, choledocholithiasis s/p cholecystectomy drain on 04/08/2024, who is being seen 05/22/2024 for the evaluation of acute diastolic heart failure at the request of Cheryle Page MD.   Assessment & Plan    Acute diastolic HF:   Some of this might be a component of low albumen.  The echo is not adequate to look at diastole.  He likely will need some short term diuretic at home.    Left arm swelling:  I am still vague the cause of this.  There is an abnormal CT as mentioned and I will plan an out patient cardiac MRI to evaluate the SVC/RA junction and possible septal lipoma/lipomatous hypertrophy.   If this is unrevealing he will likely need imaging of his left upper vasculature with angiography.    For questions or updates, please contact [facility] Please consult [email or web address] for contact info under Cardiology/STEMI.   Signed, Lynwood Schilling, MD  05/23/2024, 8:28 AM

## 2024-05-23 NOTE — Progress Notes (Signed)
 DISCHARGE NOTE HOME Tony Lopez to be discharged Home per MD order. Discussed prescriptions and follow up appointments with the patient. Prescriptions given to patient; medication list explained in detail. Patient verbalized understanding.  Skin clean, dry and intact without evidence of skin break down, no evidence of skin tears noted. IV catheter discontinued intact. Site without signs and symptoms of complications. Dressing and pressure applied. Pt denies pain at the site currently. No complaints noted.  Patient free of lines, drains, and wounds.   An After Visit Summary (AVS) was printed and given to the patient. Patient escorted via wheelchair, and discharged home via private auto.  Peyton SHAUNNA Pepper, RN

## 2024-05-23 NOTE — TOC Transition Note (Signed)
 Transition of Care [facility]) - Discharge Note   Patient Details  Name: ONIS MARKOFF MRN: [id number] Date of Birth: Jan 24, 1951  [facility] CM/SW Contact:  [name], RN Phone Number: 05/23/2024, 10:45 AM   Clinical Narrative:    For dc today , has no needs.  Has transport today.         Patient Goals and CMS Choice            Discharge Placement                       Discharge Plan and Services Additional resources added to the After Visit Summary for                                       Social Drivers of Health (SDOH) Interventions SDOH Screenings   Food Insecurity: No Food Insecurity (05/23/2024)  Housing: Low Risk  (05/23/2024)  Transportation Needs: No Transportation Needs (05/23/2024)  Utilities: Not At Risk (05/23/2024)  Social Connections: Socially Isolated (05/23/2024)  Tobacco Use: High Risk (05/23/2024)     Readmission Risk Interventions     No data to display

## 2024-05-23 NOTE — Plan of Care (Signed)
   Problem: Education: Goal: Knowledge of General Education information will improve Description Including pain rating scale, medication(s)/side effects and non-pharmacologic comfort measures Outcome: Progressing   Problem: Clinical Measurements: Goal: Ability to maintain clinical measurements within normal limits will improve Outcome: Progressing   Problem: Activity: Goal: Risk for activity intolerance will decrease Outcome: Progressing

## 2024-05-23 NOTE — Progress Notes (Signed)
 Mobility Specialist Progress Note:    05/23/24 1009  Mobility  Activity Ambulated independently in hallway  Level of Assistance Independent  Assistive Device None  Distance Ambulated (ft) 500 ft  Activity Response Tolerated well  Mobility Referral Yes  Mobility visit 1 Mobility  Mobility Specialist Start Time (ACUTE ONLY) 1009  Mobility Specialist Stop Time (ACUTE ONLY) 1023  Mobility Specialist Time Calculation (min) (ACUTE ONLY) 14 min   Pt agreeable and eager to session. No c/o symptoms noted. Pt ind. No Ad. Left pt on EOB w/ all needs met.   Therisa Rana Mobility Specialist Please contact via SecureChat or  Rehab office at [phone]

## 2024-05-23 NOTE — Discharge Summary (Signed)
 Physician Discharge Summary  Tony Lopez FMW:[id number] DOB: August 22, 1951 DOA: 05/19/2024  PCP: Redmon, [place], [place]  Admit date: 05/19/2024 Discharge date: 05/23/2024  Admitted From: Home Disposition: Home  Recommendations for Outpatient Follow-up:  Follow up with PCP in 1 week with repeat CBC/BMP Follow up in ED if symptoms worsen or new appear   Home Health: No Equipment/Devices: None  Discharge Condition: Stable CODE STATUS: Full Diet recommendation: Heart healthy/fluid restriction of up to 1500 cc a day.  Brief/Interim Summary: 73 year old male with  history of hypertension, prostate CA, alcohol use, recently admitted with acute cholecystitis, choledocholithiasis, bacteremia complicated by alcohol withdrawal, treated with supportive care, antibiotics, underwent percutaneous cholecystostomy drain on 04/08/2024 and subsequently discharged to rehab few weeks ago presented with worsening lower extremity edema and dyspnea on exertion.  He was admitted with possible new diagnosis of acute diastolic CHF and started on IV diuretics.  2D echo showed EF of 60 to 65%.  Cardiology was consulted.  During the hospitalization, he has diuresed well.  He feels much better and feels okay to go home today.  Cardiology has cleared him for discharge on oral Lasix  as needed with outpatient follow-up with cardiology.  Discharge patient home today.  Discharge Diagnoses:   New diagnosis of possible acute diastolic CHF Hypoalbuminemia with third spacing -Echo showed EF of 60 to 65% - Continue diet and fluid restriction.  Treated with IV Lasix  with good diuresis.  Negative balance of 9335 cc since admission.  Cardiology following.  Cardiology has cleared him for discharge on oral Lasix  as needed with outpatient follow-up with cardiology.  Discharge patient home today. Continue metoprolol  and spironolactone .  Continue Lasix  20 mg daily as needed for fluid retention/swelling as per cardiology recommendations.    - Will need cardiac MRI as an outpatient as per cardiology  Hypokalemia - Improved.  Outpatient follow-up. [name] History of alcohol use -Recent hospitalization with alcohol withdrawal.  Denies recent alcohol use but he has been in rehab for the last few weeks - Continue multivitamin, thiamine  and folic acid .   History of cholecystitis, choledocholithiasis with polymicrobial bacteremia -Completed course of antibiotics.  Treated with ERCP by [facility] on 04/28/2024 with CBD stent placed.  Outpatient follow-up with GI. -Status post percutaneous cholecystostomy 04/08/24, this was subsequently removed by IR    Tobacco abuse - Counseled regarding cessation by prior hospitalist.    Discharge Instructions  Discharge Instructions     Diet - low sodium heart healthy   Complete by: As directed    Increase activity slowly   Complete by: As directed       Allergies as of 05/23/2024   No Known Allergies      Medication List     STOP taking these medications    amoxicillin -clavulanate 875-125 MG tablet Commonly known as: AUGMENTIN    levofloxacin  750 MG tablet Commonly known as: LEVAQUIN    nicotine  21 mg/24hr patch Commonly known as: NICODERM CQ  - dosed in mg/24 hours   potassium chloride  10 MEQ tablet Commonly known as: KLOR-CON        TAKE these medications    allopurinol  100 MG tablet Commonly known as: ZYLOPRIM  Take 100 mg by mouth daily.   atorvastatin  10 MG tablet Commonly known as: LIPITOR Take 10 mg by mouth every evening.   folic acid  1 MG tablet Commonly known as: FOLVITE  Take 1 tablet (1 mg total) by mouth daily.   furosemide  20 MG tablet Commonly known as: Lasix  Take 1 tablet (20 mg total) by mouth  daily as needed for fluid or edema.   metoprolol  tartrate 25 MG tablet Commonly known as: LOPRESSOR  Take 1 tablet (25 mg total) by mouth 2 (two) times daily.   multivitamin with minerals Tabs tablet Take 1 tablet by mouth daily.   pantoprazole  40 MG  tablet Commonly known as: PROTONIX  Take 1 tablet (40 mg total) by mouth 2 (two) times daily.   spironolactone  25 MG tablet Commonly known as: ALDACTONE  Take 1 tablet (25 mg total) by mouth daily. Start taking on: May 24, 2024   thiamine  100 MG tablet Commonly known as: Vitamin B-1 Take 1 tablet (100 mg total) by mouth daily.   traMADol  50 MG tablet Commonly known as: Ultram  Take 1 tablet (50 mg total) by mouth every 6 (six) hours as needed.        Follow-up Information     Redmon, [place], [name]. Schedule an appointment as soon as possible for a visit in 1 week(s).   Specialty: Physician Assistant Why: with repeat cbc/bmp Contact information: [address] [place] [address] [phone]                No Known Allergies  Consultations: Cardiology   Procedures/Studies: ECHOCARDIOGRAM LIMITED Result Date: 05/20/2024    ECHOCARDIOGRAM LIMITED REPORT   Patient Name:   Tony Lopez Date of Exam: 05/20/2024 Medical Rec #:  [id number]          Height:       72.0 in Accession #:    [id number]         Weight:       194.7 lb Date of Birth:  [birth year]         BSA:          2.106 m Patient Age:    72 years           BP:           172/85 mmHg Patient Gender: M                  HR:           75 bpm. Exam Location:  Inpatient Procedure: Limited Echo, Cardiac Doppler and Color Doppler (Both Spectral and            Color Flow Doppler were utilized during procedure). Indications:    I50.40* Unspecified combined systolic (congestive) and diastolic                 (congestive) heart failure. Elevated BNP  History:        Patient has prior history of Echocardiogram examinations, most                 recent 04/28/2024. Abnormal ECG, Arrythmias:Tachycardia,                 Signs/Symptoms:Bacteremia and Edema; Risk Factors:Hypertension.                 Cancer. ETOH.  Sonographer:    Ellouise Mose RDCS Referring Phys: [id number] [facility]  Sonographer Comments: Technically  difficult study due to poor echo windows. IMPRESSIONS  1. Left ventricular ejection fraction, by estimation, is 60 to 65%. The left ventricle has normal function. The left ventricle has no regional wall motion abnormalities. Left ventricular diastolic parameters are indeterminate.  2. Right ventricular systolic function is normal. The right ventricular size is mildly enlarged. Tricuspid regurgitation signal is inadequate for assessing PA pressure.  3. The mitral valve is grossly normal.  Mild mitral valve regurgitation.  4. The aortic valve was not well visualized. Aortic valve regurgitation is mild.  5. Aortic dilatation noted. There is mild dilatation of the ascending aorta, measuring 42 mm.  6. The inferior vena cava is normal in size with greater than 50% respiratory variability, suggesting right atrial pressure of 3 mmHg. Comparison(s): No significant change from prior study. Prior images reviewed side by side. FINDINGS  Left Ventricle: Left ventricular ejection fraction, by estimation, is 60 to 65%. The left ventricle has normal function. The left ventricle has no regional wall motion abnormalities. The left ventricular internal cavity size was normal in size. There is  no left ventricular hypertrophy. Left ventricular diastolic parameters are indeterminate. Right Ventricle: The right ventricular size is mildly enlarged. Right ventricular systolic function is normal. Tricuspid regurgitation signal is inadequate for assessing PA pressure. Mitral Valve: The mitral valve is grossly normal. Mild mitral valve regurgitation. Tricuspid Valve: The tricuspid valve is normal in structure. Tricuspid valve regurgitation is not demonstrated. No evidence of tricuspid stenosis. Aortic Valve: The aortic valve was not well visualized. Aortic valve regurgitation is mild. Aorta: Aortic dilatation noted. There is mild dilatation of the ascending aorta, measuring 42 mm. Venous: The inferior vena cava is normal in size with greater  than 50% respiratory variability, suggesting right atrial pressure of 3 mmHg. Additional Comments: Spectral Doppler performed. Color Doppler performed.  LEFT VENTRICLE PLAX 2D LVIDd:         4.80 cm LVIDs:         3.10 cm LV PW:         2.60 cm LV IVS:        1.10 cm  LV Volumes (MOD) LV vol d, MOD A2C: 89.1 ml LV vol d, MOD A4C: 97.2 ml LV vol s, MOD A2C: 32.4 ml LV vol s, MOD A4C: 39.4 ml LV SV MOD A2C:     56.7 ml LV SV MOD A4C:     97.2 ml LV SV MOD BP:      58.1 ml IVC IVC diam: 1.80 cm  AORTA Ao Root diam: 4.40 cm Ao Asc diam:  4.13 cm MITRAL VALVE MV Area (PHT): 3.65 cm MV Decel Time: 208 msec MV E velocity: 73.30 cm/s MV A velocity: 107.00 cm/s MV E/A ratio:  0.69 Stanly Leavens MD Electronically signed by Stanly Leavens MD Signature Date/Time: 05/20/2024/11:39:07 AM    Final    CT Chest Wo Contrast Result Date: 05/20/2024 CLINICAL DATA:  Abnormal chest x-ray, pulmonary nodule EXAM: CT CHEST WITHOUT CONTRAST TECHNIQUE: Multidetector CT imaging of the chest was performed following the standard protocol without IV contrast. RADIATION DOSE REDUCTION: This exam was performed according to the departmental dose-optimization program which includes automated exposure control, adjustment of the mA and/or kV according to patient size and/or use of iterative reconstruction technique. COMPARISON:  None Available. FINDINGS: Cardiovascular: No significant coronary artery calcification. Lipomatous hypertrophy of the intra-atrial septum. Resultant high-grade narrowing of the superior vena cava at the cavoatrial junction (107/3). Global cardiac size within normal limits. No pericardial effusion. Central pulmonary arteries are of normal caliber. There is fusiform dilation of the ascending aorta measuring 4.1 cm in greatest dimension. Descending thoracic aorta is of normal caliber. Mild superimposed atherosclerotic calcification. Mediastinum/Nodes: No enlarged mediastinal or axillary lymph nodes. Thyroid gland,  trachea, and esophagus demonstrate no significant findings. Lungs/Pleura: Small bilateral pleural effusions with associated bibasilar compressive atelectasis. Mild emphysema. Bronchial wall thickening in keeping with airway inflammation. Opacity seen at the left  lung base corresponds to a small area of parenchymal scarring within basilar lingula. No focal pulmonary nodule or infiltrate. No pneumothorax. No central obstructing lesion. Upper Abdomen: At least moderate hepatic steatosis. Internal biliary stent is partially visualized. Pneumobilia present suggesting patency of the stented segment. No acute abnormality. Musculoskeletal: No chest wall mass or suspicious bone lesions identified. IMPRESSION: 1. Small bilateral pleural effusions with associated bibasilar compressive atelectasis. 2. Mild emphysema.  Airway inflammation. 3. Lipomatous hypertrophy of the intra-atrial septum. Resultant high-grade narrowing of the superior vena cava at the cavoatrial junction. 4. Fusiform dilation of the ascending aorta measuring 4.1 cm in greatest dimension. Recommend annual imaging followup by CTA or MRA. This recommendation follows 2010 ACCF/AHA/AATS/ACR/ASA/SCA/SCAI/SIR/STS/SVM Guidelines for the Diagnosis and Management of Patients with Thoracic Aortic Disease. Circulation. 2010; 121: Z733-z630. Aortic aneurysm NOS (ICD10-I71.9) 5. Moderate hepatic steatosis. 6. Internal biliary stent is partially visualized. Pneumobilia present suggesting patency of the stented segment. Aortic Atherosclerosis (ICD10-I70.0) and Emphysema (ICD10-J43.9). Electronically Signed   By: Dorethia Molt M.D.   On: 05/20/2024 02:49   VAS US  LOWER EXTREMITY VENOUS (DVT) (ONLY [facility] & [facility]) Result Date: 05/19/2024  Lower Venous DVT Study Patient Name:  Tony Lopez  Date of Exam:   05/19/2024 Medical Rec #: [id number]           Accession #:    [id number] Date of Birth: 1951/06/11          Patient Gender: M Patient Age:   64 years Exam Location:  [facility] Procedure:      VAS US  LOWER EXTREMITY VENOUS (DVT) Referring Phys: RANKIN RIVER --------------------------------------------------------------------------------  Indications: Edema.  Performing Technologist: Elmarie Lindau, RVT  Examination Guidelines: A complete evaluation includes B-mode imaging, spectral Doppler, color Doppler, and power Doppler as needed of all accessible portions of each vessel. Bilateral testing is considered an integral part of a complete examination. Limited examinations for reoccurring indications may be performed as noted. The reflux portion of the exam is performed with the patient in reverse Trendelenburg.  +-----+---------------+---------+-----------+----------+--------------+ RIGHTCompressibilityPhasicitySpontaneityPropertiesThrombus Aging +-----+---------------+---------+-----------+----------+--------------+ CFV  Full           Yes      Yes                                 +-----+---------------+---------+-----------+----------+--------------+   +---------+---------------+---------+-----------+----------+-------------------+ LEFT     CompressibilityPhasicitySpontaneityPropertiesThrombus Aging      +---------+---------------+---------+-----------+----------+-------------------+ CFV      Full           Yes      Yes                                      +---------+---------------+---------+-----------+----------+-------------------+ SFJ      Full                                                             +---------+---------------+---------+-----------+----------+-------------------+ FV Prox  Full                                                             +---------+---------------+---------+-----------+----------+-------------------+  FV Mid   Full                                                             +---------+---------------+---------+-----------+----------+-------------------+ FV DistalFull                                                              +---------+---------------+---------+-----------+----------+-------------------+ PFV      Full                                                             +---------+---------------+---------+-----------+----------+-------------------+ POP      Full           Yes      Yes                                      +---------+---------------+---------+-----------+----------+-------------------+ PTV      Full                                                             +---------+---------------+---------+-----------+----------+-------------------+ PERO                                                  Not well visualized +---------+---------------+---------+-----------+----------+-------------------+ The peroneal veins are not seen well.     Summary: RIGHT: - No evidence of common femoral vein obstruction.   LEFT: - There is no evidence of deep vein thrombosis in the lower extremity. However, portions of this examination were limited- see technologist comments above.  - No cystic structure found in the popliteal fossa.  *See table(s) above for measurements and observations. Electronically signed by Lonni Gaskins MD on 05/19/2024 at 6:11:48 PM.    Final    UE Venous Duplex ([facility] and [facility] ONLY) Result Date: 05/19/2024 UPPER VENOUS STUDY  Patient Name:  Tony Lopez  Date of Exam:   05/19/2024 Medical Rec #: [id number]           Accession #:    [id number] Date of Birth: 05/18/1951          Patient Gender: M Patient Age:   29 years Exam Location:  [facility] Procedure:      VAS US  UPPER EXTREMITY VENOUS DUPLEX Referring Phys: RANKIN RIVER --------------------------------------------------------------------------------  Indications: Edema Performing Technologist: Elmarie Lindau, RVT  Examination Guidelines: A complete evaluation includes B-mode imaging, spectral Doppler, color Doppler, and power Doppler as needed of all accessible portions  of each vessel. Bilateral testing is considered an integral part of  a complete examination. Limited examinations for reoccurring indications may be performed as noted.  Right Findings: +----------+------------+---------+-----------+----------+-------+ RIGHT     CompressiblePhasicitySpontaneousPropertiesSummary +----------+------------+---------+-----------+----------+-------+ Subclavian               Yes                                +----------+------------+---------+-----------+----------+-------+  Left Findings: +----------+------------+---------+-----------+----------+-------+ LEFT      CompressiblePhasicitySpontaneousPropertiesSummary +----------+------------+---------+-----------+----------+-------+ IJV           Full       Yes       Yes                      +----------+------------+---------+-----------+----------+-------+ Subclavian               Yes                                +----------+------------+---------+-----------+----------+-------+ Axillary      Full                                          +----------+------------+---------+-----------+----------+-------+ Brachial      Full                                          +----------+------------+---------+-----------+----------+-------+ Radial        Full                                          +----------+------------+---------+-----------+----------+-------+ Ulnar         Full                                          +----------+------------+---------+-----------+----------+-------+ Cephalic      Full                                          +----------+------------+---------+-----------+----------+-------+ Basilic       Full                                          +----------+------------+---------+-----------+----------+-------+  Summary:  Right: No evidence of thrombosis in the subclavian.  Left: No evidence of deep vein thrombosis in the upper extremity.  *See  table(s) above for measurements and observations.  Diagnosing physician: Lonni Gaskins MD Electronically signed by Lonni Gaskins MD on 05/19/2024 at 6:11:31 PM.    Final    DG Chest 2 View Result Date: 05/19/2024 CLINICAL DATA:  Edema EXAM: CHEST - 2 VIEW COMPARISON:  Chest x-ray 04/21/2024 FINDINGS: There is some strandy opacities in the retrocardiac region. There is a focal opacity near the left costophrenic angle, slightly nodular. Small pleural effusions are present bilaterally. The cardiomediastinal silhouette is within normal limits. There are questionable calcified right hilar lymph nodes.  There is no pneumothorax or acute fracture. IMPRESSION: 1. Small bilateral pleural effusions. 2. Strandy opacities in the retrocardiac region may represent atelectasis or infection. 3. Focal opacity near the left costophrenic angle, slightly nodular. Recommend further evaluation with CT. Electronically Signed   By: Greig Pique M.D.   On: 05/19/2024 15:54   ECHOCARDIOGRAM COMPLETE Result Date: 04/28/2024    ECHOCARDIOGRAM REPORT   Patient Name:   Tony Lopez Date of Exam: 04/28/2024 Medical Rec #:  [id number]          Height:       72.0 in Accession #:    [id number]         Weight:       194.7 lb Date of Birth:  1950-12-06         BSA:          2.106 m Patient Age:    72 years           BP:           122/65 mmHg Patient Gender: M                  HR:           67 bpm. Exam Location:  Inpatient Procedure: 2D Echo, Cardiac Doppler and Color Doppler (Both Spectral and Color            Flow Doppler were utilized during procedure). Indications:    Bacteremia  History:        Patient has no prior history of Echocardiogram examinations.  Sonographer:    Philomena Daring Referring Phys: DONALDA HERO [facility]  Sonographer Comments: Suboptimal apical window and Technically difficult study due to poor echo windows. Image acquisition challenging due to patient body habitus. IMPRESSIONS  1. Left ventricular ejection fraction,  by estimation, is 60 to 65%. The left ventricle has normal function. The left ventricle has no regional wall motion abnormalities. Left ventricular diastolic parameters were normal.  2. Right ventricular systolic function was not well visualized. The right ventricular size is mildly enlarged. RV systolic function appears to be normal.  3. The mitral valve is grossly normal. No evidence of mitral valve regurgitation. No evidence of mitral stenosis.  4. The aortic valve is grossly normal. Aortic valve regurgitation is not visualized. No aortic stenosis is present.  5. The inferior vena cava is dilated in size with <50% respiratory variability, suggesting right atrial pressure of 15 mmHg.  6. Valves not well visualized, consider TEE if clinically indicated for endocarditis. FINDINGS  Left Ventricle: Left ventricular ejection fraction, by estimation, is 60 to 65%. The left ventricle has normal function. The left ventricle has no regional wall motion abnormalities. The left ventricular internal cavity size was normal in size. There is  no left ventricular hypertrophy. Left ventricular diastolic parameters were normal. Right Ventricle: The right ventricular size is mildly enlarged. No increase in right ventricular wall thickness. Right ventricular systolic function was not well visualized. Left Atrium: Left atrial size was normal in size. Right Atrium: Right atrial size was normal in size. Pericardium: There is no evidence of pericardial effusion. Mitral Valve: The mitral valve is grossly normal. No evidence of mitral valve regurgitation. No evidence of mitral valve stenosis. Tricuspid Valve: The tricuspid valve is grossly normal. Tricuspid valve regurgitation is not demonstrated. No evidence of tricuspid stenosis. Aortic Valve: The aortic valve is grossly normal. Aortic valve regurgitation is not visualized. No aortic stenosis is present. Pulmonic Valve: The pulmonic valve was  not well visualized. Pulmonic valve  regurgitation is not visualized. No evidence of pulmonic stenosis. Aorta: The aortic root is normal in size and structure. Venous: The inferior vena cava is dilated in size with less than 50% respiratory variability, suggesting right atrial pressure of 15 mmHg. IAS/Shunts: No atrial level shunt detected by color flow Doppler.  LEFT VENTRICLE PLAX 2D LVIDd:         4.90 cm   Diastology LVIDs:         3.10 cm   LV e' medial:    7.51 cm/s LV PW:         1.00 cm   LV E/e' medial:  10.7 LV IVS:        1.10 cm   LV e' lateral:   10.40 cm/s LVOT diam:     2.00 cm   LV E/e' lateral: 7.8 LV SV:         45 LV SV Index:   21 LVOT Area:     3.14 cm  RIGHT VENTRICLE             IVC RV S prime:     14.40 cm/s  IVC diam: 2.30 cm TAPSE (M-mode): 2.0 cm LEFT ATRIUM             Index       RIGHT ATRIUM          Index LA diam:        2.70 cm 1.28 cm/m  RA Area:     7.14 cm LA Vol (A2C):   13.7 ml 6.50 ml/m  RA Volume:   13.00 ml 6.17 ml/m LA Vol (A4C):   19.9 ml 9.45 ml/m LA Biplane Vol: 16.6 ml 7.88 ml/m  AORTIC VALVE LVOT Vmax:   65.50 cm/s LVOT Vmean:  42.400 cm/s LVOT VTI:    0.143 m  AORTA Ao Root diam: 3.70 cm MITRAL VALVE MV Area (PHT): 4.06 cm    SHUNTS MV Decel Time: 187 msec    Systemic VTI:  0.14 m MV E velocity: 80.70 cm/s  Systemic Diam: 2.00 cm MV A velocity: 77.00 cm/s MV E/A ratio:  1.05 Aditya Sabharwal Electronically signed by Ria Commander Signature Date/Time: 04/28/2024/1:33:05 PM    Final    DG ERCP Result Date: 04/28/2024 CLINICAL DATA:  Treatment of bile leak post subtotal cholecystectomy EXAM: ERCP TECHNIQUE: Multiple spot images obtained with the fluoroscopic device and submitted for interpretation post-procedure. FLUOROSCOPY: Radiation Exposure Index (as provided by the fluoroscopic device): 23.84 mGy Kerma COMPARISON:  None Available. FINDINGS: Ten fluoroscopic images of the right upper quadrant performed during endoscopy. Images demonstrate flexible endoscope with guidewire in the common bile  duct contrast injection demonstrates filling of the mildly dilated common bile duct with filling defects within the distal duct suggesting choledocholithiasis. Balloon sweep of the common bile duct with subsequent removal of choledocholithiasis. Contrast is seen in the cystic duct and extravasated into the gallbladder fossa. Final image demonstrates interval placement of a plastic stent spanning the common bile duct IMPRESSION: ERCP demonstrating cystic duct leak. Choledocholithiasis treated with balloon sweep. Common bile duct stent placement. These images were submitted for radiologic interpretation only. Please see the procedural report for the amount of contrast and the fluoroscopy time utilized. Electronically Signed   By: Cordella Banner   On: 04/28/2024 12:38   NM Hepatobiliary Liver Func Result Date: 04/23/2024 CLINICAL DATA:  Right upper quadrant abdominal pain concern for acalculous cholecystitis. EXAM: NUCLEAR MEDICINE HEPATOBILIARY IMAGING TECHNIQUE: Sequential images  of the abdomen were obtained out to 60 minutes following intravenous administration of radiopharmaceutical. RADIOPHARMACEUTICALS:  7.5 mCi Tc-64m  Choletec  IV COMPARISON:  Ultrasound Apr 22, 2024 FINDINGS: Prompt uptake and biliary excretion of activity by the liver is seen. Gallbladder activity is not visualized. Biliary activity passes into small bowel, consistent with patent common bile duct. IMPRESSION: Non visualization of the gallbladder compatible with occluded cystic duct/cholecystitis. Electronically Signed   By: Reyes Holder M.D.   On: 04/23/2024 15:39      Subjective: Patient seen and examined at bedside.  Feels better and wants to go home today.  Denies worsening shortness of breath, chest pain or vomiting.  Discharge Exam: Vitals:   05/23/24 0728 05/23/24 0910  BP: (!) 142/71 (!) 142/71  Pulse: 76 76  Resp: 18   Temp: 98 F (36.7 C)   SpO2: 92%     General: Pt is alert, awake, not in acute distress.  On  room air. Cardiovascular: rate controlled, S1/S2 + Respiratory: bilateral decreased breath sounds at bases with basilar crackles Abdominal: Soft, NT, ND, bowel sounds + Extremities: Left arm edema and bilateral mild ankle edema present; no cyanosis    The results of significant diagnostics from this hospitalization (including imaging, microbiology, ancillary and laboratory) are listed below for reference.     Microbiology: No results found for this or any previous visit (from the past 240 hours).   Labs: BNP (last 3 results) Recent Labs    04/30/24 0355 05/01/24 0421 05/19/24 1531  BNP 319.0* 254.3* 895.8*   Basic Metabolic Panel: Recent Labs  Lab 05/19/24 1531 05/21/24 0306 05/22/24 0306 05/23/24 0226  NA 140 141 138 138  K 3.1* 3.5 3.3* 3.6  CL 103 104 104 98  CO2 24 27 30 28   GLUCOSE 102* 89 96 89  BUN <5* <5* 7* 8  CREATININE 0.81 0.76 0.75 0.82  CALCIUM  8.2* 7.9* 7.8* 8.5*  MG  --   --   --  1.1*   Liver Function Tests: Recent Labs  Lab 05/19/24 1531  AST 28  ALT 24  ALKPHOS 128*  BILITOT 0.7  PROT 5.7*  ALBUMIN  2.5*   No results for input(s): LIPASE, AMYLASE in the last 168 hours. No results for input(s): AMMONIA in the last 168 hours. CBC: Recent Labs  Lab 05/19/24 1531  WBC 5.9  NEUTROABS 3.9  HGB 11.0*  HCT 34.1*  MCV 100.9*  PLT 263   Cardiac Enzymes: No results for input(s): CKTOTAL, CKMB, CKMBINDEX, TROPONINI in the last 168 hours. BNP: Invalid input(s): POCBNP CBG: Recent Labs  Lab 05/20/24 0750  GLUCAP 88   D-Dimer No results for input(s): DDIMER in the last 72 hours. Hgb A1c No results for input(s): HGBA1C in the last 72 hours. Lipid Profile No results for input(s): CHOL, HDL, LDLCALC, TRIG, CHOLHDL, LDLDIRECT in the last 72 hours. Thyroid function studies No results for input(s): TSH, T4TOTAL, T3FREE, THYROIDAB in the last 72 hours.  Invalid input(s): FREET3 Anemia work up No  results for input(s): VITAMINB12, FOLATE, FERRITIN, TIBC, IRON, RETICCTPCT in the last 72 hours. Urinalysis    Component Value Date/Time   COLORURINE AMBER (A) 04/21/2024 1114   APPEARANCEUR CLOUDY (A) 04/21/2024 1114   LABSPEC 1.027 04/21/2024 1114   PHURINE 5.0 04/21/2024 1114   GLUCOSEU NEGATIVE 04/21/2024 1114   HGBUR MODERATE (A) 04/21/2024 1114   BILIRUBINUR NEGATIVE 04/21/2024 1114   KETONESUR 5 (A) 04/21/2024 1114   PROTEINUR 30 (A) 04/21/2024 1114   NITRITE NEGATIVE  04/21/2024 1114   LEUKOCYTESUR NEGATIVE 04/21/2024 1114   Sepsis Labs Recent Labs  Lab 05/19/24 1531  WBC 5.9   Microbiology No results found for this or any previous visit (from the past 240 hours).   Time coordinating discharge: 35 minutes  SIGNED:   Sophie Mao, MD  [facility] 05/23/2024, 10:44 AM

## 2024-06-13 ENCOUNTER — Encounter: Payer: Self-pay | Admitting: Advanced Practice Midwife

## 2024-06-19 ENCOUNTER — Encounter: Payer: Self-pay | Admitting: *Deleted

## 2024-06-20 ENCOUNTER — Encounter: Payer: Self-pay | Admitting: Emergency Medicine

## 2024-06-20 ENCOUNTER — Ambulatory Visit: Attending: Emergency Medicine | Admitting: Emergency Medicine

## 2024-06-20 VITALS — BP 126/82 | HR 98 | Ht 71.0 in | Wt 181.6 lb

## 2024-06-20 DIAGNOSIS — F1090 Alcohol use, unspecified, uncomplicated: Secondary | ICD-10-CM

## 2024-06-20 DIAGNOSIS — I5032 Chronic diastolic (congestive) heart failure: Secondary | ICD-10-CM | POA: Diagnosis not present

## 2024-06-20 DIAGNOSIS — I5031 Acute diastolic (congestive) heart failure: Secondary | ICD-10-CM

## 2024-06-20 DIAGNOSIS — I77810 Thoracic aortic ectasia: Secondary | ICD-10-CM

## 2024-06-20 DIAGNOSIS — Z79899 Other long term (current) drug therapy: Secondary | ICD-10-CM | POA: Diagnosis not present

## 2024-06-20 DIAGNOSIS — Z720 Tobacco use: Secondary | ICD-10-CM

## 2024-06-20 NOTE — Patient Instructions (Addendum)
 Medication Instructions:  TAKE YOUR LASIX  AS NEEDED. CONTINUE ALL OTHER MEDICATION THERAPY.  Lab Work: BMET TO BE DONE TODAY.  Testing/Procedures: NONE  Follow-Up: At [facility], you and your health needs are our priority.  As part of our continuing mission to provide you with exceptional heart care, our providers are all part of one team.  This team includes your primary Cardiologist (physician) and Advanced Practice Providers or APPs (Physician Assistants and Nurse Practitioners) who all work together to provide you with the care you need, when you need it.  Your next appointment:   3 MONTHS  Provider:   Lynwood Schilling, MD    We recommend signing up for the patient portal called MyChart.  Sign up information is provided on this After Visit Summary.  MyChart is used to connect with patients for Virtual Visits (Telemedicine).  Patients are able to view lab/test results, encounter notes, upcoming appointments, etc.  Non-urgent messages can be sent to your provider as well.   To learn more about what you can do with MyChart, go to [email or web address].

## 2024-06-20 NOTE — Progress Notes (Signed)
 Cardiology Office Note:    Date:  06/21/2024  ID:  Tony Lopez, DOB Jan 13, 1951, MRN [id number] PCP: Alvera Reagin, PA  [facility] Providers Cardiologist:  Lynwood Schilling, MD       Patient Profile:       Chief Complaint: Hospital follow-up History of Present Illness:  Tony Lopez is a 73 y.o. male with visit-pertinent history of hypertension, alcohol abuse, tobacco abuse, prostate cancer, choledocholithiasis s/p cholecystectomy drain on 04/08/2024, diastolic heart failure  The patient was in a rehab facility after having polymicrobial bacteremia secondary to choledocholithiasis.  He ended up receiving a subtotal cholecystectomy on 04/25/2024 a few days later he received a biliary stent.   He presented to the hospital on 05/19/2024 with complaints of left leg and arm swelling as well as shortness of breath.  Cardiology was consulted for acute diastolic heart failure.  He had an elevated BNP of 895 and high-sensitivity troponins of 58, 57.  Upper and lower extremity Dopplers were negative for DVT.  Chest CT showed Small bilateral pleural effusions with associated bibasilar compressive atelectasis. Mild emphysema. Airway inflammation. Lipomatous hypertrophy of the intra-atrial septum. Resultant high-grade narrowing of the superior vena cava at the cavoatrial junction. Fusiform dilation of the ascending aorta measuring 4.1 cm in greatest dimension. Moderate hepatic steatosis. Internal biliary stent is partially visualized. Pneumobilia present suggesting patency of the stented segment.  Echocardiogram on 05/20/2024 showed LVEF 60 to 65%, no RWMA, mild mitral valve regurgitation, mild aortic regurgitation, mild dilation of ascending aorta measuring 42 mm.  He was started on IV Lasix  with net -4.9L and shortness of breath improved.  For his CT suggesting lipomatous changes to the atrial septum with some question of obstruction of the superior vena cava, plan will be for outpatient  MRI.   Discussed the use of AI scribe software for clinical note transcription with the patient, who gave verbal consent to proceed.  History of Present Illness Tony Lopez is a 73 year old male who presents for follow-up after recent hospitalization for diastolic heart failure. He is accompanied by his daughter.  Today he is doing well without acute cardiovascular concerns or complaints.  He denies any chest pain or exertional symptoms.  He tells me he feels much improved.  He continues to take his Lasix  daily and is without any orthopnea, PND, or leg swelling.  He tells me the swelling in his left arm/elbow have entirely resolved.  He has some mild dyspnea on exertion that has improved.  He has a history of alcohol use, which he increased due to severe neck pain and muscle spasms. He stopped drinking alcohol upon hospital admission and continues not to drink.  He has a history of smoking, contributing to mild COPD and emphysema. He currently smokes about two cigarettes a day, sometimes abstaining for several days when using nicotine  patches.  He is without any dizziness, lightheadedness, syncope, presyncope, palpitations.  Review of systems:  Please see the history of present illness. All other systems are reviewed and otherwise negative.      Studies Reviewed:        Echocardiogram 05/20/2024 1. Left ventricular ejection fraction, by estimation, is 60 to 65%. The  left ventricle has normal function. The left ventricle has no regional  wall motion abnormalities. Left ventricular diastolic parameters are  indeterminate.   2. Right ventricular systolic function is normal. The right ventricular  size is mildly enlarged. Tricuspid regurgitation signal is inadequate for  assessing PA pressure.  3. The mitral valve is grossly normal. Mild mitral valve regurgitation.   4. The aortic valve was not well visualized. Aortic valve regurgitation  is mild.   5. Aortic dilatation noted.  There is mild dilatation of the ascending  aorta, measuring 42 mm.   6. The inferior vena cava is normal in size with greater than 50%  respiratory variability, suggesting right atrial pressure of 3 mmHg.   Echocardiogram 04/28/2024  1. Left ventricular ejection fraction, by estimation, is 60 to 65%. The  left ventricle has normal function. The left ventricle has no regional  wall motion abnormalities. Left ventricular diastolic parameters were  normal.   2. Right ventricular systolic function was not well visualized. The right  ventricular size is mildly enlarged. RV systolic function appears to be  normal.   3. The mitral valve is grossly normal. No evidence of mitral valve  regurgitation. No evidence of mitral stenosis.   4. The aortic valve is grossly normal. Aortic valve regurgitation is not  visualized. No aortic stenosis is present.   5. The inferior vena cava is dilated in size with <50% respiratory  variability, suggesting right atrial pressure of 15 mmHg.   6. Valves not well visualized, consider TEE if clinically indicated for  endocarditis.    Risk Assessment/Calculations:              Physical Exam:   VS:  BP 126/82 (BP Location: Left Arm, Patient Position: Sitting, Cuff Size: Normal)   Pulse 98   Ht 5' 11 (1.803 m)   Wt 181 lb 9.6 oz (82.4 kg)   SpO2 99%   BMI 25.33 kg/m    Wt Readings from Last 3 Encounters:  06/20/24 181 lb 9.6 oz (82.4 kg)  05/23/24 181 lb 14.1 oz (82.5 kg)  04/23/24 194 lb 10.7 oz (88.3 kg)    GEN: Well nourished, well developed in no acute distress NECK: No JVD; No carotid bruits CARDIAC: RRR, no murmurs, rubs, gallops RESPIRATORY:  Clear to auscultation without rales, wheezing or rhonchi  ABDOMEN: Soft, non-tender, non-distended EXTREMITIES:  No edema; No acute deformity      Assessment and Plan:  Diastolic heart failure Hypoalbuminemia Echocardiogram 05/20/2024 with LVEF 60 to 65%, no RWMA, indeterminate diastolic  parameters Echocardiogram 04/28/2024 with LVEF 60 to 65%, no RWMA, normal diastolic parameters Admitted 04/2024 for acute diastolic HF with BNP 895 and chest CT with small bilateral pleural effusions.  Symptoms improved with IV diuresis with net -9L. - Hypoalbuminemia with third spacing likely contributing with malnutrition and albumin  of 1.8 on admission.  Admitted 03/2024 for alcohol withdrawal and DTs.  No alcohol use over the past month - NYHA class II  - Today patient appears euvolemic and well compensated on exam.  No signs of volume overload.  Weight is stable.  No leg swelling, ascites, and left arm swelling has entirely resolved.  His dyspnea is much improved without orthopnea or PND - Continue Lasix  20 mg daily as needed, metoprolol  tartrate 25 mg twice daily, and spironolactone  25 mg daily  Dilation of ascending aorta CT chest 04/2024 showed fusiform dilation of the ascending aorta measuring 4.1 cm - Will need annual CTA for routine surveillance  Lipomatous hypertrophy of the intra-atrial septum CT chest 04/2024 showed lipomatous hypertrophy of the intra-atrial septum with high-grade narrowing of the superior vena cava at the cavoatrial junction - MRI pending to evaluate the SVC/RA junction scheduled for 8/22  Tobacco use Currently down to 2 cigarettes daily - Complete  cessation advised  Alcohol use Admitted 03/2024 with alcohol withdrawal - He has been without any alcohol use over the past month  Left arm swelling His left arm swelling has entirely resolved      Dispo:  Return in about 3 months (around 09/20/2024).  Signed, Lum LITTIE Louis, NP

## 2024-06-21 ENCOUNTER — Encounter: Payer: Self-pay | Admitting: Emergency Medicine

## 2024-06-21 LAB — BASIC METABOLIC PANEL WITH GFR
BUN/Creatinine Ratio: 12 (ref 10–24)
BUN: 16 mg/dL (ref 8–27)
CO2: 18 mmol/L — ABNORMAL LOW (ref 20–29)
Calcium: 8.9 mg/dL (ref 8.6–10.2)
Chloride: 98 mmol/L (ref 96–106)
Creatinine, Ser: 1.31 mg/dL — ABNORMAL HIGH (ref 0.76–1.27)
Glucose: 114 mg/dL — ABNORMAL HIGH (ref 70–99)
Potassium: 4.4 mmol/L (ref 3.5–5.2)
Sodium: 137 mmol/L (ref 134–144)
eGFR: 58 mL/min/1.73 — ABNORMAL LOW (ref >59)

## 2024-06-23 ENCOUNTER — Ambulatory Visit: Payer: Self-pay | Admitting: Emergency Medicine

## 2024-07-08 ENCOUNTER — Other Ambulatory Visit: Payer: Self-pay | Admitting: Physician Assistant

## 2024-07-08 DIAGNOSIS — R935 Abnormal findings on diagnostic imaging of other abdominal regions, including retroperitoneum: Secondary | ICD-10-CM

## 2024-07-09 ENCOUNTER — Ambulatory Visit
Admission: RE | Admit: 2024-07-09 | Discharge: 2024-07-09 | Disposition: A | Discharge status: Home / Self Care | Source: Ambulatory Visit | Attending: Physician Assistant | Admitting: Physician Assistant

## 2024-07-09 DIAGNOSIS — R935 Abnormal findings on diagnostic imaging of other abdominal regions, including retroperitoneum: Secondary | ICD-10-CM

## 2024-07-09 MED ORDER — GADOPICLENOL 0.5 MMOL/ML IV SOLN
8.0000 mL | Freq: Once | INTRAVENOUS | Status: AC | PRN
  Administered 2024-07-09 (×2): 8 mL via INTRAVENOUS

## 2024-07-18 ENCOUNTER — Other Ambulatory Visit: Payer: Self-pay | Admitting: Cardiology

## 2024-07-18 ENCOUNTER — Ambulatory Visit: Payer: Self-pay | Admitting: Cardiology

## 2024-07-18 ENCOUNTER — Ambulatory Visit (HOSPITAL_COMMUNITY)
Admission: RE | Admit: 2024-07-18 | Discharge: 2024-07-18 | Disposition: A | Discharge status: Home / Self Care | Source: Ambulatory Visit | Attending: Cardiology | Admitting: Cardiology

## 2024-07-18 DIAGNOSIS — I5031 Acute diastolic (congestive) heart failure: Secondary | ICD-10-CM | POA: Insufficient documentation

## 2024-07-18 MED ORDER — GADOBUTROL 1 MMOL/ML IV SOLN
10.0000 mL | Freq: Once | INTRAVENOUS | Status: AC | PRN
  Administered 2024-07-18: 10 mL via INTRAVENOUS

## 2024-09-01 NOTE — Progress Notes (Signed)
 Orthopaedic Surgery Hand and Upper Extremity History and Physical Examination  CC: Right index MCP joint pain  HPI 09/01/2024: History of Present Illness The patient, a 73 year old male and former patient of Dr. Donnice Robinsons, presents with right hand pain.  He reports experiencing intermittent pain in the right index finger, which occasionally disrupts his sleep. The pain is particularly intense around the metacarpophalangeal joint and extends along the finger. He denies any paresthesia. Additionally, he describes tenderness at the base of both thumbs and discomfort with movement. He does not report any triggering or catching phenomena in the fingers. 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 He is right-handed and has previously received one corticosteroid injection. He has no history of diabetes mellitus.  Supplemental Information In July, the patient experienced an infected gallbladder leading to sepsis.   Problem List:  Problem List[1]  Past Medical History: Medical History[2]   Medications: Current Rx ordered in [facility][3]  Allergies: Allergies as of 09/01/2024  . (No Known Allergies)    Past Surgical History: Surgical History[4]   Social History: Social History   Occupational History  . Not on file  Tobacco Use  . Smoking status: Every Day    Current packs/day: 20.00    Types: Cigarettes  . Smokeless tobacco: Never  Substance and Sexual Activity  . Alcohol use: Not Currently  . Drug use: Not Currently  . Sexual activity: Not on file     Family History: Family History[5] Otherwise, no relevant orthopaedic family history  ROS: Review of Systems: All systems reviewed and are negative except that mentioned in HPI  Work/Sport/Hobbies: See HPI  Physical  Examination: Vitals:   09/01/24 1353  BP: 128/72  Pulse:   Temp:    Constitutional: Awake, alert.  WN/WD Appearance: healthy, no acute distress, well-groomed Affect: Normal HEENT: EOMI, mucous membranes moist CV: RRR Pulm: breathing comfortably  Right Upper Extremity / Hand Physical Exam Right upper extremity is neurovascularly intact bilaterally. There is tenderness to palpation at the thumb [facility] joint as well as the index MCP joint. No triggering or catching of that finger. Tenderness is globally about the MCP joint with no worsening on the A1 pulley versus the remainder of the joint.  Results: Results Imaging Plain film radiographs of the right hand demonstrate severe degenerative changes of the thumb CMC joint with joint space narrowing and osteophyte formation as well as subchondral sclerosis and subchondral cyst formation. Additionally, there are degenerative changes of the second MCP joint with joint space narrowing, subchondral sclerosis and osteophyte formation. The second DIP joint and third DIP joint have mild degenerative changes with joint space narrowing and osteophyte formation.    Assessment/Plan:  Assessment & Plan 1. Right hand pain - Severe degenerative changes in the right hand, particularly at the thumb [facility] joint and the second MCP joint, evidenced by plain film radiographs - Reports pain in the index finger that sometimes wakes him up at night and shoots up through the knuckle - Tenderness to palpation at the thumb [facility] joint and the index MCP joint - Previous injection in the PIP joint of the index finger provided relief - Recommend another injection in the index finger MCP joint for further relief - Discussed risks and benefits of the injection (potential for infection, cartilage damage, temporary skin depigmentation) - Patient agreed to proceed with the  injection - If injection does not provide sufficient relief, joint replacement with a silicone implant may  be considered  Follow-up - Patient will follow up as needed    Marsa HERO. Chiaramonti, MD Hand and Upper Extremity Surgery [facility] Department of Orthopaedic Surgery [facility] 09/01/2024 2:30 PM      [1] There is no problem list on file for this patient. [2] History reviewed. No pertinent past medical history. [3] Meds Ordered in [facility]  Medication Sig Dispense Refill  . amLODIPine (NORVASC) 5 mg tablet TAKE ONE TABLET BY MOUTH DAILY FOR HIGH FOR BLOOD PRESSURE    . atorvastatin  (LIPITOR) 10 mg tablet Take 1 tablet by mouth daily.    . furosemide  (LASIX ) 20 mg tablet Take 20 mg by mouth.    . lisinopriL -hydrochlorothiazide  (PRINZIDE ) 20-25 mg per tablet     . metoprolol  tartrate (LOPRESSOR ) 25 mg tablet Take 25 mg by mouth.    . pantoprazole  (PROTONIX ) 40 mg EC tablet Take 40 mg by mouth.    . potassium chloride  (KLOR-CON ) 10 mEq ER tablet     . spironolactone  (ALDACTONE ) 25 mg tablet Take 25 mg by mouth daily.    . thiamine  (VITAMIN B1) 100 mg tablet Take 100 mg by mouth.     No current Epic-ordered facility-administered medications on file.  [4] History reviewed. No pertinent surgical history. [5] Family History Problem Relation Name Age of Onset  . Arthritis Sister

## 2024-09-21 NOTE — Progress Notes (Unsigned)
 Cardiology Office Note:   Date:  09/22/2024  ID:  Tony Lopez, DOB Mar 21, 1951, MRN [id number] PCP: Alvera Reagin, PA  [facility] Providers Cardiologist:  Lynwood Schilling, MD {  History of Present Illness:   Tony Lopez is a 73 y.o. male I saw him in June in the hospital.  He presented to the emergency room with some left leg and arm swelling.  He was treated with IV Lasix .  EF was well-preserved ejection fraction.  He was thought to have acute diastolic heart failure.  He was diuresed.  Of note he did have a lipomatous interatrial septum and on follow-up MRI seem to have some compression of the superior vena cava at the SVC/RA junction.  The patient had had a complicated polymicrobial bacteremia secondary to choledocholithiasis with subtotal cholecystectomy in May of this year.  He later received a biliary stent.  He has renal cell cancer and apparently will need to have heminephrectomy he thinks in about 6 months.  Again let him recover from everything else first.  He is not drinking alcohol.  He still smoking cigarettes.  He has been feeling relatively well.  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  He gives the eggs to his ex-wife.  He denies any new cardiovascular symptoms.  He is limited because of knee pain.  He does still get around his 3 acres.  He does not have chest pressure, neck or arm discomfort.  He has no new shortness of breath, PND or orthopnea.  He had none of the arm swelling that he did have.   ROS: As stated in the HPI and negative for all other systems.  Studies Reviewed:    EKG:     NA        Physical Exam:   VS:  BP (!) 144/70 (BP Location: Left Arm, Patient Position: Sitting, Cuff Size: Normal)   Pulse 70   Ht 5' 11 (1.803 m)   Wt 195 lb (88.5 kg)   SpO2 99%   BMI 27.20 kg/m    Wt Readings from Last 3 Encounters:  09/22/24 195 lb (88.5 kg)  06/20/24 181 lb 9.6 oz (82.4 kg)  05/23/24 181 lb 14.1 oz (82.5 kg)     GEN: Well nourished, well  developed in no acute distress NECK: No JVD; No carotid bruits CARDIAC: RRR, no murmurs, rubs, gallops RESPIRATORY:  Clear to auscultation without rales, wheezing or rhonchi  ABDOMEN: Soft, non-tender, non-distended EXTREMITIES:  No edema; No deformity   ASSESSMENT AND PLAN:   Diastolic heart failure: He seems to be euvolemic.  No change in therapy.  Dilation of ascending aorta: A follow-up with a CT aortic gram in June 2026.  As below if he needs preop clearance prior to this I would probably do a coronary CTA and look at his aorta at the same time.  I think the timing would be reasonable for preop.  He will let me know when preop is scheduled.   Lipomatous hypertrophy of the intra-atrial septum:    MRI was done and there is some mild asymmetric LVH of the basal septum and compression of the SVC junction secondary to the lipomatous septum.  No further imaging or management of this is indicated at this point.    Tobacco use: He has been educated about the need to stop smoking.  He is going to look at getting nicotine  gum   Alcohol use: He did have alcohol withdrawal in May of this year.  He  is not drinking.   Left arm swelling: This has resolved.  No change in therapy.    Preop: He will let us  know when he is going to have surgery and we will need to see him preop and he will need the CT as above.  Follow up with me in 1 year or sooner if he needs preop clearance.  Signed, Lynwood Schilling, MD

## 2024-09-22 ENCOUNTER — Ambulatory Visit: Attending: Cardiology | Admitting: Cardiology

## 2024-09-22 ENCOUNTER — Encounter: Payer: Self-pay | Admitting: Cardiology

## 2024-09-22 VITALS — BP 144/70 | HR 70 | Ht 71.0 in | Wt 195.0 lb

## 2024-09-22 DIAGNOSIS — I5032 Chronic diastolic (congestive) heart failure: Secondary | ICD-10-CM

## 2024-09-22 DIAGNOSIS — Z720 Tobacco use: Secondary | ICD-10-CM | POA: Diagnosis not present

## 2024-09-22 DIAGNOSIS — F1090 Alcohol use, unspecified, uncomplicated: Secondary | ICD-10-CM

## 2024-09-22 DIAGNOSIS — I77810 Thoracic aortic ectasia: Secondary | ICD-10-CM

## 2024-09-22 NOTE — Patient Instructions (Signed)
